# Patient Record
Sex: Male | Born: 1945
Health system: Southern US, Community
[De-identification: ages and names within clinical notes are randomized; demographics above are authoritative.]

## PROBLEM LIST (undated history)

## (undated) DIAGNOSIS — N400 Enlarged prostate without lower urinary tract symptoms: Secondary | ICD-10-CM

## (undated) DIAGNOSIS — H3530 Unspecified macular degeneration: Secondary | ICD-10-CM

## (undated) DIAGNOSIS — M109 Gout, unspecified: Secondary | ICD-10-CM

## (undated) DIAGNOSIS — E785 Hyperlipidemia, unspecified: Secondary | ICD-10-CM

## (undated) DIAGNOSIS — T8859XA Other complications of anesthesia, initial encounter: Secondary | ICD-10-CM

## (undated) DIAGNOSIS — J302 Other seasonal allergic rhinitis: Secondary | ICD-10-CM

## (undated) DIAGNOSIS — R339 Retention of urine, unspecified: Secondary | ICD-10-CM

## (undated) DIAGNOSIS — K219 Gastro-esophageal reflux disease without esophagitis: Secondary | ICD-10-CM

## (undated) DIAGNOSIS — I2699 Other pulmonary embolism without acute cor pulmonale: Secondary | ICD-10-CM

## (undated) DIAGNOSIS — M069 Rheumatoid arthritis, unspecified: Secondary | ICD-10-CM

## (undated) DIAGNOSIS — T4145XA Adverse effect of unspecified anesthetic, initial encounter: Secondary | ICD-10-CM

## (undated) DIAGNOSIS — M1990 Unspecified osteoarthritis, unspecified site: Secondary | ICD-10-CM

## (undated) DIAGNOSIS — K589 Irritable bowel syndrome without diarrhea: Secondary | ICD-10-CM

## (undated) DIAGNOSIS — I10 Essential (primary) hypertension: Secondary | ICD-10-CM

## (undated) HISTORY — PX: ADENOIDECTOMY: SUR15

## (undated) HISTORY — PX: APPENDECTOMY: SHX54

## (undated) HISTORY — PX: OTHER SURGICAL HISTORY: SHX169

## (undated) HISTORY — PX: TONSILLECTOMY: SUR1361

## (undated) HISTORY — PX: LUMBAR LAMINECTOMY: SHX95

## (undated) HISTORY — DX: Unspecified osteoarthritis, unspecified site: M19.90

## (undated) HISTORY — DX: Gout, unspecified: M10.9

## (undated) HISTORY — DX: Gastro-esophageal reflux disease without esophagitis: K21.9

## (undated) HISTORY — DX: Benign prostatic hyperplasia without lower urinary tract symptoms: N40.0

## (undated) HISTORY — DX: Hyperlipidemia, unspecified: E78.5

## (undated) HISTORY — DX: Unspecified macular degeneration: H35.30

## (undated) HISTORY — DX: Rheumatoid arthritis, unspecified: M06.9

## (undated) HISTORY — PX: CHOLECYSTECTOMY: SHX55

---

## 1998-06-29 ENCOUNTER — Ambulatory Visit (HOSPITAL_BASED_OUTPATIENT_CLINIC_OR_DEPARTMENT_OTHER): Admission: RE | Admit: 1998-06-29 | Discharge: 1998-06-29 | Payer: Self-pay | Admitting: General Surgery

## 1998-10-19 ENCOUNTER — Other Ambulatory Visit: Admission: RE | Admit: 1998-10-19 | Discharge: 1998-10-19 | Payer: Self-pay | Admitting: Gastroenterology

## 1998-12-13 ENCOUNTER — Encounter: Payer: Self-pay | Admitting: Orthopedic Surgery

## 1998-12-13 ENCOUNTER — Ambulatory Visit (HOSPITAL_COMMUNITY): Admission: RE | Admit: 1998-12-13 | Discharge: 1998-12-13 | Payer: Self-pay | Admitting: Orthopedic Surgery

## 1998-12-27 ENCOUNTER — Encounter: Payer: Self-pay | Admitting: Orthopedic Surgery

## 1998-12-27 ENCOUNTER — Ambulatory Visit (HOSPITAL_COMMUNITY): Admission: RE | Admit: 1998-12-27 | Discharge: 1998-12-27 | Payer: Self-pay | Admitting: Orthopedic Surgery

## 2000-01-15 ENCOUNTER — Encounter: Payer: Self-pay | Admitting: Emergency Medicine

## 2000-01-15 ENCOUNTER — Emergency Department (HOSPITAL_COMMUNITY): Admission: EM | Admit: 2000-01-15 | Discharge: 2000-01-15 | Payer: Self-pay | Admitting: Emergency Medicine

## 2000-04-14 ENCOUNTER — Ambulatory Visit (HOSPITAL_COMMUNITY): Admission: RE | Admit: 2000-04-14 | Discharge: 2000-04-14 | Payer: Self-pay | Admitting: *Deleted

## 2001-04-13 ENCOUNTER — Ambulatory Visit (HOSPITAL_COMMUNITY): Admission: RE | Admit: 2001-04-13 | Discharge: 2001-04-13 | Payer: Self-pay | Admitting: Urology

## 2001-04-13 ENCOUNTER — Encounter: Payer: Self-pay | Admitting: Urology

## 2001-04-24 ENCOUNTER — Emergency Department (HOSPITAL_COMMUNITY): Admission: EM | Admit: 2001-04-24 | Discharge: 2001-04-24 | Payer: Self-pay | Admitting: Emergency Medicine

## 2001-09-30 ENCOUNTER — Encounter: Payer: Self-pay | Admitting: Urology

## 2001-09-30 ENCOUNTER — Ambulatory Visit (HOSPITAL_COMMUNITY): Admission: RE | Admit: 2001-09-30 | Discharge: 2001-09-30 | Payer: Self-pay | Admitting: Urology

## 2002-02-17 ENCOUNTER — Ambulatory Visit (HOSPITAL_COMMUNITY): Admission: RE | Admit: 2002-02-17 | Discharge: 2002-02-17 | Payer: Self-pay | Admitting: Urology

## 2002-02-17 ENCOUNTER — Encounter: Payer: Self-pay | Admitting: Urology

## 2002-05-31 ENCOUNTER — Encounter: Admission: RE | Admit: 2002-05-31 | Discharge: 2002-05-31 | Payer: Self-pay | Admitting: *Deleted

## 2004-09-05 ENCOUNTER — Ambulatory Visit: Payer: Self-pay | Admitting: Family Medicine

## 2005-02-04 ENCOUNTER — Ambulatory Visit: Payer: Self-pay | Admitting: Family Medicine

## 2005-07-10 ENCOUNTER — Ambulatory Visit: Payer: Self-pay | Admitting: Family Medicine

## 2005-09-04 ENCOUNTER — Ambulatory Visit: Payer: Self-pay | Admitting: Family Medicine

## 2005-11-12 ENCOUNTER — Ambulatory Visit: Payer: Self-pay | Admitting: Family Medicine

## 2006-01-01 ENCOUNTER — Ambulatory Visit: Payer: Self-pay | Admitting: Family Medicine

## 2006-03-04 ENCOUNTER — Ambulatory Visit: Payer: Self-pay | Admitting: Family Medicine

## 2006-06-09 ENCOUNTER — Ambulatory Visit: Payer: Self-pay | Admitting: Family Medicine

## 2006-07-07 ENCOUNTER — Ambulatory Visit: Payer: Self-pay | Admitting: Family Medicine

## 2006-09-24 ENCOUNTER — Ambulatory Visit: Payer: Self-pay | Admitting: Family Medicine

## 2006-09-24 LAB — CONVERTED CEMR LAB
Chol/HDL Ratio, serum: 3.2
Cholesterol: 147 mg/dL (ref 0–200)
Creatinine, Ser: 1.6 mg/dL — ABNORMAL HIGH (ref 0.4–1.5)
Glucose, Bld: 88 mg/dL (ref 70–99)
HDL: 45.3 mg/dL (ref >39.0)
Hemoglobin: 16.2 g/dL (ref 13.0–17.0)
Monocytes Absolute: 0.7 10*3/uL (ref 0.2–0.7)
PSA: 1.91 ng/mL (ref 0.10–4.00)
Platelets: 228 10*3/uL (ref 150–400)
Sodium: 140 meq/L (ref 135–145)
TSH: 3.59 microintl units/mL (ref 0.35–5.50)
VLDL: 33 mg/dL (ref 0–40)

## 2007-02-23 ENCOUNTER — Ambulatory Visit: Payer: Self-pay | Admitting: Family Medicine

## 2007-04-28 ENCOUNTER — Encounter: Payer: Self-pay | Admitting: Family Medicine

## 2007-05-10 ENCOUNTER — Ambulatory Visit (HOSPITAL_COMMUNITY): Admission: RE | Admit: 2007-05-10 | Discharge: 2007-05-10 | Payer: Self-pay | Admitting: Urology

## 2007-05-31 ENCOUNTER — Encounter (INDEPENDENT_AMBULATORY_CARE_PROVIDER_SITE_OTHER): Payer: Self-pay

## 2007-06-25 ENCOUNTER — Encounter: Payer: Self-pay | Admitting: Family Medicine

## 2007-07-13 ENCOUNTER — Telehealth: Payer: Self-pay | Admitting: Family Medicine

## 2007-08-25 ENCOUNTER — Telehealth: Payer: Self-pay | Admitting: Family Medicine

## 2007-09-07 ENCOUNTER — Ambulatory Visit: Payer: Self-pay | Admitting: Family Medicine

## 2007-09-07 DIAGNOSIS — M129 Arthropathy, unspecified: Secondary | ICD-10-CM | POA: Insufficient documentation

## 2007-09-07 DIAGNOSIS — T50995A Adverse effect of other drugs, medicaments and biological substances, initial encounter: Secondary | ICD-10-CM | POA: Insufficient documentation

## 2007-09-07 DIAGNOSIS — E785 Hyperlipidemia, unspecified: Secondary | ICD-10-CM | POA: Insufficient documentation

## 2007-09-07 DIAGNOSIS — E039 Hypothyroidism, unspecified: Secondary | ICD-10-CM | POA: Insufficient documentation

## 2007-09-24 ENCOUNTER — Ambulatory Visit: Payer: Self-pay | Admitting: Family Medicine

## 2007-09-28 ENCOUNTER — Ambulatory Visit: Payer: Self-pay | Admitting: Family Medicine

## 2007-09-28 DIAGNOSIS — K219 Gastro-esophageal reflux disease without esophagitis: Secondary | ICD-10-CM | POA: Insufficient documentation

## 2007-10-24 LAB — CONVERTED CEMR LAB
AST: 23 units/L (ref 0–37)
Albumin: 4.5 g/dL (ref 3.5–5.2)
Basophils Relative: 0 % (ref 0.0–1.0)
Chloride: 103 meq/L (ref 96–112)
Cholesterol: 134 mg/dL (ref 0–200)
Creatinine, Ser: 1.8 mg/dL — ABNORMAL HIGH (ref 0.4–1.5)
GFR calc Af Amer: 49 mL/min
Glucose, Bld: 83 mg/dL (ref 70–99)
HCT: 51.3 % (ref 39.0–52.0)
HDL: 50 mg/dL (ref >39.0)
LDL Cholesterol: 61 mg/dL (ref 0–99)
Monocytes Absolute: 0.6 10*3/uL (ref 0.2–0.7)
Monocytes Relative: 7.8 % (ref 3.0–11.0)
Platelets: 183 10*3/uL (ref 150–400)
RBC: 5.71 M/uL (ref 4.22–5.81)
Sodium: 141 meq/L (ref 135–145)
TSH: 1.85 microintl units/mL (ref 0.35–5.50)
Total Bilirubin: 2.2 mg/dL — ABNORMAL HIGH (ref 0.3–1.2)
Total CHOL/HDL Ratio: 2.7
Total Protein: 7 g/dL (ref 6.0–8.3)
Triglycerides: 115 mg/dL (ref 0–149)
WBC: 8 10*3/uL (ref 4.5–10.5)

## 2007-10-28 ENCOUNTER — Telehealth: Payer: Self-pay | Admitting: Family Medicine

## 2007-11-30 ENCOUNTER — Telehealth: Payer: Self-pay | Admitting: Family Medicine

## 2007-12-15 ENCOUNTER — Ambulatory Visit: Payer: Self-pay | Admitting: Family Medicine

## 2007-12-15 DIAGNOSIS — G4700 Insomnia, unspecified: Secondary | ICD-10-CM | POA: Insufficient documentation

## 2007-12-15 DIAGNOSIS — J45909 Unspecified asthma, uncomplicated: Secondary | ICD-10-CM | POA: Insufficient documentation

## 2008-02-09 ENCOUNTER — Ambulatory Visit: Payer: Self-pay | Admitting: Family Medicine

## 2008-02-29 ENCOUNTER — Telehealth: Payer: Self-pay | Admitting: Family Medicine

## 2008-03-22 ENCOUNTER — Telehealth: Payer: Self-pay | Admitting: Family Medicine

## 2008-04-03 ENCOUNTER — Encounter: Payer: Self-pay | Admitting: Family Medicine

## 2008-04-12 ENCOUNTER — Ambulatory Visit: Payer: Self-pay | Admitting: Family Medicine

## 2008-05-30 ENCOUNTER — Ambulatory Visit: Payer: Self-pay | Admitting: Family Medicine

## 2008-05-30 DIAGNOSIS — Z87442 Personal history of urinary calculi: Secondary | ICD-10-CM | POA: Insufficient documentation

## 2008-06-13 ENCOUNTER — Telehealth: Payer: Self-pay | Admitting: Family Medicine

## 2008-07-20 ENCOUNTER — Ambulatory Visit: Payer: Self-pay | Admitting: Family Medicine

## 2008-07-20 DIAGNOSIS — E291 Testicular hypofunction: Secondary | ICD-10-CM | POA: Insufficient documentation

## 2008-09-14 ENCOUNTER — Ambulatory Visit: Payer: Self-pay | Admitting: Family Medicine

## 2008-09-14 DIAGNOSIS — M549 Dorsalgia, unspecified: Secondary | ICD-10-CM | POA: Insufficient documentation

## 2008-09-20 ENCOUNTER — Encounter: Payer: Self-pay | Admitting: Family Medicine

## 2008-09-27 ENCOUNTER — Ambulatory Visit: Payer: Self-pay | Admitting: Family Medicine

## 2008-10-24 ENCOUNTER — Telehealth: Payer: Self-pay | Admitting: Family Medicine

## 2008-10-31 ENCOUNTER — Ambulatory Visit: Payer: Self-pay | Admitting: Family Medicine

## 2008-10-31 DIAGNOSIS — H109 Unspecified conjunctivitis: Secondary | ICD-10-CM | POA: Insufficient documentation

## 2008-11-09 ENCOUNTER — Telehealth: Payer: Self-pay | Admitting: Family Medicine

## 2008-12-07 ENCOUNTER — Telehealth: Payer: Self-pay | Admitting: Family Medicine

## 2008-12-21 ENCOUNTER — Ambulatory Visit: Payer: Self-pay | Admitting: Family Medicine

## 2009-01-02 ENCOUNTER — Telehealth: Payer: Self-pay | Admitting: Family Medicine

## 2009-01-08 ENCOUNTER — Encounter: Payer: Self-pay | Admitting: Family Medicine

## 2009-01-09 ENCOUNTER — Telehealth: Payer: Self-pay | Admitting: Family Medicine

## 2009-02-01 ENCOUNTER — Ambulatory Visit: Payer: Self-pay | Admitting: Family Medicine

## 2009-02-21 ENCOUNTER — Telehealth: Payer: Self-pay | Admitting: Family Medicine

## 2009-03-08 ENCOUNTER — Telehealth: Payer: Self-pay | Admitting: Family Medicine

## 2009-03-13 ENCOUNTER — Ambulatory Visit: Payer: Self-pay | Admitting: Family Medicine

## 2009-03-13 DIAGNOSIS — F411 Generalized anxiety disorder: Secondary | ICD-10-CM | POA: Insufficient documentation

## 2009-03-13 DIAGNOSIS — I10 Essential (primary) hypertension: Secondary | ICD-10-CM | POA: Insufficient documentation

## 2009-03-14 ENCOUNTER — Telehealth: Payer: Self-pay | Admitting: Family Medicine

## 2009-03-14 ENCOUNTER — Encounter: Payer: Self-pay | Admitting: Family Medicine

## 2009-03-21 ENCOUNTER — Ambulatory Visit: Payer: Self-pay | Admitting: Family Medicine

## 2009-04-11 ENCOUNTER — Telehealth: Payer: Self-pay | Admitting: Family Medicine

## 2009-04-18 ENCOUNTER — Ambulatory Visit: Payer: Self-pay | Admitting: Family Medicine

## 2009-05-02 ENCOUNTER — Telehealth (INDEPENDENT_AMBULATORY_CARE_PROVIDER_SITE_OTHER): Payer: Self-pay | Admitting: *Deleted

## 2009-05-09 ENCOUNTER — Telehealth: Payer: Self-pay | Admitting: Family Medicine

## 2009-05-31 ENCOUNTER — Ambulatory Visit: Payer: Self-pay | Admitting: Family Medicine

## 2009-05-31 DIAGNOSIS — M19079 Primary osteoarthritis, unspecified ankle and foot: Secondary | ICD-10-CM | POA: Insufficient documentation

## 2009-06-04 ENCOUNTER — Telehealth (INDEPENDENT_AMBULATORY_CARE_PROVIDER_SITE_OTHER): Payer: Self-pay | Admitting: *Deleted

## 2009-06-12 ENCOUNTER — Encounter: Payer: Self-pay | Admitting: Family Medicine

## 2009-06-28 ENCOUNTER — Ambulatory Visit: Payer: Self-pay | Admitting: Family Medicine

## 2009-07-31 ENCOUNTER — Telehealth: Payer: Self-pay | Admitting: Family Medicine

## 2009-08-04 ENCOUNTER — Encounter: Payer: Self-pay | Admitting: Family Medicine

## 2009-09-06 ENCOUNTER — Telehealth: Payer: Self-pay | Admitting: Family Medicine

## 2009-09-11 ENCOUNTER — Ambulatory Visit: Payer: Self-pay | Admitting: Family Medicine

## 2009-10-06 HISTORY — PX: HERNIA REPAIR: SHX51

## 2009-11-20 ENCOUNTER — Telehealth: Payer: Self-pay | Admitting: Family Medicine

## 2009-11-27 ENCOUNTER — Ambulatory Visit: Payer: Self-pay | Admitting: Family Medicine

## 2009-11-27 DIAGNOSIS — M949 Disorder of cartilage, unspecified: Secondary | ICD-10-CM

## 2009-11-27 DIAGNOSIS — M899 Disorder of bone, unspecified: Secondary | ICD-10-CM | POA: Insufficient documentation

## 2009-11-29 ENCOUNTER — Telehealth (INDEPENDENT_AMBULATORY_CARE_PROVIDER_SITE_OTHER): Payer: Self-pay | Admitting: *Deleted

## 2009-12-12 ENCOUNTER — Encounter: Payer: Self-pay | Admitting: Family Medicine

## 2009-12-13 ENCOUNTER — Telehealth: Payer: Self-pay | Admitting: Family Medicine

## 2009-12-26 ENCOUNTER — Ambulatory Visit: Payer: Self-pay | Admitting: Family Medicine

## 2009-12-27 LAB — CONVERTED CEMR LAB
CO2: 30 meq/L (ref 19–32)
Calcium: 9.4 mg/dL (ref 8.4–10.5)
Glucose, Bld: 91 mg/dL (ref 70–99)
Potassium: 4.4 meq/L (ref 3.5–5.1)

## 2010-01-01 ENCOUNTER — Telehealth: Payer: Self-pay | Admitting: Family Medicine

## 2010-01-02 ENCOUNTER — Encounter: Payer: Self-pay | Admitting: Family Medicine

## 2010-01-23 ENCOUNTER — Telehealth: Payer: Self-pay | Admitting: Family Medicine

## 2010-01-31 ENCOUNTER — Telehealth: Payer: Self-pay | Admitting: Family Medicine

## 2010-02-12 ENCOUNTER — Ambulatory Visit: Payer: Self-pay | Admitting: Family Medicine

## 2010-02-12 DIAGNOSIS — R0789 Other chest pain: Secondary | ICD-10-CM | POA: Insufficient documentation

## 2010-02-12 DIAGNOSIS — M479 Spondylosis, unspecified: Secondary | ICD-10-CM | POA: Insufficient documentation

## 2010-04-02 ENCOUNTER — Ambulatory Visit: Payer: Self-pay | Admitting: Family Medicine

## 2010-04-02 DIAGNOSIS — K5900 Constipation, unspecified: Secondary | ICD-10-CM | POA: Insufficient documentation

## 2010-04-02 DIAGNOSIS — IMO0001 Reserved for inherently not codable concepts without codable children: Secondary | ICD-10-CM | POA: Insufficient documentation

## 2010-04-02 DIAGNOSIS — M791 Myalgia, unspecified site: Secondary | ICD-10-CM | POA: Insufficient documentation

## 2010-04-05 ENCOUNTER — Encounter: Payer: Self-pay | Admitting: Family Medicine

## 2010-04-11 ENCOUNTER — Ambulatory Visit: Payer: Self-pay | Admitting: Family Medicine

## 2010-04-11 DIAGNOSIS — J309 Allergic rhinitis, unspecified: Secondary | ICD-10-CM | POA: Insufficient documentation

## 2010-04-11 DIAGNOSIS — E559 Vitamin D deficiency, unspecified: Secondary | ICD-10-CM | POA: Insufficient documentation

## 2010-04-11 DIAGNOSIS — E299 Testicular dysfunction, unspecified: Secondary | ICD-10-CM | POA: Insufficient documentation

## 2010-04-11 LAB — CONVERTED CEMR LAB
Bilirubin Urine: NEGATIVE
Glucose, Urine, Semiquant: NEGATIVE
Ketones, urine, test strip: NEGATIVE
Nitrite: NEGATIVE
Protein, U semiquant: NEGATIVE
Specific Gravity, Urine: 1.02
Urobilinogen, UA: 0.2
pH: 6

## 2010-04-18 LAB — CONVERTED CEMR LAB
PSA: 2.05 ng/mL (ref 0.10–4.00)
Testosterone: 212.93 ng/dL — ABNORMAL LOW (ref 350.00–890.00)
Uric Acid, Serum: 7.4 mg/dL (ref 4.0–7.8)
Vit D, 25-Hydroxy: 38 ng/mL (ref 30–89)

## 2010-04-23 ENCOUNTER — Telehealth: Payer: Self-pay | Admitting: Family Medicine

## 2010-05-16 ENCOUNTER — Ambulatory Visit: Payer: Self-pay | Admitting: Family Medicine

## 2010-05-16 DIAGNOSIS — H1030 Unspecified acute conjunctivitis, unspecified eye: Secondary | ICD-10-CM | POA: Insufficient documentation

## 2010-05-16 LAB — CONVERTED CEMR LAB
Bilirubin Urine: NEGATIVE
Nitrite: NEGATIVE

## 2010-05-28 ENCOUNTER — Ambulatory Visit: Payer: Self-pay | Admitting: Family Medicine

## 2010-05-28 DIAGNOSIS — R609 Edema, unspecified: Secondary | ICD-10-CM | POA: Insufficient documentation

## 2010-05-28 DIAGNOSIS — K589 Irritable bowel syndrome without diarrhea: Secondary | ICD-10-CM | POA: Insufficient documentation

## 2010-06-20 ENCOUNTER — Ambulatory Visit: Payer: Self-pay | Admitting: Family Medicine

## 2010-06-20 LAB — CONVERTED CEMR LAB
Bilirubin Urine: NEGATIVE
Glucose, Urine, Semiquant: NEGATIVE
Protein, U semiquant: NEGATIVE
WBC Urine, dipstick: NEGATIVE
pH: 5

## 2010-06-28 ENCOUNTER — Encounter: Payer: Self-pay | Admitting: Family Medicine

## 2010-07-23 ENCOUNTER — Ambulatory Visit: Payer: Self-pay | Admitting: Family Medicine

## 2010-07-23 DIAGNOSIS — R351 Nocturia: Secondary | ICD-10-CM | POA: Insufficient documentation

## 2010-07-23 DIAGNOSIS — M25519 Pain in unspecified shoulder: Secondary | ICD-10-CM | POA: Insufficient documentation

## 2010-08-02 ENCOUNTER — Telehealth (INDEPENDENT_AMBULATORY_CARE_PROVIDER_SITE_OTHER): Payer: Self-pay | Admitting: *Deleted

## 2010-08-07 ENCOUNTER — Encounter: Payer: Self-pay | Admitting: Family Medicine

## 2010-09-06 ENCOUNTER — Telehealth: Payer: Self-pay | Admitting: Family Medicine

## 2010-09-12 ENCOUNTER — Encounter: Payer: Self-pay | Admitting: Family Medicine

## 2010-09-19 ENCOUNTER — Telehealth: Payer: Self-pay | Admitting: Family Medicine

## 2010-10-15 ENCOUNTER — Ambulatory Visit
Admission: RE | Admit: 2010-10-15 | Discharge: 2010-10-15 | Payer: Self-pay | Source: Home / Self Care | Attending: Internal Medicine | Admitting: Internal Medicine

## 2010-10-27 ENCOUNTER — Encounter: Payer: Self-pay | Admitting: Family Medicine

## 2010-10-29 ENCOUNTER — Telehealth: Payer: Self-pay | Admitting: Family Medicine

## 2010-11-05 NOTE — Progress Notes (Signed)
Summary: wants to speak with Dr Abner Greenspan  Phone Note Call from Patient Call back at Home Phone [phone]   Caller: Patient---LIVE CALL Reason for Call: Talk to Doctor Summary of Call: wants to speak to Dr Abner Greenspan only. please return call. No further message was given. Initial call taken by: Warnell Forester,  August 02, 2010 2:31 PM  Follow-up for Phone Call        Can you check with patient and see what he needs Follow-up by: Danise Edge MD,  August 02, 2010 3:31 PM  Additional Follow-up for Phone Call Additional follow up Details #1::        Pt states stool is hard with a little blood. Pt states Dr Scotty Court prescribed a stool softner for him and would like to know if something could be called in? Pt also states the patches that were prescribed for him only help for a little while? Pt also states the Rapaflo upsets his stomach? Pt has Flomax at home and would like to know if he can use this instead? Additional Follow-up by: Josph Macho RMA,  August 02, 2010 4:32 PM    Additional Follow-up for Phone Call Additional follow up Details #2::    He needs to come in to discuss so many things for the hard stool, OTC Senna S 2 caps daily. Add Benefiber powder 2 tsp by mouth daily and may use Anusol HC supp, OTC two times a day whenever he sees blood or has  hard pain ful stool Follow-up by: Danise Edge MD,  August 02, 2010 5:13 PM  Additional Follow-up for Phone Call Additional follow up Details #3:: Details for Additional Follow-up Action Taken: Left vm for pt to return my call. Additional Follow-up by: Josph Macho RMA,  August 05, 2010 9:04 AM  Pt informed and pt would like to know if any md in the office would give him a lidocaine and depo injection in his shoulder? Pt states he has an Ortho MD but it might take 2 weeks to get into there? After speaking with Tim Lair its better if pt just sees his Ortho MD for this.  Pt informed and understands/ CF

## 2010-11-05 NOTE — Progress Notes (Signed)
Summary: rx lorazepam   Phone Note From Pharmacy   [facility] [phone]* Reason for Call: Needs renewal Summary of Call: refill lorazepam  Initial call taken by: Pura Spice, RN,  January 23, 2010 3:38 PM  Follow-up for Phone Call        ok x 5  per dr Scotty Court  Follow-up by: Pura Spice, RN,  January 23, 2010 3:38 PM    New/Updated Medications: LORAZEPAM 0.5 MG TABS (LORAZEPAM) 1 three times a day for stress Prescriptions: LORAZEPAM 0.5 MG TABS (LORAZEPAM) 1 three times a day for stress  #90 x 5   Entered by:   Pura Spice, RN   Authorized by:   Judithann Sheen MD   Signed by:   Pura Spice, RN on 01/23/2010   Method used:   Telephoned to ...       [facility]  [address] [phone]* (retail)       [address]       [place], [place]  [address]       Ph: [phone] or [phone]       Fax: [phone]   RxID:   [id number]

## 2010-11-05 NOTE — Letter (Signed)
Summary: [facility]   Imported By: Lanelle Bal 08/16/2010 09:11:21  _____________________________________________________________________  External Attachment:    Type:   Image     Comment:   External Document

## 2010-11-05 NOTE — Letter (Signed)
Summary: [facility]  [facility]   Imported By: Maryln Gottron 07/10/2010 10:04:13  _____________________________________________________________________  External Attachment:    Type:   Image     Comment:   External Document

## 2010-11-05 NOTE — Assessment & Plan Note (Signed)
Summary: BILATERAL LEG PAIN // RS   Vital Signs:  Patient profile:   65 year old male Weight:      199 pounds O2 Sat:      98 % Temp:     98 degrees F Pulse rate:   88 / minute Pulse rhythm:   regular BP sitting:   130 / 80  (left arm)  Vitals Entered By: Pura Spice, RN (May 28, 2010 11:42 AM) CC: bilateral leg pain thinks his ankles swelling.    History of Present Illness: This 65 year old white married male is in today complaining of pain in his lower back his hips knees and I do over the past week he went on a vacation to the [place]. [place] and [place] and a considerable amount of walking have a great time but also in began having some pain of the joints His other complaint is that of episodes of diarrhea on a rather frequent and had hyoscymine in the past which has helped to control his problem Desire for a prescription for Lunesta 3 mg for insomnia Does have some peripheral edema  Allergies: 1)  ! Sudafed 2)  ! Codeine 3)  ! Zocor 4)  ! Demerol 5)  ! Caffeine  Past History:  Past Medical History: Last updated: 09/27/2008 arthritis Asthma GERD Hyperlipidemia Urinary incontinence BPH  ekg   2009 eye exam   2009 colonscopy   2005  repeat  2015   DT     2009 PNEUNOVAX SHINGLES VACCINE SMOKER  Never       Specialist: Dr Tammy Sours Dr Retta Diones  Past Surgical History: Last updated: 12/15/2007 Appendectomy Cholecystectomy Lumbar laminectomy Tonsillectomy  Review of Systems      See HPI  The patient denies anorexia, fever, weight loss, weight gain, vision loss, decreased hearing, hoarseness, chest pain, syncope, dyspnea on exertion, peripheral edema, prolonged cough, headaches, hemoptysis, abdominal pain, melena, hematochezia, severe indigestion/heartburn, hematuria, incontinence, genital sores, muscle weakness, suspicious skin lesions, transient blindness, difficulty walking, depression, unusual weight change, abnormal bleeding, enlarged lymph  nodes, angioedema, breast masses, and testicular masses.    Physical Exam  General:  Well-developed,well-nourished,in no acute distress; alert,appropriate and cooperative throughout examination Lungs:  Normal respiratory effort, chest expands symmetrically. Lungs are clear to auscultation, no crackles or wheezes. Heart:  Normal rate and regular rhythm. S1 and S2 normal without gallop, murmur, click, rub or other extra sounds. Abdomen:  marked tenderness over the abdomen with increased bowel sounds Msk:  tenderness over both heels tenderness of the left knee and both ankles bilaterally Extremities:  left pretibial edema and right pretibial edema.     Impression & Recommendations:  Problem # 1:  PERIPHERAL EDEMA (ICD-782.3) Assessment New  His updated medication list for this problem includes:    Maxzide 75-50 Mg Tabs (Triamterene-hctz) .Marland Kitchen... Take 1 stat then 1 by mouth each day  Problem # 2:  ARTHRITIS (ICD-716.90) Assessment: Deteriorated Celebrex 200 mg b.i.d.  Problem # 3:  HYPERTENSION, BENIGN ESSENTIAL (ICD-401.1) Assessment: Improved  His updated medication list for this problem includes:    Cardura 2 Mg Tabs (Doxazosin mesylate) .Marland Kitchen... 1 by mouth once daily.    Maxzide 75-50 Mg Tabs (Triamterene-hctz) .Marland Kitchen... Take 1 stat then 1 by mouth each day  Problem # 4:  INSOMNIA (ICD-780.52) Assessment: New  His updated medication list for this problem includes:    Triazolam 0.25 Mg Tabs (Triazolam) .Marland Kitchen... 1 hs for sleep    Lunesta 3 Mg Tabs (Eszopiclone) .Marland Kitchen... 1 hs  for sleep  Problem # 5:  IBS (ICD-564.1) Assessment: New Hyoscyamine 1 bid Orders: Prescription Created Electronically [id number])  Complete Medication List: 1)  Omeprazole 20 Mg Cpdr (Omeprazole) .... Take 1 capsule by mouth once a day 2)  Cardura 2 Mg Tabs (Doxazosin mesylate) .Marland Kitchen.. 1 by mouth once daily. 3)  Triamcinolone Acetonide 0.5 % Crea (Triamcinolone acetonide) .... Apply two times a day 4)  Testim 1 % Gel  (Testosterone) .... Apply i tube daily 5)  Prochlorperazine Maleate 10 Mg Tabs (Prochlorperazine maleate) .Marland Kitchen.. 1 by mouth every 6 hrs as needed nausea 6)  Fluticasone Propionate 50 Mcg/act Susp (Fluticasone propionate) .... 2 sprays eaach nostril once daily 7)  Triazolam 0.25 Mg Tabs (Triazolam) .Marland Kitchen.. 1 hs for sleep 8)  Lorazepam 0.5 Mg Tabs (Lorazepam) .Marland Kitchen.. 1 three times a day for stress 9)  Vitamin D (ergocalciferol) 50000 Unit Caps (Ergocalciferol) .Marland KitchenMarland KitchenMarland Kitchen 1 weekly x 12 weeks 10)  Maxzide 75-50 Mg Tabs (Triamterene-hctz) .... Take 1 stat then 1 by mouth each day 11)  Sedapap 50-650 Mg Tabs (Butalbital-acetaminophen) .Marland Kitchen.. 1-2 q4h as needed headache, does not have caffeine 12)  Xyzal 5 Mg Tabs (Levocetirizine dihydrochloride) .Marland Kitchen.. 1 qd 13)  Proair Hfa 108 (90 Base) Mcg/act Aers (Albuterol sulfate) .... 2 inhalations three times a day as needed wheezing 14)  Oxycodone-acetaminophen 10-650 Mg Tabs (Oxycodone-acetaminophen) .Marland Kitchen.. 1 q4h as needed paoin not over 4 per day, take 2 senna at night 15)  Senokot 8.6 Mg Tabs (Sennosides) .... 2-4 h.s. p.r.n. for constipation 16)  Hydrocodone-acetaminophen 10-325 Mg Tabs (Hydrocodone-acetaminophen) .Marland Kitchen.. 1 q4h  as needed for pain 17)  Tobradex 0.3-0.1 % Susp (Tobramycin-dexamethasone) .Marland Kitchen.. 1 drop each eye tid 18)  Hyoscyamine Sulfate Cr 0.375 Mg Xr12h-tab (Hyoscyamine sulfate) .Marland Kitchen.. 1 two times a day for irritable bowel 19)  Celebrex 200 Mg Caps (Celecoxib) .Marland Kitchen.. 1 two times a day for arthritis 20)  Lunesta 3 Mg Tabs (Eszopiclone) .Marland Kitchen.. 1 hs for sleep  Patient Instructions: 1)  4 irritable bowel syndrome take medication as prescribed and it would be advantageous to take a Align a probe I 2)  Celebrex 200 mg twice daily for arthritis 3)  Take Maxzide as prescribed for edema Prescriptions: LUNESTA 3 MG TABS (ESZOPICLONE) 1 hs for sleep  #30 x 5   Entered and Authorized by:   Judithann Sheen MD   Signed by:   Judithann Sheen MD on 05/28/2010   Method  used:   Print then Give to Patient   RxID:   [id number] HYOSCYAMINE SULFATE CR 0.375 MG XR12H-TAB (HYOSCYAMINE SULFATE) 1 two times a day for irritable bowel  #60 x 11   Entered and Authorized by:   Judithann Sheen MD   Signed by:   Judithann Sheen MD on 05/28/2010   Method used:   Print then Give to Patient   RxID:   [id number]

## 2010-11-05 NOTE — Progress Notes (Signed)
  Called to see if patient came to lab on 11-27-09.

## 2010-11-05 NOTE — Assessment & Plan Note (Signed)
Summary: review of problems and fasting labs per pt instruction sheet/njr   Vital Signs:  Patient profile:   65 year old male Weight:      195 pounds O2 Sat:      98 % Temp:     98.2 degrees F Pulse rate:   90 / minute Pulse rhythm:   regular BP sitting:   140 / 82  (left arm)  Vitals Entered By: Pura Spice, RN (April 11, 2010 11:14 AM) CC: go over problems refills  fasting for labs  Is Patient Diabetic? No   History of Present Illness: This 65 year old white married male with history of gallop chronic problem with myositis of the left trapezius muscle was seen 28 GU and hand injection in the trigger of the left trapezius He relates is felt considerable He continues to complain of fatigue decreased libido despite the fact he is on Testim 1% but has not been applying it daily. Continues to complain of arthritic pain of his knees and ankle He is under care of Dr. Retta Diones , urologist regarding his prostate as well as urinary frequency urgency and history of renal stone Continues to complain of nasal congestion with drainage as well as some facial discomfort Colonoscopic exam good until 2014. Continues to have some symptoms of GERD Needs refill medications To have lab studies other than the ones done in  March 2011, to repeat his testosterone level  Allergies: 1)  ! Sudafed 2)  ! Codeine 3)  ! Zocor 4)  ! Demerol 5)  ! Caffeine  Past History:  Past Medical History: Last updated: 09/27/2008 arthritis Asthma GERD Hyperlipidemia Urinary incontinence BPH  ekg   2009 eye exam   2009 colonscopy   2005  repeat  2015   DT     2009 PNEUNOVAX SHINGLES VACCINE SMOKER  Never       Specialist: Dr Tammy Sours Dr Retta Diones  Past Surgical History: Last updated: 12/15/2007 Appendectomy Cholecystectomy Lumbar laminectomy Tonsillectomy  Review of Systems      See HPI  The patient denies anorexia, fever, weight loss, weight gain, vision loss, decreased hearing,  hoarseness, chest pain, syncope, dyspnea on exertion, peripheral edema, prolonged cough, headaches, hemoptysis, abdominal pain, melena, hematochezia, severe indigestion/heartburn, hematuria, incontinence, genital sores, muscle weakness, suspicious skin lesions, transient blindness, difficulty walking, depression, unusual weight change, abnormal bleeding, enlarged lymph nodes, angioedema, breast masses, and testicular masses.    Physical Exam  General:  Well-developed,well-nourished,in no acute distress; alert,appropriate and cooperative throughout examination Head:  Normocephalic and atraumatic without obvious abnormalities. No apparent alopecia or balding. Eyes:  No corneal or conjunctival inflammation noted. EOMI. Perrla. Funduscopic exam benign, without hemorrhages, exudates or papilledema. Vision grossly normal. Ears:  External ear exam shows no significant lesions or deformities.  Otoscopic examination reveals clear canals, tympanic membranes are intact bilaterally without bulging, retraction, inflammation or discharge. Hearing is grossly normal bilaterally. Nose:  nasal congestion with boggy pale swollen nasal mucosa with clear drainage Mouth:  Oral mucosa and oropharynx without lesions or exudates.  Teeth in good repair. Neck:  No deformities, masses, or tenderness noted. Chest Wall:  No deformities, masses, tenderness or gynecomastia noted. Breasts:  No masses or gynecomastia noted Lungs:  Normal respiratory effort, chest expands symmetrically. Lungs are clear to auscultation, no crackles or wheezes. Heart:  Normal rate and regular rhythm. S1 and S2 normal without gallop, murmur, click, rub or other extra sounds. Abdomen:  Bowel sounds positive,abdomen soft and non-tender without masses, organomegaly or hernias  noted. Rectal:  not examined, urologist examined Msk:  tenderness medial and lateral aspect of the right knee as well as both cycle Pulses:  R and L carotid,radial,femoral,dorsalis  pedis and posterior tibial pulses are full and equal bilaterally Extremities:  No clubbing, cyanosis, edema, or deformity noted with normal full range of motion of all joints.   Neurologic:  No cranial nerve deficits noted. Station and gait are normal. Plantar reflexes are down-going bilaterally. DTRs are symmetrical throughout. Sensory, motor and coordinative functions appear intact. Skin:  Intact without suspicious lesions or rashes Cervical Nodes:  No lymphadenopathy noted Axillary Nodes:  No palpable lymphadenopathy Inguinal Nodes:  No significant adenopathy   Impression & Recommendations:  Problem # 1:  RHINITIS (ICD-477.9) Assessment Deteriorated  The following medications were removed from the medication list:    Fexofenadine Hcl 60 Mg Tabs (Fexofenadine hcl) .Marland Kitchen... 1 by mouth once daily. His updated medication list for this problem includes:    Fluticasone Propionate 50 Mcg/act Susp (Fluticasone propionate) .Marland Kitchen... 2 sprays eaach nostril once daily    Xyzal 5 Mg Tabs (Levocetirizine dihydrochloride) .Marland Kitchen... 1 qd  Problem # 2:  FREQUENCY, URINARY (ICD-788.41) Assessment: Unchanged  The following medications were removed from the medication list:    Oxybutynin Chloride 5 Mg Tb24 (Oxybutynin chloride) .Marland Kitchen... Take 1 tablet by mouth once a day His updated medication list for this problem includes:    Cardura 2 Mg Tabs (Doxazosin mesylate) .Marland Kitchen... 1 by mouth once daily.  Orders: UA Dipstick w/o Micro (automated)  (81003)  Problem # 3:  CONSTIPATION (ICD-564.00) Assessment: Improved  His updated medication list for this problem includes:    Senokot 8.6 Mg Tabs (Sennosides) .Marland Kitchen... 2-4 h.s. p.r.n. for constipation  Problem # 4:  MYOSITIS (ICD-729.1) Assessment: Improved  The following medications were removed from the medication list:    Celebrex 200 Mg Caps (Celecoxib) .Marland Kitchen... 1 by mouth once daily    Hydrocodone-acetaminophen 10-500 Mg Tabs (Hydrocodone-acetaminophen) .Marland Kitchen... 1 q4h as  needed pain not over 4 per day His updated medication list for this problem includes:    Sedapap 50-650 Mg Tabs (Butalbital-acetaminophen) .Marland Kitchen... 1-2 q4h as needed headache, does not have caffeine    Oxycodone-acetaminophen 10-650 Mg Tabs (Oxycodone-acetaminophen) .Marland Kitchen... 1 q4h as needed paoin not over 4 per day, take 2 senna at night    Hydrocodone-acetaminophen 10-325 Mg Tabs (Hydrocodone-acetaminophen) .Marland Kitchen... 1 q4h  as needed for pain  Problem # 5:  OSTEOARTHRITIS, ANKLES, BILATERAL (ICD-715.97) Assessment: Unchanged  The following medications were removed from the medication list:    Celebrex 200 Mg Caps (Celecoxib) .Marland Kitchen... 1 by mouth once daily    Hydrocodone-acetaminophen 10-500 Mg Tabs (Hydrocodone-acetaminophen) .Marland Kitchen... 1 q4h as needed pain not over 4 per day His updated medication list for this problem includes:    Sedapap 50-650 Mg Tabs (Butalbital-acetaminophen) .Marland Kitchen... 1-2 q4h as needed headache, does not have caffeine    Oxycodone-acetaminophen 10-650 Mg Tabs (Oxycodone-acetaminophen) .Marland Kitchen... 1 q4h as needed paoin not over 4 per day, take 2 senna at night    Hydrocodone-acetaminophen 10-325 Mg Tabs (Hydrocodone-acetaminophen) .Marland Kitchen... 1 q4h  as needed for pain  Problem # 6:  HYPERTENSION, BENIGN ESSENTIAL (ICD-401.1) Assessment: Improved  The following medications were removed from the medication list:    Amlodipine Besylate 5 Mg Tabs (Amlodipine besylate) .Marland Kitchen... 1 by mouth once daily His updated medication list for this problem includes:    Cardura 2 Mg Tabs (Doxazosin mesylate) .Marland Kitchen... 1 by mouth once daily.    Maxzide 75-50 Mg Tabs (  Triamterene-hctz) .Marland Kitchen... Take 1 stat then 1 by mouth each day  Orders: EKG w/ Interpretation (93000) Prescription Created Electronically [phone])  Problem # 7:  BACK PAIN, CHRONIC (ICD-724.5) Assessment: Improved  The following medications were removed from the medication list:    Celebrex 200 Mg Caps (Celecoxib) .Marland Kitchen... 1 by mouth once daily     Hydrocodone-acetaminophen 10-500 Mg Tabs (Hydrocodone-acetaminophen) .Marland Kitchen... 1 q4h as needed pain not over 4 per day His updated medication list for this problem includes:    Sedapap 50-650 Mg Tabs (Butalbital-acetaminophen) .Marland Kitchen... 1-2 q4h as needed headache, does not have caffeine    Oxycodone-acetaminophen 10-650 Mg Tabs (Oxycodone-acetaminophen) .Marland Kitchen... 1 q4h as needed paoin not over 4 per day, take 2 senna at night    Hydrocodone-acetaminophen 10-325 Mg Tabs (Hydrocodone-acetaminophen) .Marland Kitchen... 1 q4h  as needed for pain  Complete Medication List: 1)  Omeprazole 20 Mg Cpdr (Omeprazole) .... Take 1 capsule by mouth once a day 2)  Cardura 2 Mg Tabs (Doxazosin mesylate) .Marland Kitchen.. 1 by mouth once daily. 3)  Triamcinolone Acetonide 0.5 % Crea (Triamcinolone acetonide) .... Apply two times a day 4)  Testim 1 % Gel (Testosterone) .... Apply i tube daily 5)  Prochlorperazine Maleate 10 Mg Tabs (Prochlorperazine maleate) .Marland Kitchen.. 1 by mouth every 6 hrs as needed nausea 6)  Fluticasone Propionate 50 Mcg/act Susp (Fluticasone propionate) .... 2 sprays eaach nostril once daily 7)  Triazolam 0.25 Mg Tabs (Triazolam) .Marland Kitchen.. 1 hs for sleep 8)  Lorazepam 0.5 Mg Tabs (Lorazepam) .Marland Kitchen.. 1 three times a day for stress 9)  Vitamin D (ergocalciferol) 50000 Unit Caps (Ergocalciferol) .Marland KitchenMarland KitchenMarland Kitchen 1 weekly x 12 weeks 10)  Maxzide 75-50 Mg Tabs (Triamterene-hctz) .... Take 1 stat then 1 by mouth each day 11)  Sedapap 50-650 Mg Tabs (Butalbital-acetaminophen) .Marland Kitchen.. 1-2 q4h as needed headache, does not have caffeine 12)  Xyzal 5 Mg Tabs (Levocetirizine dihydrochloride) .Marland Kitchen.. 1 qd 13)  Proair Hfa 108 (90 Base) Mcg/act Aers (Albuterol sulfate) .... 2 inhalations three times a day as needed wheezing 14)  Vitamin D (ergocalciferol) 50000 Unit Caps (Ergocalciferol) .Marland Kitchen.. 1 by mouth weekly 15)  Prednisone 10 Mg Tabs (Prednisone) .Marland Kitchen.. 1 by mouth once daily 16)  Oxycodone-acetaminophen 10-650 Mg Tabs (Oxycodone-acetaminophen) .Marland Kitchen.. 1 q4h as needed paoin not  over 4 per day, take 2 senna at night 17)  Senokot 8.6 Mg Tabs (Sennosides) .... 2-4 h.s. p.r.n. for constipation 18)  Hydrocodone-acetaminophen 10-325 Mg Tabs (Hydrocodone-acetaminophen) .Marland Kitchen.. 1 q4h  as needed for pain  Other Orders: Venipuncture (44010) T-Vitamin D (25-Hydroxy) (27253-66440) TLB-Uric Acid, Blood (84550-URIC) TLB-PSA (Prostate Specific Antigen) (84153-PSA) TLB-Testosterone, Total (84403-TESTO)  Patient Instructions: 1)  Will call results of laboratory studies when they're returned 2)  Continue medications as prescribed we will multiple medical problems Prescriptions: OMEPRAZOLE 20 MG CPDR (OMEPRAZOLE) Take 1 capsule by mouth once a day  #90 x 3   Entered by:   Pura Spice, RN   Authorized by:   Judithann Sheen MD   Signed by:   Pura Spice, RN on 04/11/2010   Method used:   Faxed to ...       [facility]* (mail-order)       [address]       [place], [place]  [address]       Ph: [phone]       Fax: [phone]   RxID:   [id number] HYDROCODONE-ACETAMINOPHEN 10-325 MG TABS (HYDROCODONE-ACETAMINOPHEN) 1 q4h  as needed for pain  #100 x 5  Entered and Authorized by:   Judithann Sheen MD   Signed by:   Judithann Sheen MD on 04/11/2010   Method used:   Print then Give to Patient   RxID:   [id number] PROCHLORPERAZINE MALEATE 10 MG TABS (PROCHLORPERAZINE MALEATE) 1 by mouth every 6 hrs as needed nausea  #100 x 5   Entered and Authorized by:   Judithann Sheen MD   Signed by:   Judithann Sheen MD on 04/11/2010   Method used:   Print then Give to Patient   RxID:   [id number] CARDURA 2 MG  TABS (DOXAZOSIN MESYLATE) 1 by mouth once daily.  #90 x 3   Entered and Authorized by:   Judithann Sheen MD   Signed by:   Judithann Sheen MD on 04/11/2010   Method used:   Print then Give to Patient   RxID:   [id number] OMEPRAZOLE 20 MG CPDR (OMEPRAZOLE) Take 1 capsule by mouth once a day  #90 x 3    Entered and Authorized by:   Judithann Sheen MD   Signed by:   Judithann Sheen MD on 04/11/2010   Method used:   Printed then faxed to ...       [facility]* (mail-order)       [address] [place], [place]  [address]       Ph: [phone]       Fax: [phone]   RxID:   [id number]   Laboratory Results   Urine Tests  Date/Time Recieved: April 11, 2010 11:41 AM  Date/Time Reported: April 11, 2010 11:41 AM   Routine Urinalysis   Color: yellow Appearance: Clear Glucose: negative   (Normal Range: Negative) Bilirubin: negative   (Normal Range: Negative) Ketone: negative   (Normal Range: Negative) Spec. Gravity: 1.020   (Normal Range: 1.003-1.035) Blood: 1+   (Normal Range: Negative) pH: 6.0   (Normal Range: 5.0-8.0) Protein: negative   (Normal Range: Negative) Urobilinogen: 0.2   (Normal Range: 0-1) Nitrite: negative   (Normal Range: Negative) Leukocyte Esterace: trace   (Normal Range: Negative)    Comments: Wynona Canes, CMA  April 11, 2010 11:41 AM

## 2010-11-05 NOTE — Assessment & Plan Note (Signed)
Summary: SHOULDER PAIN/[place]/RSC PT PER [facility]   Vital Signs:  Patient profile:   65 year old male Height:      71 inches (180.34 cm) Weight:      194 pounds (88.18 kg) O2 Sat:      98 % on Room air Temp:     98.2 degrees F (36.78 degrees C) oral Pulse rate:   91 / minute BP sitting:   130 / 90  (left arm) Cuff size:   regular  Vitals Entered By: Josph Macho RMA (July 23, 2010 12:03 PM)  O2 Flow:  Room air CC: Left shoulder pain/ CF Is Patient Diabetic? No   History of Present Illness: Patient in today 65 year old Caucasian nonsmoking male in today fhe reports difficulty with left posterior shoulder pain intermittently for many years. Has received steroid injections in the trigger point location off and on over the years with good results. Also takes low-dose prednisone on a daily basis. denies any trauma or recent falls. He denies any radicular symptoms. Will sometimes get enough spasm at the spasm radiates up into his neck anteriorly below his clavicle as well but he's not that bad so far. No headache, chest pain, palpitations, shortness of breath, fevers, chills, URI symptoms, or GI concerns. Does note some urinary symptoms worsened in the recent past. He reports he is treated urinary tract infection with dysuria twice with ciprofloxacin. The dysuria is gone but unfortunately continues to have some issues with urinary hesitancy and nocturia. He notes from roughly 3-70 and he thought everyone to 2 hours to urinate and has a very decreased flow at that time. Presently he is only taking to Zosyn. In the past he's taken Flomax in while it helped his urinary hesitancy he did cause some side effects such as a change in his voice and he chose to stop it. He denies hematuria, dysuria, urgency or incontinence at this time. Denies abdominal pain or constipation as well.  Current Medications (verified): 1)  Omeprazole 20 Mg Cpdr (Omeprazole) .... Take 1 Capsule By Mouth Once A Day 2)  Cardura  2 Mg  Tabs (Doxazosin Mesylate) .Marland Kitchen.. 1 By Mouth Once Daily. 3)  Triamcinolone Acetonide 0.5 %  Crea (Triamcinolone Acetonide) .... Apply Two Times A Day 4)  Testim 1 % Gel (Testosterone) .... Apply I Tube Daily 5)  Prochlorperazine Maleate 10 Mg Tabs (Prochlorperazine Maleate) .Marland Kitchen.. 1 By Mouth Every 6 Hrs As Needed Nausea 6)  Fluticasone Propionate 50 Mcg/act Susp (Fluticasone Propionate) .... 2 Sprays [place] Nostril Once Daily 7)  Triazolam 0.25 Mg Tabs (Triazolam) .Marland Kitchen.. 1 Hs For Sleep 8)  Lorazepam 0.5 Mg Tabs (Lorazepam) .Marland Kitchen.. 1 Three Times A Day For Stress 9)  Vitamin D (Ergocalciferol) 50000 Unit Caps (Ergocalciferol) .Marland Kitchen.. 1 Weekly X 12 Weeks 10)  Maxzide 75-50 Mg Tabs (Triamterene-Hctz) .... Take 1 Stat Then 1 By Mouth Each Day 11)  Sedapap 50-650 Mg Tabs (Butalbital-Acetaminophen) .Marland Kitchen.. 1-2 Q4h As Needed Headache, Does Not Have Caffeine 12)  Xyzal 5 Mg Tabs (Levocetirizine Dihydrochloride) .Marland Kitchen.. 1 Qd 13)  Proair Hfa 108 (90 Base) Mcg/act Aers (Albuterol Sulfate) .... 2 Inhalations Three Times A Day As Needed Wheezing 14)  Oxycodone-Acetaminophen 10-650 Mg Tabs (Oxycodone-Acetaminophen) .Marland Kitchen.. 1 Q4h As Needed Paoin Not Over 4 Per Day, Take 2 Senna At Night 15)  Senokot 8.6 Mg Tabs (Sennosides) .... 2-4 H.s. P.r.n. For Constipation 16)  Hydrocodone-Acetaminophen 10-325 Mg Tabs (Hydrocodone-Acetaminophen) .Marland Kitchen.. 1 Q4h  As Needed For Pain 17)  Tobradex 0.3-0.1 % Susp (Tobramycin-Dexamethasone) .Marland KitchenMarland KitchenMarland Kitchen  1 Drop Each Eye Tid 18)  Hyoscyamine Sulfate Cr 0.375 Mg Xr12h-Tab (Hyoscyamine Sulfate) .Marland Kitchen.. 1 Two Times A Day For Irritable Bowel 19)  Celebrex 200 Mg Caps (Celecoxib) .Marland Kitchen.. 1 Two Times A Day For Arthritis 20)  Lunesta 3 Mg Tabs (Eszopiclone) .Marland Kitchen.. 1 Hs For Sleep 21)  Ciprofloxacin Hcl 500 Mg Tabs (Ciprofloxacin Hcl) .... One By Mouth Two Times A Day For 14 Days  Allergies (verified): 1)  ! Sudafed 2)  ! Codeine 3)  ! Zocor 4)  ! Demerol 5)  ! Caffeine  Past History:  Past medical history reviewed  for relevance to current acute and chronic problems. Social history (including risk factors) reviewed for relevance to current acute and chronic problems.  Past Medical History: Reviewed history from 09/27/2008 and no changes required. arthritis Asthma GERD Hyperlipidemia Urinary incontinence BPH  ekg   2009 eye exam   2009 colonscopy   2005  repeat  2015   DT     2009 PNEUNOVAX SHINGLES VACCINE SMOKER  Never       Specialist: Dr Tammy Sours Dr Retta Diones  Social History: Reviewed history and no changes required.  Review of Systems      See HPI       Flu Vaccine Consent Questions     Do you have a history of severe allergic reactions to this vaccine? no    Any prior history of allergic reactions to egg and/or gelatin? no    Do you have a sensitivity to the preservative Thimersol? no    Do you have a past history of Guillan-Barre Syndrome? no    Do you currently have an acute febrile illness? no    Have you ever had a severe reaction to latex? no    Vaccine information given and explained to patient? yes    Are you currently pregnant? no    Lot Number:[id number]   Exp Date:04/05/2011   Site Given  Left Deltoid IM Josph Macho RMA  July 23, 2010 12:08 PM   Physical Exam  General:  Well-developed,well-nourished,in no acute distress; alert,appropriate and cooperative throughout examination Head:  Normocephalic and atraumatic without obvious abnormalities. No apparent alopecia or balding. Mouth:  Oral mucosa and oropharynx without lesions or exudates.  Teeth in good repair. Neck:  No deformities, masses, or tenderness noted. Lungs:  Normal respiratory effort, chest expands symmetrically. Lungs are clear to auscultation, no crackles or wheezes. Heart:  Normal rate and regular rhythm. S1 and S2 normal without gallop, murmur, click, rub or other extra sounds. Abdomen:  Bowel sounds positive,abdomen soft and non-tender without masses, organomegaly or hernias  noted. Msk:  muscle spasm noted over left shoulder posteriorly likely in the Teres Major muscle, has full range of motion in shoulder,spasm roughly 3cm in diameter and circumscribed. Extremities:  No clubbing, cyanosis, edema, or deformity noted with normal full range of motion of all joints.   Psych:  Cognition and judgment appear intact. Alert and cooperative with normal attention span and concentration. No apparent delusions, illusions, hallucinations   Impression & Recommendations:  Problem # 1:  SHOULDER PAIN, LEFT (ICD-719.41)  His updated medication list for this problem includes:    Sedapap 50-650 Mg Tabs (Butalbital-acetaminophen) .Marland Kitchen... 1-2 q4h as needed headache, does not have caffeine    Oxycodone-acetaminophen 10-650 Mg Tabs (Oxycodone-acetaminophen) .Marland Kitchen... 1 q4h as needed paoin not over 4 per day, take 2 senna at night    Hydrocodone-acetaminophen 10-325 Mg Tabs (Hydrocodone-acetaminophen) .Marland Kitchen... 1 q4h  as needed for pain  Celebrex 200 Mg Caps (Celecoxib) .Marland Kitchen... 1 two times a day for arthritis    Cyclobenzaprine Hcl 10 Mg Tabs (Cyclobenzaprine hcl) .Marland Kitchen... 1/2 to 1 tab by mouth at bedtime as needed pain Is choosing not to take the Celebrex he does not feel it gives him relief. Is given samples of Flector patch to apply q 12hours as needed for pain, if this is helpful will prescribe.  Problem # 2:  NOCTURIA (JYN-829.56)  Orders: UA Dipstick w/o Micro (automated)  (81003) Urinary hesitancy and a history of response to Flomax, likely BPH, will give samples of Rapaflo as suggested by his Urologist to start. If helpful will rx.  Problem # 3:  CONSTIPATION (ICD-564.00)  His updated medication list for this problem includes:    Senokot 8.6 Mg Tabs (Sennosides) .Marland Kitchen... 2-4 h.s. p.r.n. for constipation No c/o today  Medications Added to Medication List This Visit: 1)  Cyclobenzaprine Hcl 10 Mg Tabs (Cyclobenzaprine hcl) .... 1/2 to 1 tab by mouth at bedtime as needed pain 2)  Flector  1.3 % Ptch (Diclofenac epolamine) .... Apply patch to affected area q 12 hours 3)  Rapaflo 8 Mg Caps (Silodosin) .Marland Kitchen.. 1 tab by mouth at bedtime  Complete Medication List: 1)  Omeprazole 20 Mg Cpdr (Omeprazole) .... Take 1 capsule by mouth once a day 2)  Cardura 2 Mg Tabs (Doxazosin mesylate) .Marland Kitchen.. 1 by mouth once daily. 3)  Triamcinolone Acetonide 0.5 % Crea (Triamcinolone acetonide) .... Apply two times a day 4)  Testim 1 % Gel (Testosterone) .... Apply i tube daily 5)  Prochlorperazine Maleate 10 Mg Tabs (Prochlorperazine maleate) .Marland Kitchen.. 1 by mouth every 6 hrs as needed nausea 6)  Fluticasone Propionate 50 Mcg/act Susp (Fluticasone propionate) .... 2 sprays eaach nostril once daily 7)  Triazolam 0.25 Mg Tabs (Triazolam) .Marland Kitchen.. 1 hs for sleep 8)  Lorazepam 0.5 Mg Tabs (Lorazepam) .Marland Kitchen.. 1 three times a day for stress 9)  Vitamin D (ergocalciferol) 50000 Unit Caps (Ergocalciferol) .Marland KitchenMarland KitchenMarland Kitchen 1 weekly x 12 weeks 10)  Maxzide 75-50 Mg Tabs (Triamterene-hctz) .... Take 1 stat then 1 by mouth each day 11)  Sedapap 50-650 Mg Tabs (Butalbital-acetaminophen) .Marland Kitchen.. 1-2 q4h as needed headache, does not have caffeine 12)  Xyzal 5 Mg Tabs (Levocetirizine dihydrochloride) .Marland Kitchen.. 1 qd 13)  Proair Hfa 108 (90 Base) Mcg/act Aers (Albuterol sulfate) .... 2 inhalations three times a day as needed wheezing 14)  Oxycodone-acetaminophen 10-650 Mg Tabs (Oxycodone-acetaminophen) .Marland Kitchen.. 1 q4h as needed paoin not over 4 per day, take 2 senna at night 15)  Senokot 8.6 Mg Tabs (Sennosides) .... 2-4 h.s. p.r.n. for constipation 16)  Hydrocodone-acetaminophen 10-325 Mg Tabs (Hydrocodone-acetaminophen) .Marland Kitchen.. 1 q4h  as needed for pain 17)  Tobradex 0.3-0.1 % Susp (Tobramycin-dexamethasone) .Marland Kitchen.. 1 drop each eye tid 18)  Hyoscyamine Sulfate Cr 0.375 Mg Xr12h-tab (Hyoscyamine sulfate) .Marland Kitchen.. 1 two times a day for irritable bowel 19)  Celebrex 200 Mg Caps (Celecoxib) .Marland Kitchen.. 1 two times a day for arthritis 20)  Lunesta 3 Mg Tabs (Eszopiclone) .Marland Kitchen.. 1  hs for sleep 21)  Ciprofloxacin Hcl 500 Mg Tabs (Ciprofloxacin hcl) .... One by mouth two times a day for 14 days 22)  Cyclobenzaprine Hcl 10 Mg Tabs (Cyclobenzaprine hcl) .... 1/2 to 1 tab by mouth at bedtime as needed pain 23)  Flector 1.3 % Ptch (Diclofenac epolamine) .... Apply patch to affected area q 12 hours 24)  Rapaflo 8 Mg Caps (Silodosin) .Marland Kitchen.. 1 tab by mouth at bedtime  Other Orders: Flu Vaccine 30yrs +  MEDICARE PATIENTS [phone]) Administration Flu vaccine - MCR (U0454)  Patient Instructions: 1)  Most patients (90%) with low back pain will improve with time ( 2-6 weeks). Keep active but avoid activities that are painful. Apply moist heat and/or ice to lower back several times a day.  2)  Please schedule a follow-up appointment as needed if symptoms worsen or do not improve 3)  Try the Cyclobenzaprine, a muscle relaxer at night, apply moist heat and then stretch daily in the shoulder region. Place the Flector patch over the site of pain and change every 12 hours as needed. Prescriptions: RAPAFLO 8 MG CAPS (SILODOSIN) 1 tab by mouth at bedtime  #42 x 0   Entered and Authorized by:   Danise Edge MD   Signed by:   Danise Edge MD on 07/23/2010   Method used:   Samples Given   RxID:   [id number] FLECTOR 1.3 % PTCH (DICLOFENAC EPOLAMINE) apply patch to affected area q 12 hours  #8 x 0   Entered and Authorized by:   Danise Edge MD   Signed by:   Danise Edge MD on 07/23/2010   Method used:   Samples Given   RxID:   [id number] CYCLOBENZAPRINE HCL 10 MG TABS (CYCLOBENZAPRINE HCL) 1/2 to 1 tab by mouth at bedtime as needed pain  #30 x 1   Entered and Authorized by:   Danise Edge MD   Signed by:   Danise Edge MD on 07/23/2010   Method used:   Electronically to        [facility]. [phone]* (retail)       [address]       [place], [place]  [address]       Ph: [phone] or [phone]       Fax: [phone]   RxID:    [id number]    Orders Added: 1)  Flu Vaccine 81yrs + MEDICARE PATIENTS [Q2039] 2)  Administration Flu vaccine - MCR [G0008] 3)  UA Dipstick w/o Micro (automated)  [81003] 4)  Est. Patient Level IV [93235]  Appended Document: SHOULDER PAIN/NJ/RSC PT PER [facility]  Laboratory Results   Urine Tests    Routine Urinalysis   Color: yellow Appearance: Hazy Glucose: negative   (Normal Range: Negative) Bilirubin: negative   (Normal Range: Negative) Ketone: negative   (Normal Range: Negative) Spec. Gravity: 1.025   (Normal Range: 1.003-1.035) Blood: 1+   (Normal Range: Negative) pH: 5.0   (Normal Range: 5.0-8.0) Protein: negative   (Normal Range: Negative) Urobilinogen: 0.2   (Normal Range: 0-1) Nitrite: negative   (Normal Range: Negative) Leukocyte Esterace: negative   (Normal Range: Negative)    Comments: Rita Ohara  July 23, 2010 3:14 PM     notify hematuria still present all else looks OK should do another UA in a month  I left a message for pt to return my call/ CF  Patient informed/ CF

## 2010-11-05 NOTE — Letter (Signed)
Summary: [facility]  [facility]   Imported By: Maryln Gottron 06/05/2010 13:33:18  _____________________________________________________________________  External Attachment:    Type:   Image     Comment:   External Document

## 2010-11-05 NOTE — Medication Information (Signed)
Summary: Prior Authorization Request for Testim Gel  Prior Authorization Request for Testim Gel   Imported By: Maryln Gottron 01/07/2010 10:38:12  _____________________________________________________________________  External Attachment:    Type:   Image     Comment:   External Document

## 2010-11-05 NOTE — Assessment & Plan Note (Signed)
Summary: MED CK (REFILLS) // RS   Vital Signs:  Patient profile:   65 year old male Weight:      191 pounds BMI:     26.74 O2 Sat:      98 % Temp:     98 degrees F Pulse rate:   80 / minute Pulse rhythm:   regular BP sitting:   112 / 66  (left arm) Cuff size:   regular  Vitals Entered By: Pura Spice, RN (November 27, 2009 3:57 PM) CC: refill meds .pain in shoulder wants shot left shoulder    History of Present Illness: this 65 year old white male with known hypertension that is well-controlled he is in today for refill of medications also has pain in the left shoulder point tenderness in the left axis was within present before and relieved with Depo-Medrol injection and lidocaine Patient continues to have insomnia and needs his medications refilled Chronic low back pain, needs to continue anti-inflammatory medication as well as analgesics Cranial problems on the control at this time Continues to have some family stress and anxiety, as seriously ill wife with carcinoma  Allergies: 1)  ! Sudafed 2)  ! Codeine 3)  ! Zocor 4)  ! Demerol 5)  ! Caffeine  Past History:  Past Medical History: Last updated: 09/27/2008 arthritis Asthma GERD Hyperlipidemia Urinary incontinence BPH  ekg   2009 eye exam   2009 colonscopy   2005  repeat  2015   DT     2009 PNEUNOVAX SHINGLES VACCINE SMOKER  Never       Specialist: Dr Tammy Sours Dr Retta Diones  Past Surgical History: Last updated: 12/15/2007 Appendectomy Cholecystectomy Lumbar laminectomy Tonsillectomy  Review of Systems  The patient denies anorexia, fever, weight loss, weight gain, vision loss, decreased hearing, hoarseness, chest pain, syncope, dyspnea on exertion, peripheral edema, prolonged cough, headaches, hemoptysis, abdominal pain, melena, hematochezia, severe indigestion/heartburn, hematuria, incontinence, genital sores, muscle weakness, suspicious skin lesions, transient blindness, difficulty walking,  depression, unusual weight change, abnormal bleeding, enlarged lymph nodes, angioedema, breast masses, and testicular masses.    Physical Exam  General:  Well-developed,well-nourished,in no acute distress; alert,appropriate and cooperative throughout examination Head:  Normocephalic and atraumatic without obvious abnormalities. No apparent alopecia or balding. Eyes:  No corneal or conjunctival inflammation noted. EOMI. Perrla. Funduscopic exam benign, without hemorrhages, exudates or papilledema. Vision grossly normal. Ears:  External ear exam shows no significant lesions or deformities.  Otoscopic examination reveals clear canals, tympanic membranes are intact bilaterally without bulging, retraction, inflammation or discharge. Hearing is grossly normal bilaterally. Nose:  External nasal examination shows no deformity or inflammation. Nasal mucosa are pink and moist without lesions or exudates. Mouth:  Oral mucosa and oropharynx without lesions or exudates.  Teeth in good repair. Neck:  No deformities, masses, or tenderness noted. Lungs:  Normal respiratory effort, chest expands symmetrically. Lungs are clear to auscultation, no crackles or wheezes. Heart:  Normal rate and regular rhythm. S1 and S2 normal without gallop, murmur, click, rub or other extra sounds. Abdomen:  Bowel sounds positive,abdomen soft and non-tender without masses, organomegaly or hernias noted. Msk:  point tenderness area of inflammation in the left latissimus umbo very tender to touch painful long activity Pulses:  R and L carotid,radial,femoral,dorsalis pedis and posterior tibial pulses are full and equal bilaterally Extremities:  No clubbing, cyanosis, edema, or deformity noted with normal full range of motion of all joints.   Neurologic:  No cranial nerve deficits noted. Station and gait are normal. Plantar  reflexes are down-going bilaterally. DTRs are symmetrical throughout. Sensory, motor and coordinative functions  appear intact.   Impression & Recommendations:  Problem # 1:  OSTEOPENIA (ICD-733.90) Assessment New  His updated medication list for this problem includes:    Vitamin D (ergocalciferol) 50000 Unit Caps (Ergocalciferol) .Marland Kitchen... 1 weekly x 12 weeks  Orders: T-Vitamin D (25-Hydroxy) ([id number])  Problem # 2:  OSTEOARTHRITIS, ANKLES, BILATERAL (ICD-715.97) Assessment: Unchanged  The following medications were removed from the medication list:    Hydrocodone-acetaminophen 10-325 Mg Tabs (Hydrocodone-acetaminophen) .Marland Kitchen... 1 every 4-6 hrs as needed pain no early refill    Tramadol Hcl 50 Mg Tabs (Tramadol hcl) .Marland Kitchen... 2 tabs q3-4 h as needed pain His updated medication list for this problem includes:    Celebrex 200 Mg Caps (Celecoxib) .Marland Kitchen... 1 by mouth once daily    Sedapap 50-650 Mg Tabs (Butalbital-acetaminophen) .Marland Kitchen... 1-2 q4h as needed headache, does not have caffeine    Hydrocodone-acetaminophen 10-500 Mg Tabs (Hydrocodone-acetaminophen) .Marland Kitchen... 1 q4h as needed pain not over 4 per day  Problem # 3:  HYPERTENSION, BENIGN ESSENTIAL (ICD-401.1) Assessment: Improved  His updated medication list for this problem includes:    Cardura 2 Mg Tabs (Doxazosin mesylate) .Marland Kitchen... 1 by mouth once daily.    Maxzide 75-50 Mg Tabs (Triamterene-hctz) .Marland Kitchen... Take 1 stat then 1 by mouth each day  Orders: TLB-BMP (Basic Metabolic Panel-BMET) (80048-METABOL) Prescription Created Electronically [phone])  Problem # 4:  ANXIETY (ICD-300.00) Assessment: Improved  His updated medication list for this problem includes:    Lorazepam 0.5 Mg Tabs (Lorazepam) .Marland Kitchen... 1 three times a day for stress    Alprazolam 0.25 Mg Tabs (Alprazolam) .Marland Kitchen... 1 three times a day as needed stress  Problem # 5:  MYOSITIS (ICD-729.1) Assessment: Deteriorated  The following medications were removed from the medication list:    Hydrocodone-acetaminophen 10-325 Mg Tabs (Hydrocodone-acetaminophen) .Marland Kitchen... 1 every 4-6 hrs as needed pain no  early refill    Tramadol Hcl 50 Mg Tabs (Tramadol hcl) .Marland Kitchen... 2 tabs q3-4 h as needed pain His updated medication list for this problem includes:    Celebrex 200 Mg Caps (Celecoxib) .Marland Kitchen... 1 by mouth once daily    Sedapap 50-650 Mg Tabs (Butalbital-acetaminophen) .Marland Kitchen... 1-2 q4h as needed headache, does not have caffeine    Hydrocodone-acetaminophen 10-500 Mg Tabs (Hydrocodone-acetaminophen) .Marland Kitchen... 1 q4h as needed pain not over 4 per day  Problem # 6:  BACK PAIN, CHRONIC (ICD-724.5) Assessment: Unchanged  The following medications were removed from the medication list:    Hydrocodone-acetaminophen 10-325 Mg Tabs (Hydrocodone-acetaminophen) .Marland Kitchen... 1 every 4-6 hrs as needed pain no early refill    Tramadol Hcl 50 Mg Tabs (Tramadol hcl) .Marland Kitchen... 2 tabs q3-4 h as needed pain His updated medication list for this problem includes:    Celebrex 200 Mg Caps (Celecoxib) .Marland Kitchen... 1 by mouth once daily    Sedapap 50-650 Mg Tabs (Butalbital-acetaminophen) .Marland Kitchen... 1-2 q4h as needed headache, does not have caffeine    Hydrocodone-acetaminophen 10-500 Mg Tabs (Hydrocodone-acetaminophen) .Marland Kitchen... 1 q4h as needed pain not over 4 per day  Problem # 7:  HYPOGONADISM, MALE (ICD-257.2) Assessment: Unchanged 2 recheck testosterone level  Problem # 8:  URETERAL CALCULUS, HX OF (ICD-V13.01) Assessment: Unchanged  Problem # 9:  GERD (ICD-530.81) Assessment: Improved  His updated medication list for this problem includes:    Omeprazole 20 Mg Cpdr (Omeprazole) .Marland Kitchen... Take 1 capsule by mouth once a day  Complete Medication List: 1)  Omeprazole 20 Mg Cpdr (Omeprazole) .... Take  1 capsule by mouth once a day 2)  Oxybutynin Chloride 5 Mg Tb24 (Oxybutynin chloride) .... Take 1 tablet by mouth once a day 3)  Cardura 2 Mg Tabs (Doxazosin mesylate) .Marland Kitchen.. 1 by mouth once daily. 4)  Triamcinolone Acetonide 0.5 % Crea (Triamcinolone acetonide) .... Apply two times a day 5)  Fexofenadine Hcl 60 Mg Tabs (Fexofenadine hcl) .Marland Kitchen.. 1 by mouth  once daily. 6)  Testim 1 % Gel (Testosterone) .... Apply i tube daily 7)  Prochlorperazine Maleate 10 Mg Tabs (Prochlorperazine maleate) .Marland Kitchen.. 1 by mouth every 6 hrs as needed nausea 8)  Celebrex 200 Mg Caps (Celecoxib) .Marland Kitchen.. 1 by mouth once daily 9)  Fluticasone Propionate 50 Mcg/act Susp (Fluticasone propionate) .... 2 sprays eaach nostril once daily 10)  Triazolam 0.25 Mg Tabs (Triazolam) .Marland Kitchen.. 1 hs for sleep 11)  Prednisone 10 Mg Tabs (Prednisone) .Marland Kitchen.. 1 tidpc x 4 days, two times a day for 6 days then 1 each day 12)  Lorazepam 0.5 Mg Tabs (Lorazepam) .Marland Kitchen.. 1 three times a day for stress 13)  Vitamin D (ergocalciferol) 50000 Unit Caps (Ergocalciferol) .Marland KitchenMarland KitchenMarland Kitchen 1 weekly x 12 weeks 14)  Maxzide 75-50 Mg Tabs (Triamterene-hctz) .... Take 1 stat then 1 by mouth each day 15)  Sedapap 50-650 Mg Tabs (Butalbital-acetaminophen) .Marland Kitchen.. 1-2 q4h as needed headache, does not have caffeine 16)  Xyzal 5 Mg Tabs (Levocetirizine dihydrochloride) .Marland Kitchen.. 1 qd 17)  Hydrocodone-acetaminophen 10-500 Mg Tabs (Hydrocodone-acetaminophen) .Marland Kitchen.. 1 q4h as needed pain not over 4 per day 18)  Alprazolam 0.25 Mg Tabs (Alprazolam) .Marland Kitchen.. 1 three times a day as needed stress 19)  Proair Hfa 108 (90 Base) Mcg/act Aers (Albuterol sulfate) .... 2 inhalations three times a day as needed wheezing  Other Orders: TLB-CBC Platelet - w/Differential (85025-CBCD) TLB-Hepatic/Liver Function Pnl (80076-HEPATIC) TLB-Lipid Panel (80061-LIPID)  Patient Instructions: 1)  Injected trigger zone with Depo-Medrol 120 mg +1% lidocaine 2)  Refill medications 3)  Please schedule laboratory studies and physical examination in near future Prescriptions: PROAIR HFA 108 (90 BASE) MCG/ACT AERS (ALBUTEROL SULFATE) 2 inhalations three times a day as needed wheezing  #1 x 11   Entered and Authorized by:   Judithann Sheen MD   Signed by:   Judithann Sheen MD on 11/27/2009   Method used:   Print then Give to Patient   RxID:    [id number] ALPRAZOLAM 0.25 MG TABS (ALPRAZOLAM) 1 three times a day as needed stress  #90 x 5   Entered and Authorized by:   Judithann Sheen MD   Signed by:   Judithann Sheen MD on 11/27/2009   Method used:   Print then Give to Patient   RxID:   [id number] TRIAZOLAM 0.25 MG TABS (TRIAZOLAM) 1 hs for sleep  #30 x 5   Entered and Authorized by:   Judithann Sheen MD   Signed by:   Judithann Sheen MD on 11/27/2009   Method used:   Print then Give to Patient   RxID:   [id number] TESTIM 1 % GEL (TESTOSTERONE) Apply i tube daily  #30 x 5   Entered and Authorized by:   Judithann Sheen MD   Signed by:   Judithann Sheen MD on 11/27/2009   Method used:   Print then Give to Patient   RxID:   [id number] HYDROCODONE-ACETAMINOPHEN 10-500 MG TABS (HYDROCODONE-ACETAMINOPHEN) 1 q4h as needed pain not over 4 per day  #120 x 5  Entered and Authorized by:   Judithann Sheen MD   Signed by:   Judithann Sheen MD on 11/27/2009   Method used:   Print then Give to Patient   RxID:   [id number] XYZAL 5 MG TABS (LEVOCETIRIZINE DIHYDROCHLORIDE) 1 qd  #30 x 11   Entered and Authorized by:   Judithann Sheen MD   Signed by:   Judithann Sheen MD on 11/27/2009   Method used:   Electronically to        [facility] [phone]* (retail)       [address]       [place], [place]  [address]       Ph: [phone] or [phone]       Fax: [phone]   RxID:   [id number] TRIAMCINOLONE ACETONIDE 0.5 %  CREA (TRIAMCINOLONE ACETONIDE) apply two times a day  #60 gms x 11   Entered and Authorized by:   Judithann Sheen MD   Signed by:   Judithann Sheen MD on 11/27/2009   Method used:   Electronically to        [facility] [phone]* (retail)       [address]       [place], [place]  [address]       Ph: [phone] or [phone]       Fax: [phone]    RxID:   [id number]

## 2010-11-05 NOTE — Assessment & Plan Note (Signed)
Summary: PAIN IN SHOULDER/CONSTIPATION/CJR   Vital Signs:  Patient profile:   65 year old male Weight:      195 pounds Temp:     98.1 degrees F Pulse rate:   106 / minute Pulse rhythm:   regular BP sitting:   120 / 74  (left arm) Cuff size:   large  Vitals Entered By: Pura Spice, RN (April 02, 2010 4:09 PM) CC: reck shoulder neeeds refills had labs in March wants refills today  last cpx 09-27-08    History of Present Illness: this 65 year old white married male lives in today complaining of pain over the left trapezius area which has a chronic history of a trigger area which causes spasm and pain and needs injections periodically. Blood pressure than difficult to control in the past but is now under good control Patient needs to come in for complete dilation plus lab studies in the near future past problem with constipation and to discuss treatment Molly Maduro relates she is havin problem with GERD at this time g   Allergies: 1)  ! Sudafed 2)  ! Codeine 3)  ! Zocor 4)  ! Demerol 5)  ! Caffeine  Past History:  Past Medical History: Last updated: 09/27/2008 arthritis Asthma GERD Hyperlipidemia Urinary incontinence BPH  ekg   2009 eye exam   2009 colonscopy   2005  repeat  2015   DT     2009 PNEUNOVAX SHINGLES VACCINE SMOKER  Never       Specialist: Dr Tammy Sours Dr Retta Diones  Past Surgical History: Last updated: 12/15/2007 Appendectomy Cholecystectomy Lumbar laminectomy Tonsillectomy  Review of Systems  The patient denies anorexia, fever, weight loss, weight gain, vision loss, decreased hearing, hoarseness, chest pain, syncope, dyspnea on exertion, peripheral edema, prolonged cough, headaches, hemoptysis, abdominal pain, melena, hematochezia, severe indigestion/heartburn, hematuria, incontinence, genital sores, muscle weakness, suspicious skin lesions, transient blindness, difficulty walking, depression, unusual weight change, abnormal bleeding, enlarged  lymph nodes, angioedema, breast masses, and testicular masses.    Physical Exam  General:  Well-developed,well-nourished,in no acute distress; alert,appropriate and cooperative throughout examination Lungs:  Normal respiratory effort, chest expands symmetrically. Lungs are clear to auscultation, no crackles or wheezes. Heart:  Normal rate and regular rhythm. S1 and S2 normal without gallop, murmur, click, rub or other extra sounds. Abdomen:  Bowel sounds positive,abdomen soft and non-tender without masses, organomegaly or hernias noted. Rectal:  No external abnormalities noted. Normal sphincter tone. No rectal masses or tenderness. no impaction Prostate:  no nodules, no asymmetry, and 1+ enlarged.   Msk:  some muscle spasm left trapezius but one area of marked tenderness   Impression & Recommendations:  Problem # 1:  MYOSITIS (ICD-729.1) Assessment New  His updated medication list for this problem includes:    Celebrex 200 Mg Caps (Celecoxib) .Marland Kitchen... 1 by mouth once daily    Sedapap 50-650 Mg Tabs (Butalbital-acetaminophen) .Marland Kitchen... 1-2 q4h as needed headache, does not have caffeine    Hydrocodone-acetaminophen 10-500 Mg Tabs (Hydrocodone-acetaminophen) .Marland Kitchen... 1 q4h as needed pain not over 4 per day    Oxycodone-acetaminophen 10-650 Mg Tabs (Oxycodone-acetaminophen) .Marland Kitchen... 1 q4h as needed paoin not over 4 per day, take 2 senna at night injected trigger zone one Depo-Medrol 120 mg +2 cc of lidocaine  Problem # 2:  ARTHRITIS, CERVICAL SPINE (ICD-721.90) Assessment: Unchanged continue Celebrex 2 mg b.i.d.  Problem # 3:  HYPERTENSION, BENIGN ESSENTIAL (ICD-401.1) Assessment: Improved  His updated medication list for this problem includes:    Cardura 2 Mg  Tabs (Doxazosin mesylate) .Marland Kitchen... 1 by mouth once daily.    Maxzide 75-50 Mg Tabs (Triamterene-hctz) .Marland Kitchen... Take 1 stat then 1 by mouth each day    Amlodipine Besylate 5 Mg Tabs (Amlodipine besylate) .Marland Kitchen... 1 by mouth once daily  Problem # 4:   ARTHRITIS (ICD-716.90) Assessment: Unchanged  Problem # 5:  BACK PAIN, CHRONIC (ICD-724.5) Assessment: Unchanged  His updated medication list for this problem includes:    Celebrex 200 Mg Caps (Celecoxib) .Marland Kitchen... 1 by mouth once daily    Sedapap 50-650 Mg Tabs (Butalbital-acetaminophen) .Marland Kitchen... 1-2 q4h as needed headache, does not have caffeine    Hydrocodone-acetaminophen 10-500 Mg Tabs (Hydrocodone-acetaminophen) .Marland Kitchen... 1 q4h as needed pain not over 4 per day    Oxycodone-acetaminophen 10-650 Mg Tabs (Oxycodone-acetaminophen) .Marland Kitchen... 1 q4h as needed paoin not over 4 per day, take 2 senna at night  Problem # 6:  CONSTIPATION (ICD-564.00) Assessment: New  His updated medication list for this problem includes:    Senokot 8.6 Mg Tabs (Sennosides) .Marland Kitchen... 2-4 h.s. p.r.n. for constipation  Problem # 7:  GERD (ICD-530.81) Assessment: Improved  His updated medication list for this problem includes:    Omeprazole 20 Mg Cpdr (Omeprazole) .Marland Kitchen... Take 1 capsule by mouth once a day  Complete Medication List: 1)  Omeprazole 20 Mg Cpdr (Omeprazole) .... Take 1 capsule by mouth once a day 2)  Oxybutynin Chloride 5 Mg Tb24 (Oxybutynin chloride) .... Take 1 tablet by mouth once a day 3)  Cardura 2 Mg Tabs (Doxazosin mesylate) .Marland Kitchen.. 1 by mouth once daily. 4)  Triamcinolone Acetonide 0.5 % Crea (Triamcinolone acetonide) .... Apply two times a day 5)  Fexofenadine Hcl 60 Mg Tabs (Fexofenadine hcl) .Marland Kitchen.. 1 by mouth once daily. 6)  Testim 1 % Gel (Testosterone) .... Apply i tube daily 7)  Prochlorperazine Maleate 10 Mg Tabs (Prochlorperazine maleate) .Marland Kitchen.. 1 by mouth every 6 hrs as needed nausea 8)  Celebrex 200 Mg Caps (Celecoxib) .Marland Kitchen.. 1 by mouth once daily 9)  Fluticasone Propionate 50 Mcg/act Susp (Fluticasone propionate) .... 2 sprays eaach nostril once daily 10)  Triazolam 0.25 Mg Tabs (Triazolam) .Marland Kitchen.. 1 hs for sleep 11)  Lorazepam 0.5 Mg Tabs (Lorazepam) .Marland Kitchen.. 1 three times a day for stress 12)  Vitamin D  (ergocalciferol) 50000 Unit Caps (Ergocalciferol) .Marland KitchenMarland KitchenMarland Kitchen 1 weekly x 12 weeks 13)  Maxzide 75-50 Mg Tabs (Triamterene-hctz) .... Take 1 stat then 1 by mouth each day 14)  Sedapap 50-650 Mg Tabs (Butalbital-acetaminophen) .Marland Kitchen.. 1-2 q4h as needed headache, does not have caffeine 15)  Xyzal 5 Mg Tabs (Levocetirizine dihydrochloride) .Marland Kitchen.. 1 qd 16)  Hydrocodone-acetaminophen 10-500 Mg Tabs (Hydrocodone-acetaminophen) .Marland Kitchen.. 1 q4h as needed pain not over 4 per day 17)  Proair Hfa 108 (90 Base) Mcg/act Aers (Albuterol sulfate) .... 2 inhalations three times a day as needed wheezing 18)  Vitamin D (ergocalciferol) 50000 Unit Caps (Ergocalciferol) .Marland Kitchen.. 1 by mouth weekly 19)  Cephalexin 500 Mg Caps (Cephalexin) .Marland Kitchen.. 1 stat then 1 morning , midafternoon and hs 20)  Amlodipine Besylate 5 Mg Tabs (Amlodipine besylate) .Marland Kitchen.. 1 by mouth once daily 21)  Prednisone 10 Mg Tabs (Prednisone) .Marland Kitchen.. 1 by mouth once daily 22)  Oxycodone-acetaminophen 10-650 Mg Tabs (Oxycodone-acetaminophen) .Marland Kitchen.. 1 q4h as needed paoin not over 4 per day, take 2 senna at night 23)  Senokot 8.6 Mg Tabs (Sennosides) .... 2-4 h.s. p.r.n. for constipation  Patient Instructions: 1)  Schedule appt for review of problems and fasting labs, please schedule in AM in near furure  2)  Buy Senokot , take 4 the fist night then 2-4 at night for constipatipon 3)  refilled medications 4)  injected trigger zone with lidocaine and depomedrol Prescriptions: OXYCODONE-ACETAMINOPHEN 10-650 MG TABS (OXYCODONE-ACETAMINOPHEN) 1 q4h as needed paoin not over 4 per day, take 2 senna at night  #100 x 0   Entered and Authorized by:   Judithann Sheen MD   Signed by:   Judithann Sheen MD on 04/02/2010   Method used:   Print then Give to Patient   RxID:   [id number]   Appended Document: PAIN IN SHOULDER/CONSTIPATION/CJR     Allergies: 1)  ! Sudafed 2)  ! Codeine 3)  ! Zocor 4)  ! Demerol 5)  ! Caffeine   Complete Medication List: 1)   Omeprazole 20 Mg Cpdr (Omeprazole) .... Take 1 capsule by mouth once a day 2)  Oxybutynin Chloride 5 Mg Tb24 (Oxybutynin chloride) .... Take 1 tablet by mouth once a day 3)  Cardura 2 Mg Tabs (Doxazosin mesylate) .Marland Kitchen.. 1 by mouth once daily. 4)  Triamcinolone Acetonide 0.5 % Crea (Triamcinolone acetonide) .... Apply two times a day 5)  Fexofenadine Hcl 60 Mg Tabs (Fexofenadine hcl) .Marland Kitchen.. 1 by mouth once daily. 6)  Testim 1 % Gel (Testosterone) .... Apply i tube daily 7)  Prochlorperazine Maleate 10 Mg Tabs (Prochlorperazine maleate) .Marland Kitchen.. 1 by mouth every 6 hrs as needed nausea 8)  Celebrex 200 Mg Caps (Celecoxib) .Marland Kitchen.. 1 by mouth once daily 9)  Fluticasone Propionate 50 Mcg/act Susp (Fluticasone propionate) .... 2 sprays eaach nostril once daily 10)  Triazolam 0.25 Mg Tabs (Triazolam) .Marland Kitchen.. 1 hs for sleep 11)  Lorazepam 0.5 Mg Tabs (Lorazepam) .Marland Kitchen.. 1 three times a day for stress 12)  Vitamin D (ergocalciferol) 50000 Unit Caps (Ergocalciferol) .Marland KitchenMarland KitchenMarland Kitchen 1 weekly x 12 weeks 13)  Maxzide 75-50 Mg Tabs (Triamterene-hctz) .... Take 1 stat then 1 by mouth each day 14)  Sedapap 50-650 Mg Tabs (Butalbital-acetaminophen) .Marland Kitchen.. 1-2 q4h as needed headache, does not have caffeine 15)  Xyzal 5 Mg Tabs (Levocetirizine dihydrochloride) .Marland Kitchen.. 1 qd 16)  Hydrocodone-acetaminophen 10-500 Mg Tabs (Hydrocodone-acetaminophen) .Marland Kitchen.. 1 q4h as needed pain not over 4 per day 17)  Proair Hfa 108 (90 Base) Mcg/act Aers (Albuterol sulfate) .... 2 inhalations three times a day as needed wheezing 18)  Vitamin D (ergocalciferol) 50000 Unit Caps (Ergocalciferol) .Marland Kitchen.. 1 by mouth weekly 19)  Cephalexin 500 Mg Caps (Cephalexin) .Marland Kitchen.. 1 stat then 1 morning , midafternoon and hs 20)  Amlodipine Besylate 5 Mg Tabs (Amlodipine besylate) .Marland Kitchen.. 1 by mouth once daily 21)  Prednisone 10 Mg Tabs (Prednisone) .Marland Kitchen.. 1 by mouth once daily 22)  Oxycodone-acetaminophen 10-650 Mg Tabs (Oxycodone-acetaminophen) .Marland Kitchen.. 1 q4h as needed paoin not over 4 per day,  take 2 senna at night 23)  Senokot 8.6 Mg Tabs (Sennosides) .... 2-4 h.s. p.r.n. for constipation  Other Orders: Trigger Point Injection (1 or 2 muscles) (13086) Depo- Medrol 80mg  (J1040) Depo- Medrol 40mg  (J1030)  Appended Document: PAIN IN SHOULDER/CONSTIPATION/CJR     Allergies: 1)  ! Sudafed 2)  ! Codeine 3)  ! Zocor 4)  ! Demerol 5)  ! Caffeine   Impression & Recommendations:  Problem # 1:  MYOSITIS (ICD-729.1) Assessment Deteriorated  His updated medication list for this problem includes:    Sedapap 50-650 Mg Tabs (Butalbital-acetaminophen) .Marland Kitchen... 1-2 q4h as needed headache, does not have caffeine    Oxycodone-acetaminophen 10-650 Mg Tabs (Oxycodone-acetaminophen) .Marland Kitchen... 1 q4h as needed paoin  not over 4 per day, take 2 senna at night    Hydrocodone-acetaminophen 10-325 Mg Tabs (Hydrocodone-acetaminophen) .Marland Kitchen... 1 q4h  as needed for pain Treated area in the middle of the left trapezius ostial, plans for Betadine injected trigger area with Depo-Medrol 120 mg +2 cc of lidocaine Patient tolerated procedure well  Complete Medication List: 1)  Omeprazole 20 Mg Cpdr (Omeprazole) .... Take 1 capsule by mouth once a day 2)  Cardura 2 Mg Tabs (Doxazosin mesylate) .Marland Kitchen.. 1 by mouth once daily. 3)  Triamcinolone Acetonide 0.5 % Crea (Triamcinolone acetonide) .... Apply two times a day 4)  Testim 1 % Gel (Testosterone) .... Apply i tube daily 5)  Prochlorperazine Maleate 10 Mg Tabs (Prochlorperazine maleate) .Marland Kitchen.. 1 by mouth every 6 hrs as needed nausea 6)  Fluticasone Propionate 50 Mcg/act Susp (Fluticasone propionate) .... 2 sprays eaach nostril once daily 7)  Triazolam 0.25 Mg Tabs (Triazolam) .Marland Kitchen.. 1 hs for sleep 8)  Lorazepam 0.5 Mg Tabs (Lorazepam) .Marland Kitchen.. 1 three times a day for stress 9)  Vitamin D (ergocalciferol) 50000 Unit Caps (Ergocalciferol) .Marland KitchenMarland KitchenMarland Kitchen 1 weekly x 12 weeks 10)  Maxzide 75-50 Mg Tabs (Triamterene-hctz) .... Take 1 stat then 1 by mouth each day 11)  Sedapap 50-650 Mg  Tabs (Butalbital-acetaminophen) .Marland Kitchen.. 1-2 q4h as needed headache, does not have caffeine 12)  Xyzal 5 Mg Tabs (Levocetirizine dihydrochloride) .Marland Kitchen.. 1 qd 13)  Proair Hfa 108 (90 Base) Mcg/act Aers (Albuterol sulfate) .... 2 inhalations three times a day as needed wheezing 14)  Vitamin D (ergocalciferol) 50000 Unit Caps (Ergocalciferol) .Marland Kitchen.. 1 by mouth weekly 15)  Prednisone 10 Mg Tabs (Prednisone) .Marland Kitchen.. 1 by mouth once daily 16)  Oxycodone-acetaminophen 10-650 Mg Tabs (Oxycodone-acetaminophen) .Marland Kitchen.. 1 q4h as needed paoin not over 4 per day, take 2 senna at night 17)  Senokot 8.6 Mg Tabs (Sennosides) .... 2-4 h.s. p.r.n. for constipation 18)  Hydrocodone-acetaminophen 10-325 Mg Tabs (Hydrocodone-acetaminophen) .Marland Kitchen.. 1 q4h  as needed for pain

## 2010-11-05 NOTE — Assessment & Plan Note (Signed)
Summary: elevated BP/dm   Vital Signs:  Patient profile:   65 year old male Weight:      200 pounds O2 Sat:      99 % Temp:     98.4 degrees F Pulse rate:   82 / minute BP sitting:   140 / 80  (left arm) Cuff size:   large  Vitals Entered By: Pura Spice, RN (Feb 12, 2010 5:02 PM) CC: states bp goes up at bedtime see list. c/o left shoulder pain cervical pain  has seen cardiologisst in [place]    History of Present Illness: This 65 year old white male is being treated for hypertension and he relates that his blood pressure may need days is elevated at night He iskeeping a record of his blood pressure that he is taking in the a.m. and p.m. and usually in the morning it runs 95-126/76-80 5 in the morning. On several nights it has been 145/82 122/98 and last night or one day ago he was 200/114 the patient has been completely asymptomatic. However he is a very anxious individual and is worried about this variation. Blood pressure today is 140/80. His other complaint is that of pain in his cervical spine which he has had previously as well as some pain radiating to the left shoulder and pain in the left anterior chest which will radiate posteriorly. In the past he said the diagnosis of cervical arthritis and trapezius myositis as well as costochondritis in the left upper chest. He verbalized concern that this may be cardiac in nature it has no pain on exertion no dyspnea no diaphoresis he had a nuclear stress test within the past 6-8 months which was completely normal your distal done by cardiologist in [place] GERD had been well controlled with omeprazole His allergic rhinitis is controlled with fluticasone and fexofenadine He has continued to use Testim 1% gel and he is concerned that this might be causing his blood pressure elevate Headaches controlled with  sedapap  not having as many headaches he has in the past  Allergies: 1)  ! Sudafed 2)  ! Codeine 3)  ! Zocor 4)  ! Demerol 5)   ! Caffeine  Past History:  Past Medical History: Last updated: 09/27/2008 arthritis Asthma GERD Hyperlipidemia Urinary incontinence BPH  ekg   2009 eye exam   2009 colonscopy   2005  repeat  2015   DT     2009 PNEUNOVAX SHINGLES VACCINE SMOKER  Never       Specialist: Dr Tammy Sours Dr Retta Diones  Past Surgical History: Last updated: 12/15/2007 Appendectomy Cholecystectomy Lumbar laminectomy Tonsillectomy  Review of Systems  The patient denies anorexia, fever, weight loss, weight gain, vision loss, decreased hearing, hoarseness, chest pain, syncope, dyspnea on exertion, peripheral edema, prolonged cough, headaches, hemoptysis, abdominal pain, melena, hematochezia, severe indigestion/heartburn, hematuria, incontinence, genital sores, muscle weakness, suspicious skin lesions, transient blindness, difficulty walking, depression, unusual weight change, abnormal bleeding, enlarged lymph nodes, angioedema, breast masses, and testicular masses.    Physical Exam  General:  Well-developed,well-nourished,in no acute distress; alert,appropriate and cooperative throughout examination Head:  Normocephalic and atraumatic without obvious abnormalities. No apparent alopecia or balding. Eyes:  No corneal or conjunctival inflammation noted. EOMI. Perrla. Funduscopic exam benign, without hemorrhages, exudates or papilledema. Vision grossly normal. Ears:  External ear exam shows no significant lesions or deformities.  Otoscopic examination reveals clear canals, tympanic membranes are intact bilaterally without bulging, retraction, inflammation or discharge. Hearing is grossly normal bilaterally. Nose:  minimal nasal  congestion clear drainage Mouth:  Oral mucosa and oropharynx without lesions or exudates.  Teeth in good repair. Neck:  No deformities, masses, or tenderness noted. Chest Wall:  tenderness left costochondral joints 2 through 3 Breasts:  No masses or gynecomastia noted Lungs:   Normal respiratory effort, chest expands symmetrically. Lungs are clear to auscultation, no crackles or wheezes. Heart:  Normal rate and regular rhythm. S1 and S2 normal without gallop, murmur, click, rub or other extra sounds. EKG no abnormalities noted Abdomen:  Bowel sounds positive,abdomen soft and non-tender without masses, organomegaly or hernias noted. Rectal:  not examined Genitalia:  on exam a Prostate:  not examined Msk:  area of tenderness in the left trapezius, marked tenderness with some spasm Pulses:  R and L carotid,radial,femoral,dorsalis pedis and posterior tibial pulses are full and equal bilaterally   Impression & Recommendations:  Problem # 1:  ARTHRITIS, CERVICAL SPINE (ICD-721.90) Assessment Deteriorated prednisone 10 mg decrease in dosage  Problem # 2:  HYPERTENSION, BENIGN ESSENTIAL (ICD-401.1) Assessment: Improved  His updated medication list for this problem includes:    Cardura 2 Mg Tabs (Doxazosin mesylate) .Marland Kitchen... 1 by mouth once daily.    Maxzide 75-50 Mg Tabs (Triamterene-hctz) .Marland Kitchen... Take 1 stat then 1 by mouth each day    Amlodipine Besylate 5 Mg Tabs (Amlodipine besylate) .Marland Kitchen... 1 by mouth once daily  Orders: EKG w/ Interpretation (93000)  Problem # 3:  MYOSITIS (ICD-729.1) Assessment: Deteriorated  His updated medication list for this problem includes:    Celebrex 200 Mg Caps (Celecoxib) .Marland Kitchen... 1 by mouth once daily    Sedapap 50-650 Mg Tabs (Butalbital-acetaminophen) .Marland Kitchen... 1-2 q4h as needed headache, does not have caffeine    Hydrocodone-acetaminophen 10-500 Mg Tabs (Hydrocodone-acetaminophen) .Marland Kitchen... 1 q4h as needed pain not over 4 per day Injected trigger port with Depo-Medrol 120 mg +1% lidocaine  Orders: Trigger Point Injection (1 or 2 muscles) (04540) Depo- Medrol 80mg  (J1040) Depo- Medrol 40mg  (J1030)  Problem # 4:  BACK PAIN, CHRONIC (ICD-724.5) Assessment: Improved  His updated medication list for this problem includes:    Celebrex  200 Mg Caps (Celecoxib) .Marland Kitchen... 1 by mouth once daily    Sedapap 50-650 Mg Tabs (Butalbital-acetaminophen) .Marland Kitchen... 1-2 q4h as needed headache, does not have caffeine    Hydrocodone-acetaminophen 10-500 Mg Tabs (Hydrocodone-acetaminophen) .Marland Kitchen... 1 q4h as needed pain not over 4 per day  Problem # 5:  INSOMNIA (ICD-780.52) Assessment: Improved  His updated medication list for this problem includes:    Triazolam 0.25 Mg Tabs (Triazolam) .Marland Kitchen... 1 hs for sleep  Problem # 6:  HYPOGONADISM, MALE (ICD-257.2) Assessment: Improved Testim 1 % gel daily  Problem # 9:  EDEMA (ICD-782.3) Assessment: Improved  His updated medication list for this problem includes:    Maxzide 75-50 Mg Tabs (Triamterene-hctz) .Marland Kitchen... Take 1 stat then 1 by mouth each day  Problem # 10:  GERD (ICD-530.81) Assessment: Improved  His updated medication list for this problem includes:    Omeprazole 20 Mg Cpdr (Omeprazole) .Marland Kitchen... Take 1 capsule by mouth once a day  Complete Medication List: 1)  Omeprazole 20 Mg Cpdr (Omeprazole) .... Take 1 capsule by mouth once a day 2)  Oxybutynin Chloride 5 Mg Tb24 (Oxybutynin chloride) .... Take 1 tablet by mouth once a day 3)  Cardura 2 Mg Tabs (Doxazosin mesylate) .Marland Kitchen.. 1 by mouth once daily. 4)  Triamcinolone Acetonide 0.5 % Crea (Triamcinolone acetonide) .... Apply two times a day 5)  Fexofenadine Hcl 60 Mg Tabs (Fexofenadine hcl) .Marland KitchenMarland KitchenMarland Kitchen  1 by mouth once daily. 6)  Testim 1 % Gel (Testosterone) .... Apply i tube daily 7)  Prochlorperazine Maleate 10 Mg Tabs (Prochlorperazine maleate) .Marland Kitchen.. 1 by mouth every 6 hrs as needed nausea 8)  Celebrex 200 Mg Caps (Celecoxib) .Marland Kitchen.. 1 by mouth once daily 9)  Fluticasone Propionate 50 Mcg/act Susp (Fluticasone propionate) .... 2 sprays eaach nostril once daily 10)  Triazolam 0.25 Mg Tabs (Triazolam) .Marland Kitchen.. 1 hs for sleep 11)  Lorazepam 0.5 Mg Tabs (Lorazepam) .Marland Kitchen.. 1 three times a day for stress 12)  Vitamin D (ergocalciferol) 50000 Unit Caps (Ergocalciferol)  .Marland KitchenMarland KitchenMarland Kitchen 1 weekly x 12 weeks 13)  Maxzide 75-50 Mg Tabs (Triamterene-hctz) .... Take 1 stat then 1 by mouth each day 14)  Sedapap 50-650 Mg Tabs (Butalbital-acetaminophen) .Marland Kitchen.. 1-2 q4h as needed headache, does not have caffeine 15)  Xyzal 5 Mg Tabs (Levocetirizine dihydrochloride) .Marland Kitchen.. 1 qd 16)  Hydrocodone-acetaminophen 10-500 Mg Tabs (Hydrocodone-acetaminophen) .Marland Kitchen.. 1 q4h as needed pain not over 4 per day 17)  Proair Hfa 108 (90 Base) Mcg/act Aers (Albuterol sulfate) .... 2 inhalations three times a day as needed wheezing 18)  Vitamin D (ergocalciferol) 50000 Unit Caps (Ergocalciferol) .Marland Kitchen.. 1 by mouth weekly 19)  Cephalexin 500 Mg Caps (Cephalexin) .Marland Kitchen.. 1 stat then 1 morning , midafternoon and hs 20)  Amlodipine Besylate 5 Mg Tabs (Amlodipine besylate) .Marland Kitchen.. 1 by mouth once daily 21)  Prednisone 10 Mg Tabs (Prednisone) .Marland Kitchen.. 1 by mouth once daily  Patient Instructions: 1)  Hypertension controlled now 2)  no amlodipine unless BP is over 200 3)  continue cardura if B:P goes up then start 1/2 tab of trampterine HCTZ  4)  call me if any question how well you are doing

## 2010-11-05 NOTE — Progress Notes (Signed)
Summary: numbers needed  Phone Note Call from Patient Call back at Home Phone [phone]   Summary of Call: [facility] will not fill Testim until you fax numbers for testosterone to number you have in my file.  Wants Almira Coaster to call him if any questions.  [facility] faxed you last week asking for lab results.  Please fax to [phone]. Initial call taken by: Rudy Jew, RN,  January 01, 2010 9:59 AM  Follow-up for Phone Call        informed pt he could pick up lab work and mail or fax himself but due to HIPPA we do not fax lab results . Received Prior auth form today and was sent to triage for completion.  Follow-up by: Pura Spice, RN,  January 01, 2010 4:19 PM     Appended Document: numbers needed Pt does not want copies of labs.

## 2010-11-05 NOTE — Medication Information (Signed)
Summary: Coverage Approval for Testim   Coverage Approval for Testim   Imported By: Maryln Gottron 01/07/2010 12:36:14  _____________________________________________________________________  External Attachment:    Type:   Image     Comment:   External Document

## 2010-11-05 NOTE — Progress Notes (Signed)
Summary: lab rsults new rx vit d   Phone Note Outgoing Call   Call placed by:  g udy rn  Call placed to: Patient Summary of Call: pt notified of labs and vit d rx [facility] [facility] [place] Initial call taken by: Pura Spice, RN,  December 13, 2009 3:48 PM    New/Updated Medications: VITAMIN D (ERGOCALCIFEROL) 50000 UNIT CAPS (ERGOCALCIFEROL) 1 by mouth weekly Prescriptions: VITAMIN D (ERGOCALCIFEROL) 50000 UNIT CAPS (ERGOCALCIFEROL) 1 by mouth weekly  #12 x 0   Entered by:   Pura Spice, RN   Authorized by:   Judithann Sheen MD   Signed by:   Pura Spice, RN on 12/13/2009   Method used:   [facility] to        [facility] [phone]* (retail)       [address]       [place], [place]  [address]       Ph: [phone] or [phone]       Fax: [phone]   RxID:   [id number]

## 2010-11-05 NOTE — Progress Notes (Signed)
Summary: BP high  Phone Note Call from Patient Call back at [facility] Phone [phone]   Summary of Call: Wants Dr. Satira Sark to call him today.  BP running a little high.  145/105 today 137/106 yesterday.  Bottom number up last several days.  Face feels flush.  No headache.  A little blurred vision expecially when he wakes up.  Is not blurred now.  Takes Cardura.  Does not take the Maxzide.  [facility] [phone].  Psuedoephedrine keeps him from going to bathroom.    Initial call taken by: Rudy Jew, RN,  January 31, 2010 11:27 AM  Follow-up for Phone Call        per dr Scotty Court amylodpine 5mg  called in and to take daily.  dr Scotty Court wants to see him in 6-8 wks.  Follow-up by: Pura Spice, RN,  January 31, 2010 2:24 PM    New/Updated Medications: AMLODIPINE BESYLATE 5 MG TABS (AMLODIPINE BESYLATE) 1 by mouth once daily AMLODIPINE BESYLATE 5 MG TABS (AMLODIPINE BESYLATE) one by mouth daily Prescriptions: AMLODIPINE BESYLATE 5 MG TABS (AMLODIPINE BESYLATE) 1 by mouth once daily  #30 x 6   Entered by:   Pura Spice, RN   Authorized by:   Judithann Sheen MD   Signed by:   Pura Spice, RN on 01/31/2010   Method used:   Electronically to        [facility] [phone]* (retail)       [address]       [place], [place]  [address]       Ph: [phone] or [phone]       Fax: [phone]   RxID:   [id number]  Pt's wife has this at home, and he will start it.  Appended Document: BP high Pt's BP is 94/74 and pulse 120.  Slept well.  Wants to be seen next week.

## 2010-11-05 NOTE — Progress Notes (Signed)
Summary: refill  Phone Note Refill Request Message from:  Fax from Pharmacy  Refills Requested: Medication #1:  TESTIM 1 % GEL Apply i tube daily [facility]---[phone]    FAX---[phone]  Initial call taken by: Warnell Forester,  November 20, 2009 11:29 AM  Follow-up for Phone Call        needs cpx  scsh and needs to have testesterone level drawn before fillling   Follow-up by: Pura Spice, RN,  November 20, 2009 1:50 PM  Additional Follow-up for Phone Call Additional follow up Details #1::        [facility] needs CPX   Additional Follow-up by: Raechel Ache, RN,  November 20, 2009 1:58 PM

## 2010-11-05 NOTE — Assessment & Plan Note (Signed)
Summary: JOINT PAIN // RS   Vital Signs:  Patient profile:   65 year old male Weight:      197 pounds O2 Sat:      94 % Temp:     98.3140 degrees F Pulse rate:   90 / minute Pulse rhythm:   regular BP sitting:   140 / 80  (left arm)  Vitals Entered By: Pura Spice, RN (May 16, 2010 4:48 PM) CC: "dont feel good joints hurt urine "smell bad "    History of Present Illness: This 65 year old white male relates he doesn't feel good him a headache joint pains as well as some increase urinary frequency urine has an odor some hesitancy on urinating Complains of irritated eyes with drainage and eyes are matted in the morning has occurred for the past week increasing in severity Blood pressure is well controlled Relates GERD is under control with his medication the omeprazole Discussed the fact that the Testim his help him as far as fatigue and libido Edema has been much improved  Allergies: 1)  ! Sudafed 2)  ! Codeine 3)  ! Zocor 4)  ! Demerol 5)  ! Caffeine  Past History:  Past Medical History: Last updated: 09/27/2008 arthritis Asthma GERD Hyperlipidemia Urinary incontinence BPH  ekg   2009 eye exam   2009 colonscopy   2005  repeat  2015   DT     2009 PNEUNOVAX SHINGLES VACCINE SMOKER  Never       Specialist: Dr Tammy Sours Dr Retta Diones  Past Surgical History: Last updated: 12/15/2007 Appendectomy Cholecystectomy Lumbar laminectomy Tonsillectomy  Review of Systems      See HPI  The patient denies anorexia, fever, weight loss, weight gain, vision loss, decreased hearing, hoarseness, chest pain, syncope, dyspnea on exertion, peripheral edema, prolonged cough, headaches, hemoptysis, abdominal pain, melena, hematochezia, severe indigestion/heartburn, hematuria, incontinence, genital sores, muscle weakness, suspicious skin lesions, transient blindness, difficulty walking, depression, unusual weight change, abnormal bleeding, enlarged lymph nodes, angioedema,  breast masses, and testicular masses.    Physical Exam  General:  Well-developed,well-nourished,in no acute distress; alert,appropriate and cooperative throughout examination Head:  Normocephalic and atraumatic without obvious abnormalities. No apparent alopecia or balding. Eyes:  bilateral erythema of over-the-counter Tylenol with slight yellow drainage Ears:  External ear exam shows no significant lesions or deformities.  Otoscopic examination reveals clear canals, tympanic membranes are intact bilaterally without bulging, retraction, inflammation or discharge. Hearing is grossly normal bilaterally. Nose:  External nasal examination shows no deformity or inflammation. Nasal mucosa are pink and moist without lesions or exudates. Mouth:  Oral mucosa and oropharynx without lesions or exudates.  Teeth in good repair. Neck:  No deformities, masses, or tenderness noted. Lungs:  Normal respiratory effort, chest expands symmetrically. Lungs are clear to auscultation, no crackles or wheezes. Heart:  Normal rate and regular rhythm. S1 and S2 normal without gallop, murmur, click, rub or other extra sounds. Rectal:  No external abnormalities noted. Normal sphincter tone. No rectal masses or tenderness. Genitalia:  Testes bilaterally descended without nodularity, tenderness or masses. No scrotal masses or lesions. No penis lesions or urethral discharge. Prostate:  prostate enlarged, boggy and very tender   Impression & Recommendations:  Problem # 1:  CONJUNCTIVITIS, ACUTE, BILATERAL (ICD-372.00) Assessment New TobraDex, dropped t.i.d.  Problem # 2:  UTI (ICD-599.0) Assessment: New  His updated medication list for this problem includes:    Cipro 500 Mg Tabs (Ciprofloxacin hcl) .Marland Kitchen... 1 two times a day for uti and  prostatis urinalysis 1+ leukocytes  Orders: Prescription Created Electronically [phone])  Problem # 3:  ACUTE PROSTATITIS (ICD-601.0) Assessment: New Cipro 500 mg b.i.d. for 2  weeks  Problem # 4:  UNSPECIFIED TESTICULAR DYSFUNCTION (ICD-257.9) Assessment: Improved Testim 1% q. day  Problem # 5:  HYPERTENSION, BENIGN ESSENTIAL (ICD-401.1) Assessment: Improved  His updated medication list for this problem includes:    Cardura 2 Mg Tabs (Doxazosin mesylate) .Marland Kitchen... 1 by mouth once daily.    Maxzide 75-50 Mg Tabs (Triamterene-hctz) .Marland Kitchen... Take 1 stat then 1 by mouth each day  Problem # 6:  EDEMA (ICD-782.3) Assessment: Improved  His updated medication list for this problem includes:    Maxzide 75-50 Mg Tabs (Triamterene-hctz) .Marland Kitchen... Take 1 stat then 1 by mouth each day  Problem # 7:  ARTHRITIS (ICD-716.90) Assessment: Unchanged  Orders: Depo- Medrol 80mg  (J1040) Depo- Medrol 40mg  (J1030) Admin of Therapeutic Inj  intramuscular or subcutaneous (60454)  Complete Medication List: 1)  Omeprazole 20 Mg Cpdr (Omeprazole) .... Take 1 capsule by mouth once a day 2)  Cardura 2 Mg Tabs (Doxazosin mesylate) .Marland Kitchen.. 1 by mouth once daily. 3)  Triamcinolone Acetonide 0.5 % Crea (Triamcinolone acetonide) .... Apply two times a day 4)  Testim 1 % Gel (Testosterone) .... Apply i tube daily 5)  Prochlorperazine Maleate 10 Mg Tabs (Prochlorperazine maleate) .Marland Kitchen.. 1 by mouth every 6 hrs as needed nausea 6)  Fluticasone Propionate 50 Mcg/act Susp (Fluticasone propionate) .... 2 sprays eaach nostril once daily 7)  Triazolam 0.25 Mg Tabs (Triazolam) .Marland Kitchen.. 1 hs for sleep 8)  Lorazepam 0.5 Mg Tabs (Lorazepam) .Marland Kitchen.. 1 three times a day for stress 9)  Vitamin D (ergocalciferol) 50000 Unit Caps (Ergocalciferol) .Marland KitchenMarland KitchenMarland Kitchen 1 weekly x 12 weeks 10)  Maxzide 75-50 Mg Tabs (Triamterene-hctz) .... Take 1 stat then 1 by mouth each day 11)  Sedapap 50-650 Mg Tabs (Butalbital-acetaminophen) .Marland Kitchen.. 1-2 q4h as needed headache, does not have caffeine 12)  Xyzal 5 Mg Tabs (Levocetirizine dihydrochloride) .Marland Kitchen.. 1 qd 13)  Proair Hfa 108 (90 Base) Mcg/act Aers (Albuterol sulfate) .... 2 inhalations three times a  day as needed wheezing 14)  Vitamin D (ergocalciferol) 50000 Unit Caps (Ergocalciferol) .Marland Kitchen.. 1 by mouth weekly 15)  Prednisone 10 Mg Tabs (Prednisone) .Marland Kitchen.. 1 by mouth once daily 16)  Oxycodone-acetaminophen 10-650 Mg Tabs (Oxycodone-acetaminophen) .Marland Kitchen.. 1 q4h as needed paoin not over 4 per day, take 2 senna at night 17)  Senokot 8.6 Mg Tabs (Sennosides) .... 2-4 h.s. p.r.n. for constipation 18)  Hydrocodone-acetaminophen 10-325 Mg Tabs (Hydrocodone-acetaminophen) .Marland Kitchen.. 1 q4h  as needed for pain 19)  Tobradex 0.3-0.1 % Susp (Tobramycin-dexamethasone) .Marland Kitchen.. 1 drop each eye tid 20)  Cipro 500 Mg Tabs (Ciprofloxacin hcl) .Marland Kitchen.. 1 two times a day for uti and prostatis  Other Orders: UA Dipstick w/o Micro (automated)  (81003)  Patient Instructions: 1)  Cipro 500 mg A.m. and p.m. as prescribed dose for urinary tract infection and prostatitis and treatment for 2 weeks urine revealed some hematuria but with a history of calculi is in expected 2)  Conjunctivitis bilaterally to be treated with TobraDex ophthalmic drops 3 times daily 3)  Continue other medications as prescribed Prescriptions: TRIAMCINOLONE ACETONIDE 0.5 %  CREA (TRIAMCINOLONE ACETONIDE) apply two times a day  #60 gms x 11   Entered and Authorized by:   Judithann Sheen MD   Signed by:   Judithann Sheen MD on 05/16/2010   Method used:   [facility] to  [facility]  [address] [phone]* (retail)       [address]       [place], [place]  [address]       Ph: [phone] or [phone]       Fax: [phone]   RxID:   [id number] CIPRO 500 MG TABS (CIPROFLOXACIN HCL) 1 two times a day for UTI and prostatis  #30 x 1   Entered and Authorized by:   Judithann Sheen MD   Signed by:   Judithann Sheen MD on 05/16/2010   Method used:   Electronically to        [facility] [phone]* (retail)       [address]       [place], [place]  [address]        Ph: [phone] or [phone]       Fax: [phone]   RxID:   [id number] TOBRADEX 0.3-0.1 % SUSP (TOBRAMYCIN-DEXAMETHASONE) 1 drop each eye tid  #5.0 cc x 1   Entered and Authorized by:   Judithann Sheen MD   Signed by:   Judithann Sheen MD on 05/16/2010   Method used:   Electronically to        [facility] [phone]* (retail)       [address]       [place], [place]  [address]       Ph: [phone] or [phone]       Fax: [phone]   RxID:   [id number]   Laboratory Results   Urine Tests  Date/Time Recieved: May 16, 2010 3:59 PM  Date/Time Reported: May 16, 2010 3:59 PM   Routine Urinalysis   Color: yellow Appearance: Clear Glucose: negative   (Normal Range: Negative) Bilirubin: negative   (Normal Range: Negative) Ketone: negative   (Normal Range: Negative) Spec. Gravity: 1.020   (Normal Range: 1.003-1.035) Blood: 1+   (Normal Range: Negative) pH: 5.0   (Normal Range: 5.0-8.0) Protein: negative   (Normal Range: Negative) Urobilinogen: 0.2   (Normal Range: 0-1) Nitrite: negative   (Normal Range: Negative) Leukocyte Esterace: 1+   (Normal Range: Negative)    Comments: Wynona Canes, CMA  May 16, 2010 3:59 PM      Medication Administration  Injection # 1:    Medication: Depo- Medrol 80mg     Diagnosis: ARTHRITIS (ICD-716.90)    Route: IM    Site: RUOQ gluteus    Exp Date: 01/2013    Lot #: [id number]    Mfr: Pharmacia    Patient tolerated injection without complications    Given by: Pura Spice, RN (May 16, 2010 5:41 PM)  Injection # 2:    Medication: Depo- Medrol 40mg     Diagnosis: ARTHRITIS (ICD-716.90)    Route: IM    Site: RUOQ gluteus    Exp Date: 01/2013    Lot #: [id number]    Mfr: Pharmacia    Patient tolerated injection without complications    Given by: Pura Spice, RN (May 16, 2010 5:43 PM)  Orders Added: 1)  UA Dipstick w/o Micro (automated)  [81003] 2)  Depo-  Medrol 80mg  [J1040] 3)  Depo- Medrol 40mg  [J1030] 4)  Admin of Therapeutic Inj  intramuscular or subcutaneous [96372] 5)  Prescription Created Electronically [G8553] 6)  Est.  Patient Level IV [[address]]

## 2010-11-05 NOTE — Assessment & Plan Note (Signed)
Summary: COUGH, CONGESTION // RS/PT RESCD//CCM   Vital Signs:  Patient profile:   65 year old male Weight:      199 pounds O2 Sat:      97 % Temp:     98.5 degrees F Pulse rate:   92 / minute BP sitting:   140 / 80  (left arm)  Vitals Entered By: Pura Spice, RN (December 26, 2009 2:15 PM) CC: cough congestion took amoxicillin and cough med    History of Present Illness: This 65 year old white male has had cough and nasal congestion for the past 3-5 days and he feels that it is increasing in severity he did start taking some amoxicillin and left plus cough medicine but wanted to be seen and evaluated for treatment he does have nasal congestion and also productive cough with yellow sputum For his arthritis he had discontinued his Celebrex He desires to discuss the low testosterone and our plan of therapy right knee abdomen severe pain and is to plan to have knee replacement blood pressure under good control no other complaints  Allergies: 1)  ! Sudafed 2)  ! Codeine 3)  ! Zocor 4)  ! Demerol 5)  ! Caffeine  Past History:  Past Medical History: Last updated: 09/27/2008 arthritis Asthma GERD Hyperlipidemia Urinary incontinence BPH  ekg   2009 eye exam   2009 colonscopy   2005  repeat  2015   DT     2009 PNEUNOVAX SHINGLES VACCINE SMOKER  Never       Specialist: Dr Tammy Sours Dr Retta Diones  Past Surgical History: Last updated: 12/15/2007 Appendectomy Cholecystectomy Lumbar laminectomy Tonsillectomy  Review of Systems  The patient denies anorexia, fever, weight loss, weight gain, vision loss, decreased hearing, hoarseness, chest pain, syncope, dyspnea on exertion, peripheral edema, prolonged cough, headaches, hemoptysis, abdominal pain, melena, hematochezia, severe indigestion/heartburn, hematuria, incontinence, genital sores, muscle weakness, suspicious skin lesions, transient blindness, difficulty walking, depression, unusual weight change, abnormal  bleeding, enlarged lymph nodes, angioedema, breast masses, and testicular masses.    Physical Exam  General:  Well-developed,well-nourished,in no acute distress; alert,appropriate and cooperative throughout examination Head:  Normocephalic and atraumatic without obvious abnormalities. No apparent alopecia or balding. Eyes:  No corneal or conjunctival inflammation noted. EOMI. Perrla. Funduscopic exam benign, without hemorrhages, exudates or papilledema. Vision grossly normal. Ears:  External ear exam shows no significant lesions or deformities.  Otoscopic examination reveals clear canals, tympanic membranes are intact bilaterally without bulging, retraction, inflammation or discharge. Hearing is grossly normal bilaterally. Nose:  mod nasal congestion maxillary tendernes bilaterally yellow drainage Mouth:  Oral mucosa and oropharynx without lesions or exudates.  Teeth in good repair. Chest Wall:  No deformities, masses, tenderness or gynecomastia noted. Lungs:  rhonchi bilaterally with expiratory wheezes and occasional rales at both bases Heart:  Normal rate and regular rhythm. S1 and S2 normal without gallop, murmur, click, rub or other extra sounds. Abdomen:  Bowel sounds positive,abdomen soft and non-tender without masses, organomegaly or hernias noted.abdominal scar(s).     Impression & Recommendations:  Problem # 1:  ACUTE BRONCHITIS (ICD-466.0) Assessment New  His updated medication list for this problem includes:    Proair Hfa 108 (90 Base) Mcg/act Aers (Albuterol sulfate) .Marland Kitchen... 2 inhalations three times a day as needed wheezing    Cephalexin 500 Mg Caps (Cephalexin) .Marland Kitchen... 1 stat then 1 morning , midafternoon and hs  Orders: Prescription Created Electronically [phone])  Problem # 2:  WHEEZING (ICD-786.07) Assessment: New Pro air  Problem # 3:  SINUSITIS - ACUTE-NOS (ICD-461.9) Assessment: Deteriorated  His updated medication list for this problem includes:    Fluticasone  Propionate 50 Mcg/act Susp (Fluticasone propionate) .Marland Kitchen... 2 sprays eaach nostril once daily    Cephalexin 500 Mg Caps (Cephalexin) .Marland Kitchen... 1 stat then 1 morning , midafternoon and hs  Problem # 4:  HYPOGONADISM, MALE (ICD-257.2) Assessment: Unchanged  Orders: TLB-Testosterone, Total (84403-TESTO)  Problem # 5:  HYPERTENSION, BENIGN ESSENTIAL (ICD-401.1) Assessment: Improved  His updated medication list for this problem includes:    Cardura 2 Mg Tabs (Doxazosin mesylate) .Marland Kitchen... 1 by mouth once daily.    Maxzide 75-50 Mg Tabs (Triamterene-hctz) .Marland Kitchen... Take 1 stat then 1 by mouth each day  Problem # 6:  ARTHRITIS (ICD-716.90) Assessment: Deteriorated rt knee severe, to plan on replacement  Complete Medication List: 1)  Omeprazole 20 Mg Cpdr (Omeprazole) .... Take 1 capsule by mouth once a day 2)  Oxybutynin Chloride 5 Mg Tb24 (Oxybutynin chloride) .... Take 1 tablet by mouth once a day 3)  Cardura 2 Mg Tabs (Doxazosin mesylate) .Marland Kitchen.. 1 by mouth once daily. 4)  Triamcinolone Acetonide 0.5 % Crea (Triamcinolone acetonide) .... Apply two times a day 5)  Fexofenadine Hcl 60 Mg Tabs (Fexofenadine hcl) .Marland Kitchen.. 1 by mouth once daily. 6)  Testim 1 % Gel (Testosterone) .... Apply i tube daily 7)  Prochlorperazine Maleate 10 Mg Tabs (Prochlorperazine maleate) .Marland Kitchen.. 1 by mouth every 6 hrs as needed nausea 8)  Celebrex 200 Mg Caps (Celecoxib) .Marland Kitchen.. 1 by mouth once daily 9)  Fluticasone Propionate 50 Mcg/act Susp (Fluticasone propionate) .... 2 sprays eaach nostril once daily 10)  Triazolam 0.25 Mg Tabs (Triazolam) .Marland Kitchen.. 1 hs for sleep 11)  Prednisone 10 Mg Tabs (Prednisone) .Marland Kitchen.. 1 tidpc x 4 days, two times a day for 6 days then 1 each day 12)  Lorazepam 0.5 Mg Tabs (Lorazepam) .Marland Kitchen.. 1 three times a day for stress 13)  Vitamin D (ergocalciferol) 50000 Unit Caps (Ergocalciferol) .Marland KitchenMarland KitchenMarland Kitchen 1 weekly x 12 weeks 14)  Maxzide 75-50 Mg Tabs (Triamterene-hctz) .... Take 1 stat then 1 by mouth each day 15)  Sedapap 50-650  Mg Tabs (Butalbital-acetaminophen) .Marland Kitchen.. 1-2 q4h as needed headache, does not have caffeine 16)  Xyzal 5 Mg Tabs (Levocetirizine dihydrochloride) .Marland Kitchen.. 1 qd 17)  Hydrocodone-acetaminophen 10-500 Mg Tabs (Hydrocodone-acetaminophen) .Marland Kitchen.. 1 q4h as needed pain not over 4 per day 18)  Proair Hfa 108 (90 Base) Mcg/act Aers (Albuterol sulfate) .... 2 inhalations three times a day as needed wheezing 19)  Vitamin D (ergocalciferol) 50000 Unit Caps (Ergocalciferol) .Marland Kitchen.. 1 by mouth weekly 20)  Cephalexin 500 Mg Caps (Cephalexin) .Marland Kitchen.. 1 stat then 1 morning , midafternoon and hs  Other Orders: TLB-BMP (Basic Metabolic Panel-BMET) (80048-METABOL)  Patient Instructions: 1)  EM multiple problems including sinusitis maxillary as well as nasal congestion, bronchitis with wheeze and 2)  Degenerative joint disease has increased in severity and the knee and I recommended to proceed with a knee replacement 3)  Type medication as prescribed and if he is not improved or not hesitate to call Prescriptions: CEPHALEXIN 500 MG CAPS (CEPHALEXIN) 1 stat then 1 morning , midafternoon and hs  #30 x 0   Entered and Authorized by:   Judithann Sheen MD   Signed by:   Judithann Sheen MD on 12/26/2009   Method used:   Electronically to        [facility] [phone]* (retail)       [address]       [place], [place]  [address]       Ph: [phone] or [phone]       Fax: [phone]   RxID:   [id number]

## 2010-11-05 NOTE — Progress Notes (Signed)
Summary: REQUEST FOR RX?  Phone Note Call from Patient   Caller: Patient    [phone] Summary of Call: Pt called to request a refill on med:  NEO/POLY/HC SUS OP..... Pt adv he has been exp sxs of itchy, watery eyes, redness to eyes, also adv that eyes are swollen and matted together when he wakes up in the am..Marland KitchenMarland KitchenOffered to allow pt to speak with triage about possibly being worked in for [employer] but pt adv he couldn't get into office before closing.... would like med sent to  [facility] if possible.  Initial call taken by: Debbra Riding,  September 06, 2010 3:55 PM  Follow-up for Phone Call        pt is calling back concerning med Follow-up by: Heron Sabins,  September 09, 2010 4:17 PM  Additional Follow-up for Phone Call Additional follow up Details #1::        call in Cortisporin eye drops (suspension), apply 2 drops to eyes q 4 hours as needed, 10 ml with no rf  Additional Follow-up by: Nelwyn Salisbury MD,  September 09, 2010 5:05 PM    Additional Follow-up for Phone Call Additional follow up Details #2::    Pt called back to ck on request for Rx.  Follow-up by: Debbra Riding,  September 10, 2010 3:35 PM  Additional Follow-up for Phone Call Additional follow up Details #3:: Details for Additional Follow-up Action Taken: done pt aware. Additional Follow-up by: Pura Spice, RN,  September 10, 2010 3:41 PM  New/Updated Medications: CORTISPORIN-TC 3.12-06-08-0.5 MG/ML SUSP (NEOMYCIN-COLIST-HC-THONZONIUM) apply 2 drops to eyes every 4 hours as needed Prescriptions: CORTISPORIN-TC 3.12-06-08-0.5 MG/ML SUSP (NEOMYCIN-COLIST-HC-THONZONIUM) apply 2 drops to eyes every 4 hours as needed  #39ml x 0   Entered by:   Pura Spice, RN   Authorized by:   Nelwyn Salisbury MD   Signed by:   Pura Spice, RN on 09/10/2010   Method used:   Electronically to        [facility]* (retail)       [address], [place]  [id number]     Ph: [phone]       Fax: [phone]   RxID:   [id number]   Appended Document: REQUEST FOR RX?      Appended Document: REQUEST FOR RX? per [facility]ley at [facility] stated needed solution not susp

## 2010-11-05 NOTE — Progress Notes (Signed)
Summary: return call  Phone Note Call from Patient Call back at [phone]   Caller: Patient---live call Summary of Call: Pt states that he is returning Dr Charmian Muff call about lab results. Initial call taken by: Warnell Forester,  April 23, 2010 12:51 PM  Follow-up for Phone Call        called by dr Alfonzo Feller last nite.  Follow-up by: Pura Spice, RN,  April 24, 2010 8:41 AM

## 2010-11-05 NOTE — Progress Notes (Signed)
  Patient is going to [facility]

## 2010-11-05 NOTE — Assessment & Plan Note (Signed)
Summary: fup infection//ccm   Vital Signs:  Patient profile:   65 year old male Weight:      192 pounds Temp:     98.1 degrees F oral BP sitting:   140 / 90  (left arm) Cuff size:   regular  Vitals Entered By: Sid Falcon LPN (June 20, 2010 1:36 PM)  History of Present Illness: Patient here with some recurrent symptoms of urine burning which are mild after completing recent antibiotic. Was seen here on the 11th of last month and diagnosed with acute prostatitis. He had some pyuria and hematuria that time microscopically but not grossly. He has not any fever or chills. Symptoms are very similar to recent prostatitis. He did improve taking ciprofloxacin.  He is planning to go out of state along hunting trip in October and will like to have things cleared up before then  Allergies: 1)  ! Sudafed 2)  ! Codeine 3)  ! Zocor 4)  ! Demerol 5)  ! Caffeine  Past History:  Past Medical History: Last updated: 09/27/2008 arthritis Asthma GERD Hyperlipidemia Urinary incontinence BPH  ekg   2009 eye exam   2009 colonscopy   2005  repeat  2015   DT     2009 PNEUNOVAX SHINGLES VACCINE SMOKER  Never       Specialist: Dr Tammy Sours Dr Retta Diones PMH reviewed for relevance  Physical Exam  General:  Well-developed,well-nourished,in no acute distress; alert,appropriate and cooperative throughout examination Lungs:  Normal respiratory effort, chest expands symmetrically. Lungs are clear to auscultation, no crackles or wheezes. Heart:  normal rate and regular rhythm.     Impression & Recommendations:  Problem # 1:  DYSURIA (ICD-788.1) ?resistent prostatitis.  Repeat 2 week course of Cipro. Orders: UA Dipstick w/o Micro (manual) (13086) Prescription Created Electronically [phone])  His updated medication list for this problem includes:    Ciprofloxacin Hcl 500 Mg Tabs (Ciprofloxacin hcl) ..... One by mouth two times a day for 14 days  Complete Medication List: 1)   Omeprazole 20 Mg Cpdr (Omeprazole) .... Take 1 capsule by mouth once a day 2)  Cardura 2 Mg Tabs (Doxazosin mesylate) .Marland Kitchen.. 1 by mouth once daily. 3)  Triamcinolone Acetonide 0.5 % Crea (Triamcinolone acetonide) .... Apply two times a day 4)  Testim 1 % Gel (Testosterone) .... Apply i tube daily 5)  Prochlorperazine Maleate 10 Mg Tabs (Prochlorperazine maleate) .Marland Kitchen.. 1 by mouth every 6 hrs as needed nausea 6)  Fluticasone Propionate 50 Mcg/act Susp (Fluticasone propionate) .... 2 sprays eaach nostril once daily 7)  Triazolam 0.25 Mg Tabs (Triazolam) .Marland Kitchen.. 1 hs for sleep 8)  Lorazepam 0.5 Mg Tabs (Lorazepam) .Marland Kitchen.. 1 three times a day for stress 9)  Vitamin D (ergocalciferol) 50000 Unit Caps (Ergocalciferol) .Marland KitchenMarland KitchenMarland Kitchen 1 weekly x 12 weeks 10)  Maxzide 75-50 Mg Tabs (Triamterene-hctz) .... Take 1 stat then 1 by mouth each day 11)  Sedapap 50-650 Mg Tabs (Butalbital-acetaminophen) .Marland Kitchen.. 1-2 q4h as needed headache, does not have caffeine 12)  Xyzal 5 Mg Tabs (Levocetirizine dihydrochloride) .Marland Kitchen.. 1 qd 13)  Proair Hfa 108 (90 Base) Mcg/act Aers (Albuterol sulfate) .... 2 inhalations three times a day as needed wheezing 14)  Oxycodone-acetaminophen 10-650 Mg Tabs (Oxycodone-acetaminophen) .Marland Kitchen.. 1 q4h as needed paoin not over 4 per day, take 2 senna at night 15)  Senokot 8.6 Mg Tabs (Sennosides) .... 2-4 h.s. p.r.n. for constipation 16)  Hydrocodone-acetaminophen 10-325 Mg Tabs (Hydrocodone-acetaminophen) .Marland Kitchen.. 1 q4h  as needed for pain 17)  Tobradex 0.3-0.1 %  Susp (Tobramycin-dexamethasone) .Marland Kitchen.. 1 drop each eye tid 18)  Hyoscyamine Sulfate Cr 0.375 Mg Xr12h-tab (Hyoscyamine sulfate) .Marland Kitchen.. 1 two times a day for irritable bowel 19)  Celebrex 200 Mg Caps (Celecoxib) .Marland Kitchen.. 1 two times a day for arthritis 20)  Lunesta 3 Mg Tabs (Eszopiclone) .Marland Kitchen.. 1 hs for sleep 21)  Ciprofloxacin Hcl 500 Mg Tabs (Ciprofloxacin hcl) .... One by mouth two times a day for 14 days  Patient Instructions: 1)  drink plenty of fluids. 2)   Followup in couple of weeks here if symptoms have not resolved Prescriptions: CIPROFLOXACIN HCL 500 MG TABS (CIPROFLOXACIN HCL) one by mouth two times a day for 14 days  #28 x 0   Entered and Authorized by:   Evelena Peat MD   Signed by:   Evelena Peat MD on 06/20/2010   Method used:   [facility]        [facility] [phone][facility] (retail)       [address]       [place], [place]  [address]       Ph: [phone] or [phone]       Fax: [phone]   RxID:   [id number]   Laboratory Results   Urine Tests  Date/Time Received: June 20, 2010  Date/Time Reported: June 20, 2010   Routine Urinalysis   Color: yellow Appearance: Clear Glucose: negative   (Normal Range: Negative) Bilirubin: negative   (Normal Range: Negative) Ketone: negative   (Normal Range: Negative) Spec. Gravity: 1.020   (Normal Range: 1.003-1.035) Blood: small    (Normal Range: Negative) pH: 5.0   (Normal Range: 5.0-8.0) Protein: negative   (Normal Range: Negative) Urobilinogen: 0.2   (Normal Range: 0-1) Nitrite: negative   (Normal Range: Negative) Leukocyte Esterace: negative   (Normal Range: Negative)    Comments: Kathrynn Speed CMA  June 20, 2010 2:11 PM

## 2010-11-07 NOTE — Progress Notes (Signed)
Summary: REFILL REQUEST  Phone Note Refill Request Call back at [phone] Message from:  Patient on September 19, 2010 11:33 AM  Refills Requested: Medication #1:  OXYCODONE-ACETAMINOPHEN 10-650 MG TABS 1 q4h as needed paoin not over 4 per day   [facility] [address] [facility] [place].    Initial call taken by: Debbra Riding,  September 19, 2010 11:33 AM  Follow-up for Phone Call        rx up front ready for p/u, pt aware Follow-up by: Alfred Levins, CMA,  October 02, 2010 12:07 PM    Prescriptions: OXYCODONE-ACETAMINOPHEN 10-650 MG TABS (OXYCODONE-ACETAMINOPHEN) 1 q4h as needed paoin not over 4 per day, take 2 senna at night  #100 x 0   Entered by:   Alfred Levins, CMA   Authorized by:   Judithann Sheen MD   Signed by:   Alfred Levins, CMA on 10/02/2010   Method used:   Print then Give to Patient   RxID:   [id number]

## 2010-11-07 NOTE — Progress Notes (Signed)
Summary: rx request  Phone Note Refill Request Message from:  Fax from Pharmacy on October 29, 2010 2:26 PM  okay per dr Scotty Court  Initial call taken by: Kern Reap CMA Duncan Dull),  October 29, 2010 3:10 PM    New/Updated Medications: ALPRAZOLAM 0.25 MG TABS (ALPRAZOLAM) take one tab three times a day as needed Prescriptions: ALPRAZOLAM 0.25 MG TABS (ALPRAZOLAM) take one tab three times a day as needed  #90 x 5   Entered by:   Kern Reap CMA (AAMA)   Authorized by:   Judithann Sheen MD   Signed by:   Kern Reap CMA (AAMA) on 10/29/2010   Method used:   [facility]ph[facility] to ...       [facility]  [address] [phone]* (retail)       [address]       [place], [place]  [address]       Ph: [phone] or [phone]       Fax: [phone]   RxID:   [id number]

## 2010-11-07 NOTE — Assessment & Plan Note (Signed)
Summary: cough/congestion/njr   Vital Signs:  Patient profile:   65 year old male Weight:      198 pounds Pulse rate:   82 / minute BP sitting:   130 / 90  (left arm)  Vitals Entered By: Kyung Rudd, CMA (October 15, 2010 1:05 PM) CC: pt c/o bodyaches, earaches, itchy watery eyes, cough, congestion, ST...since Dec 2 and gotten progressively worse since   CC:  pt c/o bodyaches, earaches, itchy watery eyes, cough, congestion, and ST...since Dec 2 and gotten progressively worse since.  History of Present Illness: Patient presents to clinic as a workin for evaluation of cough. Notes 7d h/o myalgias, yellow eye drainage, yellow nasal drainage, ST and np cough. Denies f/c, sick exposure or alleviating/exacerbating factors. S/p course of keflex. Recently taking mucinex and abx eye gtt.  Preventive Screening-Counseling & Management  Alcohol-Tobacco     Smoking Status: never  Current Medications (verified): 1)  Omeprazole 20 Mg Cpdr (Omeprazole) .... Take 1 Capsule By Mouth Once A Day 2)  Cardura 2 Mg  Tabs (Doxazosin Mesylate) .Marland Kitchen.. 1 By Mouth Once Daily. 3)  Triamcinolone Acetonide 0.5 %  Crea (Triamcinolone Acetonide) .... Apply Two Times A Day 4)  Testim 1 % Gel (Testosterone) .... Apply I Tube Daily 5)  Prochlorperazine Maleate 10 Mg Tabs (Prochlorperazine Maleate) .Marland Kitchen.. 1 By Mouth Every 6 Hrs As Needed Nausea 6)  Fluticasone Propionate 50 Mcg/act Susp (Fluticasone Propionate) .... 2 Sprays [place] Nostril Once Daily 7)  Triazolam 0.25 Mg Tabs (Triazolam) .Marland Kitchen.. 1 Hs For Sleep 8)  Lorazepam 0.5 Mg Tabs (Lorazepam) .Marland Kitchen.. 1 Three Times A Day For Stress 9)  Vitamin D (Ergocalciferol) 50000 Unit Caps (Ergocalciferol) .Marland Kitchen.. 1 Weekly X 12 Weeks 10)  Maxzide 75-50 Mg Tabs (Triamterene-Hctz) .... Take 1 Stat Then 1 By Mouth Each Day 11)  Sedapap 50-650 Mg Tabs (Butalbital-Acetaminophen) .Marland Kitchen.. 1-2 Q4h As Needed Headache, Does Not Have Caffeine 12)  Xyzal 5 Mg Tabs (Levocetirizine Dihydrochloride)  .Marland Kitchen.. 1 Qd 13)  Proair Hfa 108 (90 Base) Mcg/act Aers (Albuterol Sulfate) .... 2 Inhalations Three Times A Day As Needed Wheezing 14)  Oxycodone-Acetaminophen 10-650 Mg Tabs (Oxycodone-Acetaminophen) .Marland Kitchen.. 1 Q4h As Needed Paoin Not Over 4 Per Day, Take 2 Senna At Night 15)  Senokot 8.6 Mg Tabs (Sennosides) .... 2-4 H.s. P.r.n. For Constipation 16)  Hydrocodone-Acetaminophen 10-325 Mg Tabs (Hydrocodone-Acetaminophen) .Marland Kitchen.. 1 Q4h  As Needed For Pain 17)  Tobradex 0.3-0.1 % Susp (Tobramycin-Dexamethasone) .Marland Kitchen.. 1 Drop Each Eye Tid 18)  Hyoscyamine Sulfate Cr 0.375 Mg Xr12h-Tab (Hyoscyamine Sulfate) .Marland Kitchen.. 1 Two Times A Day For Irritable Bowel 19)  Celebrex 200 Mg Caps (Celecoxib) .Marland Kitchen.. 1 Two Times A Day For Arthritis 20)  Lunesta 3 Mg Tabs (Eszopiclone) .Marland Kitchen.. 1 Hs For Sleep 21)  Ciprofloxacin Hcl 500 Mg Tabs (Ciprofloxacin Hcl) .... One By Mouth Two Times A Day For 14 Days 22)  Cyclobenzaprine Hcl 10 Mg Tabs (Cyclobenzaprine Hcl) .... 1/2 To 1 Tab By Mouth At Bedtime As Needed Pain 23)  Flector 1.3 % Ptch (Diclofenac Epolamine) .... Apply Patch To Affected Area Q 12 Hours 24)  Rapaflo 8 Mg Caps (Silodosin) .Marland Kitchen.. 1 Tab By Mouth At Bedtime 25)  Cortisporin 3.5-10000-1 Soln (Neomycin-Polymyxin-Hc) .... Apply Two Drops To Eyes Every 4 Hrs As Needed  Allergies (verified): 1)  ! Sudafed 2)  ! Codeine 3)  ! Zocor 4)  ! Demerol 5)  ! Caffeine  Past History:  Past medical, surgical, family and social histories (including risk factors) reviewed, and no  changes noted (except as noted below).  Past Medical History: Reviewed history from 09/27/2008 and no changes required. arthritis Asthma GERD Hyperlipidemia Urinary incontinence BPH  ekg   2009 eye exam   2009 colonscopy   2005  repeat  2015   DT     2009 PNEUNOVAX SHINGLES VACCINE SMOKER  Never       Specialist: Dr Tammy Sours Dr Retta Diones  Past Surgical History: Reviewed history from 12/15/2007 and no changes  required. Appendectomy Cholecystectomy Lumbar laminectomy Tonsillectomy  Family History: Reviewed history and no changes required.  Social History: Reviewed history and no changes required. Never Smoked Smoking Status:  never  Review of Systems      See HPI  Physical Exam  General:  Well-developed,well-nourished,in no acute distress; alert,appropriate and cooperative throughout examination Head:  Normocephalic and atraumatic without obvious abnormalities. No apparent alopecia or balding. Eyes:  pupils equal, pupils round, corneas and lenses clear, and no injection.  no drainage noted Ears:  External ear exam shows no significant lesions or deformities.  Otoscopic examination reveals clear canals, tympanic membranes are intact bilaterally without bulging, retraction, inflammation or discharge. Hearing is grossly normal bilaterally. Nose:  External nasal examination shows no deformity or inflammation. Nasal mucosa are pink and moist without lesions or exudates. Mouth:  Oral mucosa and oropharynx without lesions or exudates.  Teeth in good repair. Neck:  No deformities, masses, or tenderness noted. Lungs:  Normal respiratory effort, chest expands symmetrically. Lungs are clear to auscultation, no crackles or wheezes. Skin:  turgor normal, color normal, and no rashes.     Impression & Recommendations:  Problem # 1:  URI (ICD-465.9) Assessment New Begin doxycycline. Followup if no improvement or worsening.  Complete Medication List: 1)  Omeprazole 20 Mg Cpdr (Omeprazole) .... Take 1 capsule by mouth once a day 2)  Cardura 2 Mg Tabs (Doxazosin mesylate) .Marland Kitchen.. 1 by mouth once daily. 3)  Triamcinolone Acetonide 0.5 % Crea (Triamcinolone acetonide) .... Apply two times a day 4)  Testim 1 % Gel (Testosterone) .... Apply i tube daily 5)  Prochlorperazine Maleate 10 Mg Tabs (Prochlorperazine maleate) .Marland Kitchen.. 1 by mouth every 6 hrs as needed nausea 6)  Fluticasone Propionate 50 Mcg/act Susp  (Fluticasone propionate) .... 2 sprays eaach nostril once daily 7)  Triazolam 0.25 Mg Tabs (Triazolam) .Marland Kitchen.. 1 hs for sleep 8)  Lorazepam 0.5 Mg Tabs (Lorazepam) .Marland Kitchen.. 1 three times a day for stress 9)  Vitamin D (ergocalciferol) 50000 Unit Caps (Ergocalciferol) .Marland KitchenMarland KitchenMarland Kitchen 1 weekly x 12 weeks 10)  Maxzide 75-50 Mg Tabs (Triamterene-hctz) .... Take 1 stat then 1 by mouth each day 11)  Sedapap 50-650 Mg Tabs (Butalbital-acetaminophen) .Marland Kitchen.. 1-2 q4h as needed headache, does not have caffeine 12)  Xyzal 5 Mg Tabs (Levocetirizine dihydrochloride) .Marland Kitchen.. 1 qd 13)  Proair Hfa 108 (90 Base) Mcg/act Aers (Albuterol sulfate) .... 2 inhalations three times a day as needed wheezing 14)  Oxycodone-acetaminophen 10-650 Mg Tabs (Oxycodone-acetaminophen) .Marland Kitchen.. 1 q4h as needed paoin not over 4 per day, take 2 senna at night 15)  Senokot 8.6 Mg Tabs (Sennosides) .... 2-4 h.s. p.r.n. for constipation 16)  Hydrocodone-acetaminophen 10-325 Mg Tabs (Hydrocodone-acetaminophen) .Marland Kitchen.. 1 q4h  as needed for pain 17)  Tobradex 0.3-0.1 % Susp (Tobramycin-dexamethasone) .Marland Kitchen.. 1 drop each eye tid 18)  Hyoscyamine Sulfate Cr 0.375 Mg Xr12h-tab (Hyoscyamine sulfate) .Marland Kitchen.. 1 two times a day for irritable bowel 19)  Celebrex 200 Mg Caps (Celecoxib) .Marland Kitchen.. 1 two times a day for arthritis 20)  Lunesta 3 Mg  Tabs (Eszopiclone) .Marland Kitchen.. 1 hs for sleep 21)  Ciprofloxacin Hcl 500 Mg Tabs (Ciprofloxacin hcl) .... One by mouth two times a day for 14 days 22)  Cyclobenzaprine Hcl 10 Mg Tabs (Cyclobenzaprine hcl) .... 1/2 to 1 tab by mouth at bedtime as needed pain 23)  Flector 1.3 % Ptch (Diclofenac epolamine) .... Apply patch to affected area q 12 hours 24)  Rapaflo 8 Mg Caps (Silodosin) .Marland Kitchen.. 1 tab by mouth at bedtime 25)  Cortisporin 3.5-10000-1 Soln (Neomycin-polymyxin-hc) .... Apply two drops to eyes every 4 hrs as needed 26)  Doxycycline Hyclate 100 Mg Tabs (Doxycycline hyclate) .... One by mouth bid Prescriptions: DOXYCYCLINE HYCLATE 100 MG TABS  (DOXYCYCLINE HYCLATE) one by mouth bid  #14 x 0   Entered and Authorized by:   Edwyna Perfect MD   Signed by:   Edwyna Perfect MD on 10/15/2010   Method used:   Electronically to        [facility]* (retail)       [address], [place]  [id number]       Ph: [phone]       Fax: [phone]   RxID:   [id number]    Orders Added: 1)  Est. Patient Level III [24401]

## 2010-11-07 NOTE — Consult Note (Signed)
Summary: [facility]  [facility]   Imported By: Lennie Odor 09/24/2010 11:40:52  _____________________________________________________________________  External Attachment:    Type:   Image     Comment:   External Document  Appended Document: [facility] reviewed

## 2010-11-15 ENCOUNTER — Other Ambulatory Visit: Payer: Self-pay | Admitting: Family Medicine

## 2010-11-19 ENCOUNTER — Telehealth: Payer: Self-pay | Admitting: Family Medicine

## 2010-11-19 NOTE — Telephone Encounter (Signed)
Pt is sch for cpx on 12/24/10 at 10am, and pt is req to get an order to have his cpx labs done at [facility] in [place]. Pls advise.

## 2010-12-14 ENCOUNTER — Encounter: Payer: Self-pay | Admitting: Family Medicine

## 2010-12-24 ENCOUNTER — Encounter: Payer: Self-pay | Admitting: Family Medicine

## 2010-12-24 ENCOUNTER — Ambulatory Visit (INDEPENDENT_AMBULATORY_CARE_PROVIDER_SITE_OTHER): Payer: Medicare Other | Admitting: Family Medicine

## 2010-12-24 VITALS — BP 124/94 | HR 95 | Temp 98.5°F | Ht 70.0 in | Wt 193.0 lb

## 2010-12-24 DIAGNOSIS — E291 Testicular hypofunction: Secondary | ICD-10-CM

## 2010-12-24 DIAGNOSIS — R351 Nocturia: Secondary | ICD-10-CM

## 2010-12-24 DIAGNOSIS — N401 Enlarged prostate with lower urinary tract symptoms: Secondary | ICD-10-CM

## 2010-12-24 DIAGNOSIS — D649 Anemia, unspecified: Secondary | ICD-10-CM

## 2010-12-24 DIAGNOSIS — E039 Hypothyroidism, unspecified: Secondary | ICD-10-CM

## 2010-12-24 DIAGNOSIS — I10 Essential (primary) hypertension: Secondary | ICD-10-CM

## 2010-12-24 DIAGNOSIS — E785 Hyperlipidemia, unspecified: Secondary | ICD-10-CM

## 2010-12-24 LAB — POCT URINALYSIS DIPSTICK
Bilirubin, UA: NEGATIVE
Glucose, UA: NEGATIVE
Ketones, UA: NEGATIVE
Spec Grav, UA: 1.02
Urobilinogen, UA: 0.2

## 2010-12-24 LAB — BASIC METABOLIC PANEL
BUN: 31 mg/dL — ABNORMAL HIGH (ref 6–23)
Chloride: 102 mEq/L (ref 96–112)
Creatinine, Ser: 1.6 mg/dL — ABNORMAL HIGH (ref 0.4–1.5)
GFR: 45.38 mL/min — ABNORMAL LOW (ref >60.00)

## 2010-12-24 LAB — CBC WITH DIFFERENTIAL/PLATELET
Basophils Relative: 0.3 % (ref 0.0–3.0)
Eosinophils Relative: 0.8 % (ref 0.0–5.0)
HCT: 49.1 % (ref 39.0–52.0)
Hemoglobin: 16.7 g/dL (ref 13.0–17.0)
Lymphs Abs: 1.4 10*3/uL (ref 0.7–4.0)
MCV: 90.2 fl (ref 78.0–100.0)
Monocytes Absolute: 0.9 10*3/uL (ref 0.1–1.0)
Monocytes Relative: 10 % (ref 3.0–12.0)
Neutro Abs: 6.9 10*3/uL (ref 1.4–7.7)
RBC: 5.44 Mil/uL (ref 4.22–5.81)
WBC: 9.3 10*3/uL (ref 4.5–10.5)

## 2010-12-24 LAB — LIPID PANEL
Cholesterol: 197 mg/dL (ref 0–200)
LDL Cholesterol: 125 mg/dL — ABNORMAL HIGH (ref 0–99)
Total CHOL/HDL Ratio: 4
VLDL: 20.6 mg/dL (ref 0.0–40.0)

## 2010-12-24 LAB — PSA: PSA: 2.33 ng/mL (ref 0.10–4.00)

## 2010-12-24 LAB — HEPATIC FUNCTION PANEL
AST: 18 U/L (ref 0–37)
Albumin: 4.1 g/dL (ref 3.5–5.2)
Total Bilirubin: 0.8 mg/dL (ref 0.3–1.2)

## 2010-12-24 LAB — TSH: TSH: 2.64 u[IU]/mL (ref 0.35–5.50)

## 2010-12-24 MED ORDER — OXYCODONE-ACETAMINOPHEN 10-650 MG PO TABS: 1.0000 | ORAL_TABLET | Freq: Four times a day (QID) | ORAL | Status: AC | PRN

## 2010-12-24 MED ORDER — OXYCODONE-ACETAMINOPHEN 10-650 MG PO TABS: 1.0000 | ORAL_TABLET | ORAL | Status: DC | PRN

## 2010-12-24 MED ORDER — TRIAZOLAM 0.25 MG PO TABS: 0.2500 mg | ORAL_TABLET | Freq: Every evening | ORAL | Status: DC | PRN

## 2010-12-24 NOTE — Patient Instructions (Addendum)
You are doing fine even thoigh you are fatiqued, we will do lab  Studies and I will call you the results Will refill medications Start taking Norvasc every other day for blood presure, if you have edema then call me As far as the fatigue we may have to reconsider as far as the hypogonadism and from the testosterone medication

## 2010-12-26 ENCOUNTER — Telehealth: Payer: Self-pay | Admitting: Family Medicine

## 2010-12-26 NOTE — Telephone Encounter (Signed)
Pt would like to give Dr. Scotty Court his BP readings; would like a return call

## 2010-12-27 ENCOUNTER — Other Ambulatory Visit: Payer: Self-pay | Admitting: Family Medicine

## 2010-12-29 ENCOUNTER — Encounter: Payer: Self-pay | Admitting: Family Medicine

## 2010-12-29 NOTE — Progress Notes (Signed)
  Subjective:    Patient ID: [name], male    DOB: [birth year], 65 y.o.   MRN: [id number]  HPI    Review of Systems     Objective:   Physical Exam        Assessment & Plan:  Electrocardiogram we'll sinus rhythm and is essentially normal with no acute problem and no concern

## 2010-12-29 NOTE — Progress Notes (Signed)
  Subjective:    Patient ID: [name], male    DOB: 02-07-1946, 65 y.o.   MRN: [id number]  This 65 year old white married male he is in to discuss his multiple medical problems administered her lab studies and refill his medications he has history of hypertension which is well-controlled he continues to have some edema at times and needs a urinary wound his major complaints are that of arthritis of the hands especially of his knees which is not sure that he can take just take NSAIDs unfortunately his right knee has gotten much more severe over the past 6-9 months He occasionally has myositis or trigger points the knee injected over his prior scapular muscles Continues to have some problem with insomnia and is going to try Lunesta 3 mg h.s. He states his wife has had 2 kidney transplants at [facility] and is doing well Continues to have not sure if he does not take his doxazozin Chronic allergic rhinitis is controlled with Flonase Irritable bowel syndrome controlledwith Levbid He has occasional episodes of anxiety and stress which is helped with Ativan Blood pressure control BP 124/94 today HPI    Review of Systemssee history of present illness     Objective:   Physical Exam the patient is a well-developed well-nourished white male who is in no distress and has no major complaints at this time, vertical operative and pleasant HEENT reveals evidence of nasal mucosa pale boggy with slight exudate otherwise negative carotid pulses good thyroid is nonpalpable Lungs are clear to palpation and percussion auscultation no rales no rhonchi no wheezing Heart no cardiomegaly heart sounds without murmur peripheral pulses good equal rhythm regular Abdomen liver spleen and kidneys nonpalpable aorta normal size no masses bowel sounds minimally hyperactive Genitalia negative Rectal examination reveals prostate to be enlarged x1 Extremities tenderness PIP joint as well as his right knee  is slightly swollen crepitation on flexion tenderness medial and lateral menisci Back examination negative despite his chronic back pain No muscular tenderness at this time Neurological examination negative Skin no nevi       Assessment & Plan:  Assessment of the patient reveals him to be doing well another control with his medication to evaluate laboratory studies Hypertension is controlled no change in medication Arthritis increased in severity to change medication to prednisone Anxiety controlled with alprazolam Insomnia to change medication Irritable bowel syndrome to continue Levbid

## 2010-12-31 ENCOUNTER — Other Ambulatory Visit: Payer: Self-pay | Admitting: Family Medicine

## 2010-12-31 ENCOUNTER — Telehealth: Payer: Self-pay | Admitting: Family Medicine

## 2010-12-31 MED ORDER — ALBUTEROL SULFATE HFA 108 (90 BASE) MCG/ACT IN AERS: 2.0000 | INHALATION_SPRAY | Freq: Three times a day (TID) | RESPIRATORY_TRACT | Status: DC

## 2010-12-31 NOTE — Telephone Encounter (Signed)
[facility] [place] [facility] proair hfa 108

## 2010-12-31 NOTE — Telephone Encounter (Signed)
bp reaings: 3-20 145/93 138/84, 3/21 138/113, 141/116; 3/21: 124/83 3/22: 113/111, 137/86; 3/22 143/91; 3/23 146/83, 157/90; 148/89, 146/117; 3/25 147/87, 142/112 164/126, 3/26 110/88, 128/110, 141/83; 3/27: 118/82

## 2011-01-01 ENCOUNTER — Encounter: Payer: Self-pay | Admitting: Family Medicine

## 2011-01-02 ENCOUNTER — Telehealth: Payer: Self-pay | Admitting: *Deleted

## 2011-01-02 DIAGNOSIS — I10 Essential (primary) hypertension: Secondary | ICD-10-CM

## 2011-01-02 MED ORDER — TRIAMTERENE-HCTZ 75-50 MG PO TABS: 1.0000 | ORAL_TABLET | Freq: Every day | ORAL | Status: DC

## 2011-01-02 NOTE — Telephone Encounter (Signed)
Pt would like his lab results and he said he never received his rx from [facility] for a cream

## 2011-01-03 ENCOUNTER — Other Ambulatory Visit: Payer: Self-pay | Admitting: Family Medicine

## 2011-01-03 MED ORDER — TRIAMCINOLONE ACETONIDE 0.5 % EX CREA: 1.0000 "application " | TOPICAL_CREAM | Freq: Two times a day (BID) | CUTANEOUS | Status: DC

## 2011-01-03 NOTE — Telephone Encounter (Signed)
Done; called pt to give lab results will call again on Monday; rx faxed to pharmacy

## 2011-01-06 ENCOUNTER — Ambulatory Visit (INDEPENDENT_AMBULATORY_CARE_PROVIDER_SITE_OTHER): Payer: Medicare Other | Admitting: Family Medicine

## 2011-01-06 ENCOUNTER — Encounter: Payer: Self-pay | Admitting: Family Medicine

## 2011-01-06 VITALS — BP 140/80 | HR 97 | Temp 98.5°F | Wt 199.0 lb

## 2011-01-06 DIAGNOSIS — G5600 Carpal tunnel syndrome, unspecified upper limb: Secondary | ICD-10-CM

## 2011-01-06 NOTE — Progress Notes (Signed)
  Subjective:    Patient ID: [name], male    DOB: [birth year], 65 y.o.   MRN: [id number]  HPI Here with 2 days of numbness and tingling in the right hand. This started after he worked in his garden for several hours, digging with a mattock. At first the whole hand was numb, now it is just the 4th and 5th fingers. No pain or swelling. He does see Dr. Ethelene Hal for neck and upper back problems. He gets ESI injections twice a year. No chest pain or SOB. He had a cxp 2 weeks ago with a normal exam and normal labs.    Review of Systems  Constitutional: Negative.   Respiratory: Negative.   Cardiovascular: Negative.   Neurological: Positive for numbness. Negative for weakness.       Objective:   Physical Exam  Constitutional: He is oriented to person, place, and time. He appears well-developed and well-nourished.  Musculoskeletal: Normal range of motion. He exhibits no edema and no tenderness.  Neurological: He is alert and oriented to person, place, and time. He displays normal reflexes. No cranial nerve deficit. He exhibits normal muscle tone. Coordination normal.          Assessment & Plan:  This is carpal tunnel syndrome. I do not think it is from the neck or back. Rest. Advised him to wear a wrist brace for several weeks.

## 2011-01-07 NOTE — Telephone Encounter (Signed)
To alter BP tx

## 2011-01-13 ENCOUNTER — Telehealth: Payer: Self-pay

## 2011-01-13 NOTE — Telephone Encounter (Signed)
Pt has called and is running out of Norvasc due to taking it two times a day and needs a new rx.  Pt called back and said he thinks the blood pressure medication is causing his feet to swell.  Also Dr. Scotty Court called in 0,5% cream and he requested 1%.  Pt would like this called.  Pt called back and said he has been off blood pressure medicine for 2 days as of Friday and needs to know what to do about his blood pressure because he feels it is causing his feet to swell.  Please advise

## 2011-01-14 ENCOUNTER — Ambulatory Visit (INDEPENDENT_AMBULATORY_CARE_PROVIDER_SITE_OTHER): Payer: Medicare Other | Admitting: Family Medicine

## 2011-01-14 VITALS — BP 102/70 | HR 110 | Temp 98.1°F | Ht 72.0 in | Wt 197.0 lb

## 2011-01-14 DIAGNOSIS — J45909 Unspecified asthma, uncomplicated: Secondary | ICD-10-CM

## 2011-01-14 DIAGNOSIS — R059 Cough, unspecified: Secondary | ICD-10-CM

## 2011-01-14 DIAGNOSIS — R05 Cough: Secondary | ICD-10-CM

## 2011-01-14 DIAGNOSIS — M129 Arthropathy, unspecified: Secondary | ICD-10-CM

## 2011-01-14 DIAGNOSIS — M1990 Unspecified osteoarthritis, unspecified site: Secondary | ICD-10-CM

## 2011-01-14 DIAGNOSIS — R51 Headache: Secondary | ICD-10-CM

## 2011-01-14 NOTE — Telephone Encounter (Signed)
called

## 2011-01-14 NOTE — Telephone Encounter (Signed)
appt April 10

## 2011-01-14 NOTE — Telephone Encounter (Signed)
Pt to come in for tx

## 2011-01-14 NOTE — Patient Instructions (Signed)
You have an upper respiratory infection as well as bronchitis with slight wheezing Will tx with cipro, also albuterol inhaler as needed ,  Take maximum strength mucinex twice daily Start prednisone for joint pains as well as rash Stay well hydrated

## 2011-01-16 MED ORDER — CIPROFLOXACIN HCL 500 MG PO TABS: ORAL_TABLET | ORAL | Status: DC

## 2011-01-16 MED ORDER — PREDNISONE 20 MG PO TABS: ORAL_TABLET | ORAL | Status: DC

## 2011-01-16 NOTE — Telephone Encounter (Signed)
[facility] [address] [facility]. Saw Dr Scotty Court this week.

## 2011-01-16 NOTE — Telephone Encounter (Signed)
rx phoned in to pharmacy for cipro 500 and prednisone 20 mg;  Attempted to call pt but no answer will call again on 01/17/2011

## 2011-01-29 MED ORDER — CIPROFLOXACIN HCL 500 MG PO TABS: 500.0000 mg | ORAL_TABLET | Freq: Two times a day (BID) | ORAL | Status: AC

## 2011-01-29 NOTE — Progress Notes (Signed)
  Subjective:    Patient ID: [name], male    DOB: July 07, 1946, 65 y.o.   MRN: [id number] This 65 year old white married male complains today of cough congestion headache chest congestion with wheezing all over the past 5 days. He also continues of continued arthritic pain and we have both discussed and agreed to restart prednisone for short time He continues to have chronic back pain, her past patient is better and at times continues to have irritable bowel syndrome with occasional diarrhea,2 continue hyoscyamine  sulfate Urinary symptoms under control with medication HPI    Review of Systemssee history of present illness     Objective:   Physical Exam the patient is a well-developed well-nourished male who appears acutely ill and coughing HEENT nasal congestion erythematous mucosa with yellow drainage slight tenderness over the maxillary sinuses also referred somewhat erythematous small anterior nodes Chest examination reveals rhonchi bilaterally with expiratory wheezes occasional rales no dullness to percussion Heart examination reveals no cardiomegaly heart sounds are good without murmurs  regular rhythma Abdominal exam negative trace peripheral edema bilaterally       Assessment & Plan:   upper respiratory  infection and asthmatic bronchitis,2 treat with maximum strength mucinex, albuterolinhaler, Cipro 500 mg b.i.d. Arthritis to restart prednisone decrease in dosage Cough Hycodan expectorant

## 2011-01-30 ENCOUNTER — Other Ambulatory Visit: Payer: Self-pay

## 2011-01-30 MED ORDER — TESTOSTERONE 50 MG/5GM (1%) TD GEL: 50.0000 g | Freq: Every day | TRANSDERMAL | Status: DC

## 2011-01-30 MED ORDER — OMEPRAZOLE 20 MG PO CPDR: 20.0000 mg | DELAYED_RELEASE_CAPSULE | Freq: Every day | ORAL | Status: DC

## 2011-01-30 MED ORDER — TESTOSTERONE 50 MG/5GM (1%) TD GEL: 5.0000 g | Freq: Every day | TRANSDERMAL | Status: DC

## 2011-01-30 NOTE — Telephone Encounter (Signed)
Received a voicemail from pt regarding omperazole, and testim gel; pt says testim gel is really working good from him and would like rx called in to [facility]

## 2011-01-30 NOTE — Telephone Encounter (Signed)
rx called in testim  1% gel qty:  150.0 with [facility] ref[facility] [place]

## 2011-01-30 NOTE — Telephone Encounter (Signed)
Called back and wants Androgel, not Testim.

## 2011-02-12 ENCOUNTER — Ambulatory Visit (INDEPENDENT_AMBULATORY_CARE_PROVIDER_SITE_OTHER): Payer: Medicare Other | Admitting: Family Medicine

## 2011-02-12 ENCOUNTER — Encounter: Payer: Self-pay | Admitting: Family Medicine

## 2011-02-12 VITALS — BP 140/82 | HR 94 | Temp 98.1°F | Wt 197.0 lb

## 2011-02-12 DIAGNOSIS — N19 Unspecified kidney failure: Secondary | ICD-10-CM

## 2011-02-12 DIAGNOSIS — M109 Gout, unspecified: Secondary | ICD-10-CM

## 2011-02-12 DIAGNOSIS — J029 Acute pharyngitis, unspecified: Secondary | ICD-10-CM

## 2011-02-12 DIAGNOSIS — I10 Essential (primary) hypertension: Secondary | ICD-10-CM

## 2011-02-12 DIAGNOSIS — Z23 Encounter for immunization: Secondary | ICD-10-CM

## 2011-02-12 DIAGNOSIS — E559 Vitamin D deficiency, unspecified: Secondary | ICD-10-CM

## 2011-02-12 DIAGNOSIS — H60399 Other infective otitis externa, unspecified ear: Secondary | ICD-10-CM

## 2011-02-12 DIAGNOSIS — H6090 Unspecified otitis externa, unspecified ear: Secondary | ICD-10-CM

## 2011-02-12 DIAGNOSIS — E538 Deficiency of other specified B group vitamins: Secondary | ICD-10-CM

## 2011-02-12 LAB — BASIC METABOLIC PANEL
BUN: 28 mg/dL — ABNORMAL HIGH (ref 6–23)
CO2: 27 mEq/L (ref 19–32)
Chloride: 104 mEq/L (ref 96–112)
Creatinine, Ser: 1.7 mg/dL — ABNORMAL HIGH (ref 0.4–1.5)

## 2011-02-12 MED ORDER — DICYCLOMINE HCL 20 MG PO TABS: 20.0000 mg | ORAL_TABLET | Freq: Three times a day (TID) | ORAL | Status: DC | PRN

## 2011-02-12 MED ORDER — DOXYCYCLINE HYCLATE 100 MG PO TABS: 100.0000 mg | ORAL_TABLET | Freq: Two times a day (BID) | ORAL | Status: DC

## 2011-02-12 MED ORDER — LISINOPRIL 20 MG PO TABS: 20.0000 mg | ORAL_TABLET | Freq: Every day | ORAL | Status: DC

## 2011-02-12 MED ORDER — CYANOCOBALAMIN 1000 MCG/ML IJ SOLN
1000.0000 ug | Freq: Once | INTRAMUSCULAR | Status: AC
  Administered 2011-02-12: 1000 ug via INTRAMUSCULAR

## 2011-02-12 MED ORDER — CLOTRIMAZOLE-BETAMETHASONE 1-0.05 % EX CREA: TOPICAL_CREAM | CUTANEOUS | Status: DC

## 2011-02-12 MED ORDER — TOBRAMYCIN-DEXAMETHASONE 0.3-0.1 % OP SUSP: 1.0000 [drp] | Freq: Three times a day (TID) | OPHTHALMIC | Status: DC

## 2011-02-12 MED ORDER — ALBUTEROL SULFATE HFA 108 (90 BASE) MCG/ACT IN AERS: 2.0000 | INHALATION_SPRAY | Freq: Three times a day (TID) | RESPIRATORY_TRACT | Status: DC

## 2011-02-12 MED ORDER — NEOMYCIN-POLYMYXIN-HC 1 % OT SOLN: OTIC | Status: DC

## 2011-02-12 NOTE — Patient Instructions (Signed)
androgel 1.62 % apply 1 spray to each  Shoulder Ear  Infection cortisporin 4 qtts in ear four times per day Eye infection drops 3 times per day both eyes Dicyclomine for IBS lotrisone for rash twice daily Lisinopril 20 mg daily for blood pressure Pharyngitis ddoxycycline 100 mg twice daily

## 2011-02-19 ENCOUNTER — Telehealth: Payer: Self-pay | Admitting: *Deleted

## 2011-02-19 NOTE — Telephone Encounter (Signed)
Pt. States the Lisinopril is making him dizzy and unsteady on his feet.  His BP was 146/118.  Please call him back with suggestions, please.

## 2011-02-20 NOTE — Telephone Encounter (Signed)
Pt called again. Please return his call. He stated that he is dizzy.

## 2011-02-24 ENCOUNTER — Encounter: Payer: Self-pay | Admitting: Family Medicine

## 2011-02-24 NOTE — Progress Notes (Signed)
  Subjective:    Patient ID: [name], male    DOB: 1946-04-01, 65 y.o.   MRN: [id number] This 65 year old white male is in today requesting that we change  trstim gel to Androgel 1.62%because he does not feelHPI the results as well on the Testim he had androgel in the past Today he complains of earache right ear sore throat also had eye discomfort No cough no fever He has a concern that his blood pressure is elevated at 150/100 times especially at night No urinary symptoms no dysuria no urgency no frequency   Review of Systemssee history of present illness     Objective:   Physical Exam patient is well-developed well-nourished man who's complained of pain but does not appear in any distress blood pressure 140/82HEENT positive findings of right external canal erythematous with drainage pharynx injected with exudate as evident bacterial conjunctivitis Lungs clear to palpation percussion auscultation no rhonchi no rales        Assessment & Plan:  Hypertension to add lisinopril 10 mg q.d. Plus amlodipine External otitis right ear treated with Cortisporin 4 drops in ear q.i.d. Bilateral conjunctivitis TobraDex one drop in each eye t.i.d. Pharyngitis doxycycline 100 mg b.i.d. AndroGel 1.60% one pump on each  shoulder each day

## 2011-02-26 ENCOUNTER — Ambulatory Visit (INDEPENDENT_AMBULATORY_CARE_PROVIDER_SITE_OTHER): Payer: Medicare Other | Admitting: Family Medicine

## 2011-02-26 ENCOUNTER — Encounter: Payer: Self-pay | Admitting: Family Medicine

## 2011-02-26 VITALS — BP 130/72 | HR 79 | Temp 98.8°F | Wt 197.0 lb

## 2011-02-26 DIAGNOSIS — M545 Low back pain, unspecified: Secondary | ICD-10-CM

## 2011-02-26 DIAGNOSIS — IMO0001 Reserved for inherently not codable concepts without codable children: Secondary | ICD-10-CM

## 2011-02-26 DIAGNOSIS — M609 Myositis, unspecified: Secondary | ICD-10-CM

## 2011-02-26 DIAGNOSIS — G8929 Other chronic pain: Secondary | ICD-10-CM

## 2011-02-26 DIAGNOSIS — R42 Dizziness and giddiness: Secondary | ICD-10-CM

## 2011-02-26 MED ORDER — MECLIZINE HCL 25 MG PO TABS: ORAL_TABLET | ORAL | Status: DC

## 2011-02-26 MED ORDER — METHYLPREDNISOLONE ACETATE 80 MG/ML IJ SUSP
120.0000 mg | Freq: Once | INTRAMUSCULAR | Status: AC
  Administered 2011-02-26: 120 mg via INTRAMUSCULAR

## 2011-02-26 NOTE — Telephone Encounter (Signed)
Pt called again

## 2011-02-26 NOTE — Telephone Encounter (Signed)
Spoke with pt and asked if he would come in on 02/26/11

## 2011-02-26 NOTE — Progress Notes (Signed)
  Subjective:    Patient ID: [name], male    DOB: 1946/01/28, 65 y.o.   MRN: [id number] This 65 year old white married male he didn't complain of dizziness over the past week no nausea no respiratory infections his blood pressures been fluctuating somewhat distended within normal limits therefore we have stopped his lisinopril and amlodipine. He feels fine otherwise but continues to have occasional episodes of dizziness his other plan is that of a trigger point in the left upper posterior chest  He continues to chronic back pain and when necessary call pain clinic for epidural injection done by Dr. Ilean China HPI    Review of Systemssee history of present illness no other symptoms on review of systems     Objective:   Physical Exam patient is well-developed well-nourished man who appears to be in no distress who complained of dizziness episodically HEENT no positive findings negative for nystagmus Heart lungs negative Neurological exam negative        Assessment & Plan:  Dizziness related to labyrinthitis at this time to treat with meclizine 25 mg t.i.d. To prevent dizziness Discontinuing hypertensive treatment Continue other medications Trigger point left posterior chest injected with 1.5 cc 1% lidocaine and 120 mg Depo-Medrol

## 2011-02-26 NOTE — Telephone Encounter (Signed)
Pt was seen in the office today.  

## 2011-02-26 NOTE — Patient Instructions (Signed)
Take meclizine 25 mg morn, midafternoon and hs to prevent diziness Discontinue all BP medications,lisinopril and amlodypine Injected trigger zone in left chest

## 2011-03-10 ENCOUNTER — Other Ambulatory Visit: Payer: Self-pay

## 2011-03-10 ENCOUNTER — Other Ambulatory Visit: Payer: PRIVATE HEALTH INSURANCE

## 2011-03-10 MED ORDER — FEXOFENADINE HCL 60 MG PO TABS: 60.0000 mg | ORAL_TABLET | Freq: Every day | ORAL | Status: DC

## 2011-04-08 ENCOUNTER — Other Ambulatory Visit: Payer: Self-pay | Admitting: Family Medicine

## 2011-04-08 ENCOUNTER — Telehealth: Payer: Self-pay

## 2011-04-08 ENCOUNTER — Other Ambulatory Visit (INDEPENDENT_AMBULATORY_CARE_PROVIDER_SITE_OTHER): Payer: Medicare Other | Admitting: Family Medicine

## 2011-04-08 ENCOUNTER — Other Ambulatory Visit: Payer: Medicare Other

## 2011-04-08 DIAGNOSIS — N390 Urinary tract infection, site not specified: Secondary | ICD-10-CM

## 2011-04-08 DIAGNOSIS — N419 Inflammatory disease of prostate, unspecified: Secondary | ICD-10-CM

## 2011-04-08 DIAGNOSIS — N3090 Cystitis, unspecified without hematuria: Secondary | ICD-10-CM

## 2011-04-08 LAB — POCT URINALYSIS DIPSTICK
Bilirubin, UA: NEGATIVE
Glucose, UA: NEGATIVE
Ketones, UA: NEGATIVE
Spec Grav, UA: 1.02
pH, UA: 5

## 2011-04-08 MED ORDER — TRAZODONE HCL 100 MG PO TABS: 100.0000 mg | ORAL_TABLET | Freq: Every day | ORAL | Status: DC

## 2011-04-08 MED ORDER — CIPROFLOXACIN HCL 500 MG PO TABS: 500.0000 mg | ORAL_TABLET | Freq: Two times a day (BID) | ORAL | Status: AC

## 2011-04-08 NOTE — Telephone Encounter (Signed)
Pt called and stated that he thinks he has an infection and needed an appointment.  Pt was called back and a message was left on his voicemail.  Will try to call pt back

## 2011-04-23 ENCOUNTER — Encounter: Payer: Self-pay | Admitting: Family Medicine

## 2011-04-23 ENCOUNTER — Ambulatory Visit (INDEPENDENT_AMBULATORY_CARE_PROVIDER_SITE_OTHER): Payer: Medicare Other | Admitting: Family Medicine

## 2011-04-23 DIAGNOSIS — R5383 Other fatigue: Secondary | ICD-10-CM

## 2011-04-23 DIAGNOSIS — R5381 Other malaise: Secondary | ICD-10-CM

## 2011-04-23 DIAGNOSIS — H8309 Labyrinthitis, unspecified ear: Secondary | ICD-10-CM

## 2011-04-23 DIAGNOSIS — R319 Hematuria, unspecified: Secondary | ICD-10-CM

## 2011-04-23 DIAGNOSIS — J310 Chronic rhinitis: Secondary | ICD-10-CM

## 2011-04-23 LAB — POCT URINALYSIS DIPSTICK
Bilirubin, UA: NEGATIVE
Glucose, UA: NEGATIVE
Ketones, UA: NEGATIVE
Spec Grav, UA: 1.02
Urobilinogen, UA: 0.2

## 2011-04-23 MED ORDER — CYANOCOBALAMIN 1000 MCG/ML IJ SOLN
1000.0000 ug | Freq: Once | INTRAMUSCULAR | Status: AC
  Administered 2011-04-23: 1000 ug via INTRAMUSCULAR

## 2011-04-23 MED ORDER — AMOXICILLIN 500 MG PO CAPS: 500.0000 mg | ORAL_CAPSULE | Freq: Three times a day (TID) | ORAL | Status: AC

## 2011-05-19 ENCOUNTER — Encounter: Payer: Self-pay | Admitting: Family Medicine

## 2011-05-19 NOTE — Progress Notes (Signed)
  Subjective:    Patient ID: [name], male    DOB: [birth year], 65 y.o.   MRN: [id number] This 65 year old white married male is in for follow up visit regarding dizziness for which he was seen 2 weeks ago and he states his dizziness is less or improved he continues to complain of insomnia and we have discussed trying trazodone for sleep in regard to the dizziness he relates he does have some nasal congestion noted since his last visit HPI patient had no headaches no nausea vomiting no chest discomfort no chest pain no shortness of breath no indigestion no change in bowel habits no diarrhea no urinary symptoms the patient request urinalysis which was negative except for 1+ hematuria which he has a past history and history of stones    Review of Systems CHPI     Objective:   Physical Exam the patient is a well-built well-nourished man who appears in no distress has stable gait Vital signs negative with blood pressure 112/70 HEENT reveals nasal mucosa be boggy pale slightly edematous with clear drainage Carotid pulses are good thyroid nonpalpable Heart and lungs reveal no abnormalities at this time Neurological Romberg negative normal gait no nystagmus        Assessment & Plan:  Labyrinthitis continue meclizine also to get Allegra OTC for Allergic rhinitis Hypertension well controlled at 112/70 after discontinuing some of his medications

## 2011-05-19 NOTE — Patient Instructions (Signed)
Diagnosis of labyrinthitis still present and improving continue meclizine also have added Allegra for the allergic rhinitis which hasn't affect on the eustachian tubes and can affect dizziness Urinalysis was negative except for 1+ hematuria but nol problem Return if symptoms do not improve or increase in severity

## 2011-05-28 ENCOUNTER — Other Ambulatory Visit: Payer: Self-pay | Admitting: Family Medicine

## 2011-05-29 ENCOUNTER — Other Ambulatory Visit: Payer: Self-pay | Admitting: Family Medicine

## 2011-05-29 ENCOUNTER — Other Ambulatory Visit: Payer: Self-pay

## 2011-05-29 NOTE — Telephone Encounter (Signed)
Pt c/o elevated BP, leg pain below the knees, insomnia last night, and "something just not right" in his body x 1 1/5 weeks.  Spoke with pt. BP 135/115 at [facility]

## 2011-05-30 ENCOUNTER — Telehealth: Payer: Self-pay | Admitting: Family Medicine

## 2011-05-30 NOTE — Telephone Encounter (Signed)
Dr. Scotty Court requested to call pt himself. Pt has an ov with md next week and would like to see if his sxs are related to his lack of vid d. He will take the med and discuss with md at [facility]

## 2011-05-30 NOTE — Telephone Encounter (Signed)
Pt had question reqarding his Vitamin D. Please contact

## 2011-06-04 ENCOUNTER — Ambulatory Visit (INDEPENDENT_AMBULATORY_CARE_PROVIDER_SITE_OTHER): Payer: Medicare Other | Admitting: Family Medicine

## 2011-06-04 ENCOUNTER — Encounter: Payer: Self-pay | Admitting: Family Medicine

## 2011-06-04 DIAGNOSIS — M1990 Unspecified osteoarthritis, unspecified site: Secondary | ICD-10-CM

## 2011-06-04 DIAGNOSIS — R5381 Other malaise: Secondary | ICD-10-CM

## 2011-06-04 DIAGNOSIS — N200 Calculus of kidney: Secondary | ICD-10-CM

## 2011-06-04 DIAGNOSIS — D649 Anemia, unspecified: Secondary | ICD-10-CM

## 2011-06-04 DIAGNOSIS — M129 Arthropathy, unspecified: Secondary | ICD-10-CM

## 2011-06-04 DIAGNOSIS — R5383 Other fatigue: Secondary | ICD-10-CM

## 2011-06-04 LAB — POCT URINALYSIS DIPSTICK
Glucose, UA: NEGATIVE
Nitrite, UA: NEGATIVE
Protein, UA: NEGATIVE
Urobilinogen, UA: 0.2

## 2011-06-04 LAB — CBC WITH DIFFERENTIAL/PLATELET
Basophils Absolute: 0 10*3/uL (ref 0.0–0.1)
Eosinophils Absolute: 0.1 10*3/uL (ref 0.0–0.7)
Lymphocytes Relative: 22.7 % (ref 12.0–46.0)
MCHC: 33 g/dL (ref 30.0–36.0)
Monocytes Absolute: 0.9 10*3/uL (ref 0.1–1.0)
Neutrophils Relative %: 65.5 % (ref 43.0–77.0)
RDW: 13.9 % (ref 11.5–14.6)

## 2011-06-04 MED ORDER — CYANOCOBALAMIN 1000 MCG/ML IJ SOLN
1000.0000 ug | Freq: Once | INTRAMUSCULAR | Status: AC
  Administered 2011-06-04: 1000 ug via INTRAMUSCULAR

## 2011-06-04 MED ORDER — POTASSIUM CHLORIDE 10 MEQ PO TBCR: EXTENDED_RELEASE_TABLET | ORAL | Status: DC

## 2011-06-05 LAB — BASIC METABOLIC PANEL
BUN: 21 mg/dL (ref 6–23)
CO2: 27 mEq/L (ref 19–32)
Calcium: 9.5 mg/dL (ref 8.4–10.5)
Creatinine, Ser: 1.6 mg/dL — ABNORMAL HIGH (ref 0.4–1.5)
Glucose, Bld: 83 mg/dL (ref 70–99)

## 2011-06-06 ENCOUNTER — Other Ambulatory Visit: Payer: Self-pay | Admitting: Family Medicine

## 2011-06-07 NOTE — Progress Notes (Signed)
Quick Note:  Dr. Scotty Court spoke with pt regarding lab results. ______

## 2011-06-09 ENCOUNTER — Encounter: Payer: Self-pay | Admitting: Family Medicine

## 2011-06-09 NOTE — Patient Instructions (Signed)
I feel you're problem list to you to the passing of the stone from the left kidney into the bladder and for pain from the kidney colic and also from arthritis we'll prescribe oxycodone 10 mg every 4 hours when necessary for pain Injection vitamin B 12 1000 mcg IM for fatigue Call or return if necessary

## 2011-06-09 NOTE — Progress Notes (Signed)
  Subjective:    Patient ID: [name], male    DOB: April 16, 1946, 65 y.o.   MRN: [id number] This 65 year old white married male is complaining of being non-energetic, feels badly, also relates she feels that he is trying to pass a stone from the left kidney which is in the past and he believes it's in the bladder at this time and would feel that we should check a urinalysis. Has some arthritis in and is having pain in his feet and hands needs analgesic medication. He has gout and arthritis of which he takes prednisone 10 mg a day. He has hypergonadism but does not take his AndroGel as prescribed but is the decide to use He has been prescribed up Klor-Con 10 mEq to take 1 3 times a day by urologist and would like a refill He also requested B12 injection for his fatigue and pressure has been normal and 18/78 weight has been constant 195 pounds No other symptoms on systems review HPI    Review of Systems see history of present illness     Objective:   Physical Exam patient is a well-built well-nourished pleasant white male in no distress Heart and lungs reveal no abnormalities at this time  abdominal exam reveals liver spleen kidneys are nonpalpable no tenderness except suggestive tenderness over the left CVA No other positive physical        Assessment & Plan:  Renal calculus with hematuria to treat with analgesic, oxycodone one every 4 hours when necessary pain Unenergetic fatigue on vitamin B 12 1000 mcg  Arthritis continue prednisone 10 mg daily

## 2011-06-17 ENCOUNTER — Ambulatory Visit: Payer: Medicare Other | Admitting: Family Medicine

## 2011-07-10 ENCOUNTER — Other Ambulatory Visit: Payer: Self-pay | Admitting: Family Medicine

## 2011-07-10 NOTE — Telephone Encounter (Signed)
Pt req refill of HYDROcodone-acetaminophen (NORCO) 10-325 MG per [facility] [place] [facility] [address].

## 2011-07-11 MED ORDER — HYDROCODONE-ACETAMINOPHEN 10-325 MG PO TABS: 1.0000 | ORAL_TABLET | ORAL | Status: DC | PRN

## 2011-07-11 NOTE — Telephone Encounter (Signed)
Ok per Dr. Scotty Court to fill hydrocodone-acet [phone]

## 2011-08-11 ENCOUNTER — Telehealth: Payer: Self-pay

## 2011-08-11 NOTE — Telephone Encounter (Signed)
Attempted to return pt's phone call; will call at a later time.

## 2011-08-12 ENCOUNTER — Ambulatory Visit: Payer: Medicare Other | Admitting: Family Medicine

## 2011-08-12 ENCOUNTER — Encounter: Payer: Self-pay | Admitting: Family Medicine

## 2011-08-12 ENCOUNTER — Ambulatory Visit (INDEPENDENT_AMBULATORY_CARE_PROVIDER_SITE_OTHER): Payer: Medicare Other | Admitting: Family Medicine

## 2011-08-12 VITALS — BP 130/76 | HR 107 | Temp 98.7°F | Wt 198.0 lb

## 2011-08-12 DIAGNOSIS — E876 Hypokalemia: Secondary | ICD-10-CM

## 2011-08-12 DIAGNOSIS — Z23 Encounter for immunization: Secondary | ICD-10-CM

## 2011-08-12 DIAGNOSIS — R609 Edema, unspecified: Secondary | ICD-10-CM

## 2011-08-12 DIAGNOSIS — E539 Vitamin B deficiency, unspecified: Secondary | ICD-10-CM

## 2011-08-12 DIAGNOSIS — R300 Dysuria: Secondary | ICD-10-CM

## 2011-08-12 DIAGNOSIS — R600 Localized edema: Secondary | ICD-10-CM

## 2011-08-12 DIAGNOSIS — I10 Essential (primary) hypertension: Secondary | ICD-10-CM

## 2011-08-12 LAB — POCT URINALYSIS DIPSTICK
Bilirubin, UA: NEGATIVE
Nitrite, UA: NEGATIVE
Urobilinogen, UA: 0.2
pH, UA: 5

## 2011-08-12 LAB — BASIC METABOLIC PANEL
BUN: 30 mg/dL — ABNORMAL HIGH (ref 6–23)
Calcium: 9.2 mg/dL (ref 8.4–10.5)
GFR: 45.62 mL/min — ABNORMAL LOW (ref >60.00)
Potassium: 3.5 mEq/L (ref 3.5–5.1)
Sodium: 141 mEq/L (ref 135–145)

## 2011-08-12 MED ORDER — CYANOCOBALAMIN 1000 MCG/ML IJ SOLN
1000.0000 ug | Freq: Once | INTRAMUSCULAR | Status: AC
  Administered 2011-08-12: 1000 ug via INTRAMUSCULAR

## 2011-08-12 MED ORDER — TRIAMTERENE-HCTZ 75-50 MG PO TABS: 1.0000 | ORAL_TABLET | Freq: Every day | ORAL | Status: DC

## 2011-08-12 NOTE — Progress Notes (Signed)
Addended by: Rita Ohara R on: 08/12/2011 05:00 PM   Modules accepted: Orders

## 2011-08-12 NOTE — Progress Notes (Signed)
Addended by: Aniceto Boss A on: 08/12/2011 05:40 PM   Modules accepted: Orders

## 2011-08-12 NOTE — Progress Notes (Signed)
  Subjective:    Patient ID: [name], male    DOB: [birth year], 65 y.o.   MRN: [id number]  HPI Here for follow up on low potassium, HTN, and swelling in the feet. He feels well in general. He is taking 1/2 tab a day of Maxzide.    Review of Systems  Constitutional: Negative.   Respiratory: Negative.   Cardiovascular: Negative.        Objective:   Physical Exam  Constitutional: He appears well-developed and well-nourished.  Neck: No thyromegaly present.  Cardiovascular: Normal rate, regular rhythm, normal heart sounds and intact distal pulses.   Pulmonary/Chest: Effort normal and breath sounds normal.  Musculoskeletal:       1+ edema to ankles  Lymphadenopathy:    He has no cervical adenopathy.          Assessment & Plan:  Increase Maxzide to a full tab every day. Check a BMET.

## 2011-08-13 NOTE — Telephone Encounter (Signed)
Spoke with pt at office visit on 08/12/11.

## 2011-08-15 ENCOUNTER — Ambulatory Visit (INDEPENDENT_AMBULATORY_CARE_PROVIDER_SITE_OTHER): Payer: Medicare Other | Admitting: Internal Medicine

## 2011-08-15 ENCOUNTER — Encounter: Payer: Self-pay | Admitting: Internal Medicine

## 2011-08-15 VITALS — BP 128/74 | HR 98 | Temp 97.4°F | Resp 18 | Ht 70.0 in | Wt 195.2 lb

## 2011-08-15 DIAGNOSIS — J069 Acute upper respiratory infection, unspecified: Secondary | ICD-10-CM

## 2011-08-15 MED ORDER — AMOXICILLIN 500 MG PO CAPS: 1000.0000 mg | ORAL_CAPSULE | Freq: Two times a day (BID) | ORAL | Status: AC

## 2011-08-15 NOTE — Progress Notes (Signed)
  Subjective:    Patient ID: [name], male    DOB: Apr 11, 1946, 65 y.o.   MRN: [id number]  HPI One-week history of mild cough, already getting better, also from to sinus congestion. Occasional wheezing.  PMH-- reviewed   Review of Systems Subjective fever initially, no chills. Denies any itchy eyes or itchy nose. No ear discharge but some ear discomfort on the right side.    Objective:   Physical Exam  Constitutional: He appears well-developed and well-nourished.  HENT:  Head: Normocephalic and atraumatic.       Face symmetric, mild discomfort to palpation on all sinuses. Nose is slightly congested. Throat without discharge or redness.  Cardiovascular: Normal rate, regular rhythm and normal heart sounds.   Pulmonary/Chest: Effort normal and breath sounds normal. No respiratory distress. He has no wheezes. He has no rales.      Assessment & Plan:  URI: See instructions, recommend not to  start antibiotics if he is getting better

## 2011-08-15 NOTE — Patient Instructions (Signed)
Rest, fluids , tylenol For cough, take Mucinex DM twice a day as needed  Hold flonase t emporarily and  take Dymista 2 puffs on each side of the nose daily until samples gone  If no better start amoxicillin (see prescription) Call if no better in few days Call anytime if the symptoms are severe

## 2011-08-18 NOTE — Progress Notes (Signed)
Quick Note:  Left voice message with results. ______ 

## 2011-09-01 ENCOUNTER — Encounter: Payer: Self-pay | Admitting: Internal Medicine

## 2011-09-01 ENCOUNTER — Ambulatory Visit (INDEPENDENT_AMBULATORY_CARE_PROVIDER_SITE_OTHER): Payer: Medicare Other | Admitting: Internal Medicine

## 2011-09-01 DIAGNOSIS — M2550 Pain in unspecified joint: Secondary | ICD-10-CM

## 2011-09-01 DIAGNOSIS — G4700 Insomnia, unspecified: Secondary | ICD-10-CM

## 2011-09-01 DIAGNOSIS — E299 Testicular dysfunction, unspecified: Secondary | ICD-10-CM

## 2011-09-01 DIAGNOSIS — E291 Testicular hypofunction: Secondary | ICD-10-CM

## 2011-09-01 DIAGNOSIS — E559 Vitamin D deficiency, unspecified: Secondary | ICD-10-CM

## 2011-09-01 DIAGNOSIS — Z79899 Other long term (current) drug therapy: Secondary | ICD-10-CM

## 2011-09-01 DIAGNOSIS — N138 Other obstructive and reflux uropathy: Secondary | ICD-10-CM

## 2011-09-01 DIAGNOSIS — N139 Obstructive and reflux uropathy, unspecified: Secondary | ICD-10-CM

## 2011-09-01 DIAGNOSIS — F411 Generalized anxiety disorder: Secondary | ICD-10-CM

## 2011-09-01 DIAGNOSIS — N401 Enlarged prostate with lower urinary tract symptoms: Secondary | ICD-10-CM | POA: Insufficient documentation

## 2011-09-01 MED ORDER — TRIAZOLAM 0.25 MG PO TABS: 0.2500 mg | ORAL_TABLET | Freq: Every evening | ORAL | Status: DC | PRN

## 2011-09-01 MED ORDER — TRIAMCINOLONE ACETONIDE 0.1 % EX OINT: TOPICAL_OINTMENT | Freq: Two times a day (BID) | CUTANEOUS | Status: AC

## 2011-09-01 MED ORDER — FINASTERIDE 5 MG PO TABS: 5.0000 mg | ORAL_TABLET | Freq: Every day | ORAL | Status: DC

## 2011-09-01 NOTE — Assessment & Plan Note (Signed)
Off xanax

## 2011-09-01 NOTE — Assessment & Plan Note (Signed)
Obtain ESR, RF, ANA, uric acid. Discussed potential for addiction and/or tolerance with narcotic use. Recommend stop percocet and use only vicodin. Next refill decrease quantity to #60 to more accurately reflect usage.

## 2011-09-01 NOTE — Progress Notes (Signed)
  Subjective:    Patient ID: [name], male    DOB: [birth year], 65 y.o.   MRN: [id number]  HPI Pt presents to clinic for followup of multiple medical problems. Notes h/o chronic diffuse joint pain taking percocet or vicodin prn. States typically take vicodin 1-2 a day. Notes arthralgias are intermittently inflammatory with joint swelling or redness. Has bony nodules of hands followed by hand surgeon with apparent recommendation for surgical intervention in the future. H/o BPH with nocturia 3-4x/night. Passed another kidney stone since seeing Dr. Scotty Court last. Using androgel but not daily because he feels it may be causing intermittent lower leg/ankle swelling. reveiwed CRI with stable creatinine 1.6. Requests refill of halcion for insomnia and states he stopped xanax entirely.   Past Medical History  Diagnosis Date  . Arthritis   . Asthma   . GERD (gastroesophageal reflux disease)   . Hyperlipidemia   . Urinary incontinence   . BPH (benign prostatic hyperplasia)    Past Surgical History  Procedure Date  . Appendectomy   . Cholecystectomy   . Lumbar laminectomy   . Tonsillectomy     reports that he has never smoked. He has never used smokeless tobacco. He reports that he does not drink alcohol or use illicit drugs. family history is not on file. Allergies  Allergen Reactions  . Caffeine   . Codeine   . Meperidine Hcl   . Pseudoephedrine   . Simvastatin      Review of Systems see hpi     Objective:   Physical Exam  Physical Exam  Nursing note and vitals reviewed. Constitutional: Appears well-developed and well-nourished. No distress.  HENT:  Head: Normocephalic and atraumatic.  Right Ear: External ear normal.  Left Ear: External ear normal.  Eyes: Conjunctivae are normal. No scleral icterus.  Neck: Neck supple. Carotid bruit is not present.  Cardiovascular: Normal rate, regular rhythm and normal heart sounds.  Exam reveals no gallop and no friction rub.   No  murmur heard. Pulmonary/Chest: Effort normal and breath sounds normal. No respiratory distress. He has no wheezes. no rales.  Lymphadenopathy:    He has no cervical adenopathy.  Neurological:Alert.  Skin: Skin is warm and dry. Not diaphoretic.  Psychiatric: Has a normal mood and affect.   DRE: good spinchter tone, soft brown stool in the vault. Heme neg. Prostate diffusely enlarged without nodularity      Assessment & Plan:

## 2011-09-01 NOTE — Assessment & Plan Note (Signed)
Obtain vitamin d level 

## 2011-09-01 NOTE — Assessment & Plan Note (Signed)
Obtain early am testosterone. Not taking androgel consistently due to ??leg swelling.

## 2011-09-01 NOTE — Assessment & Plan Note (Signed)
Rf halcion for prn sparing use. Pt made aware of potential for addiction and/or tolerance.

## 2011-09-01 NOTE — Assessment & Plan Note (Signed)
Enlarged on exam. Obtain psa. Attempt proscar. Already maintained on alpha blocker

## 2011-09-11 ENCOUNTER — Other Ambulatory Visit: Payer: Self-pay | Admitting: Internal Medicine

## 2011-09-11 ENCOUNTER — Telehealth: Payer: Self-pay | Admitting: *Deleted

## 2011-09-11 DIAGNOSIS — R768 Other specified abnormal immunological findings in serum: Secondary | ICD-10-CM

## 2011-09-11 NOTE — Telephone Encounter (Signed)
Call placed to patient at [phone], no answer. A voice message was left for patient to return phone call regarding lab results.

## 2011-09-11 NOTE — Telephone Encounter (Signed)
Patient returned phone call. Best# [phone]

## 2011-09-11 NOTE — Telephone Encounter (Signed)
Referral order placed. ?labs in scanning

## 2011-09-11 NOTE — Telephone Encounter (Signed)
Message copied by Glendell Docker on Thu Sep 11, 2011  8:28 AM ------      Message from: Staci Righter.      Created: Wed Sep 10, 2011  1:20 PM       Reviewed outside [facility] labs      -testosterone little low. Not taking every day. If he could use it at least one more day a week that may be enough      -vit d low. Take otc vit d 2000 units po qd.       -his rheumatoid blood test is high. Not 100% accurate test but with his joint pains it could be rheumatoid. Recommend rheumatology consult

## 2011-09-11 NOTE — Telephone Encounter (Signed)
Patient returned phone call, he was informed per Dr Rodena Medin instructions and has verbalized understanding. He stated that he would like to proceed with the Rheumatology referral

## 2011-09-11 NOTE — Telephone Encounter (Signed)
Pt is also requesting PSA level and would like a copy of all labs mailed to him.

## 2011-09-17 NOTE — Telephone Encounter (Signed)
Call placed to [facility]. Requested copy of labs performed on 09/03/2011.   Copy of lab results received from [facility], and mailed to patients address on file.

## 2011-10-14 DIAGNOSIS — L570 Actinic keratosis: Secondary | ICD-10-CM | POA: Diagnosis not present

## 2011-10-14 DIAGNOSIS — C44529 Squamous cell carcinoma of skin of other part of trunk: Secondary | ICD-10-CM | POA: Diagnosis not present

## 2011-10-14 DIAGNOSIS — L821 Other seborrheic keratosis: Secondary | ICD-10-CM | POA: Diagnosis not present

## 2011-10-16 DIAGNOSIS — M109 Gout, unspecified: Secondary | ICD-10-CM | POA: Diagnosis not present

## 2011-10-16 DIAGNOSIS — D509 Iron deficiency anemia, unspecified: Secondary | ICD-10-CM | POA: Diagnosis not present

## 2011-10-22 DIAGNOSIS — C44519 Basal cell carcinoma of skin of other part of trunk: Secondary | ICD-10-CM | POA: Diagnosis not present

## 2011-10-29 ENCOUNTER — Telehealth: Payer: Self-pay | Admitting: *Deleted

## 2011-10-29 NOTE — Telephone Encounter (Signed)
Fax received from medicare stating Triazolam 0.25 mg is not covered on their formulary. He has been provided with a 30 day supply and unless he obtains a formulary exception from [facility] [employer] [facility].  Prior Authorization [id number].

## 2011-10-29 NOTE — Telephone Encounter (Signed)
Call placed to [facility]-[phone] ([place]) 780.52-Insomnia. Triazolam 0.25 mg one by mouth at bed time.  Need to verify medications that patient has tried and failed in the past prior to medication being approved; and if patient has had a sleep study in the past. Medication is pending approval of response  Case # [id number];  Help- desk [phone]

## 2011-10-29 NOTE — Telephone Encounter (Signed)
Patient called and left voice message requesting a return phone call regarding a refill on medication that his insurance medicare part D will not cover. He stated that his insurance will not cover the medication unless he has approval from the provider.

## 2011-10-29 NOTE — Telephone Encounter (Signed)
Insomnia

## 2011-10-30 NOTE — Telephone Encounter (Signed)
Approval received from [facility] on Triazolam 0.25 mg through 10/05/2012 under medicare part D benefit.   Call placed to patient at [phone], he was informed his insurance approved Rx refill for Triazolam. He was advised to check with pharmacy for refill.

## 2011-11-05 ENCOUNTER — Telehealth: Payer: Self-pay | Admitting: Internal Medicine

## 2011-11-05 MED ORDER — FLUTICASONE PROPIONATE 50 MCG/ACT NA SUSP: 2.0000 | Freq: Every day | NASAL | Status: DC

## 2011-11-05 NOTE — Telephone Encounter (Signed)
Rx refill sent to pharmacy. 

## 2011-11-05 NOTE — Telephone Encounter (Signed)
Refill- fluticasone prop spray. Use 2 sprays in each nostril once daily. Qty 15 gm last fill 5.5.12

## 2011-11-24 DIAGNOSIS — E291 Testicular hypofunction: Secondary | ICD-10-CM | POA: Diagnosis not present

## 2011-11-24 DIAGNOSIS — N200 Calculus of kidney: Secondary | ICD-10-CM | POA: Diagnosis not present

## 2011-11-24 DIAGNOSIS — N401 Enlarged prostate with lower urinary tract symptoms: Secondary | ICD-10-CM | POA: Diagnosis not present

## 2011-12-10 ENCOUNTER — Encounter: Payer: Self-pay | Admitting: Internal Medicine

## 2011-12-10 ENCOUNTER — Ambulatory Visit (INDEPENDENT_AMBULATORY_CARE_PROVIDER_SITE_OTHER): Payer: Medicare Other | Admitting: Internal Medicine

## 2011-12-10 DIAGNOSIS — J329 Chronic sinusitis, unspecified: Secondary | ICD-10-CM

## 2011-12-10 DIAGNOSIS — K589 Irritable bowel syndrome without diarrhea: Secondary | ICD-10-CM | POA: Diagnosis not present

## 2011-12-10 MED ORDER — OMEPRAZOLE 20 MG PO CPDR: 20.0000 mg | DELAYED_RELEASE_CAPSULE | Freq: Every day | ORAL | Status: DC

## 2011-12-10 MED ORDER — DOXYCYCLINE HYCLATE 100 MG PO TABS: 100.0000 mg | ORAL_TABLET | Freq: Two times a day (BID) | ORAL | Status: AC

## 2011-12-10 MED ORDER — PREDNISONE 10 MG PO TABS: 10.0000 mg | ORAL_TABLET | Freq: Every day | ORAL | Status: DC

## 2011-12-10 MED ORDER — HYOSCYAMINE SULFATE 0.125 MG PO TABS: 0.1250 mg | ORAL_TABLET | ORAL | Status: DC | PRN

## 2011-12-10 NOTE — Assessment & Plan Note (Signed)
Change levsin to immediate release prn

## 2011-12-10 NOTE — Assessment & Plan Note (Signed)
Attempt abx. Followup if no improvement or worsening.  

## 2011-12-10 NOTE — Progress Notes (Signed)
  Subjective:    Patient ID: [name], male    DOB: Jan 07, 1946, 66 y.o.   MRN: [id number]  HPI Pt presents to clinic for evaluation of URI sx's. Notes several week h/o head congestion, maxillary pressure and ear pain. Has minimal cough that is np. Denies f/c. Was referred to rheumatology and dx'ed with RA. Uses levsin prn for ?IBS but insurance will not cover extended release.   Past Medical History  Diagnosis Date  . Arthritis   . Asthma   . GERD (gastroesophageal reflux disease)   . Hyperlipidemia   . Urinary incontinence   . BPH (benign prostatic hyperplasia)    Past Surgical History  Procedure Date  . Appendectomy   . Cholecystectomy   . Lumbar laminectomy   . Tonsillectomy     reports that he has never smoked. He has never used smokeless tobacco. He reports that he does not drink alcohol or use illicit drugs. family history is not on file. Allergies  Allergen Reactions  . Caffeine   . Codeine   . Meperidine Hcl   . Pseudoephedrine   . Simvastatin      Review of Systems see hpi    Objective:   Physical Exam  Nursing note and vitals reviewed. Constitutional: He appears well-developed and well-nourished. No distress.  HENT:  Head: Normocephalic and atraumatic.  Right Ear: External ear normal.  Left Ear: External ear normal.  Nose: Right sinus exhibits maxillary sinus tenderness. Left sinus exhibits maxillary sinus tenderness.  Mouth/Throat: Oropharynx is clear and moist. No oropharyngeal exudate.  Eyes: Conjunctivae and EOM are normal. No scleral icterus.  Neck: Neck supple.  Pulmonary/Chest: Effort normal and breath sounds normal. No respiratory distress. He has no wheezes. He has no rales.  Neurological: He is alert.  Skin: He is not diaphoretic.  Psychiatric: He has a normal mood and affect.          Assessment & Plan:

## 2011-12-18 DIAGNOSIS — M2550 Pain in unspecified joint: Secondary | ICD-10-CM | POA: Diagnosis not present

## 2012-01-01 ENCOUNTER — Ambulatory Visit: Payer: Medicare Other | Admitting: Internal Medicine

## 2012-01-07 ENCOUNTER — Telehealth: Payer: Self-pay | Admitting: *Deleted

## 2012-01-07 NOTE — Telephone Encounter (Signed)
Patient called and left voice message requesting a Rx for another antibiotic for a sinus infection.   Call was returned to patient at [phone], no answer. A voice message was left for patient to return phone call with more detailed information for current symptoms.

## 2012-01-08 MED ORDER — AZITHROMYCIN 250 MG PO TABS: ORAL_TABLET | ORAL | Status: AC

## 2012-01-08 NOTE — Telephone Encounter (Signed)
zpak if not allergic and no interactions 

## 2012-01-08 NOTE — Telephone Encounter (Signed)
Call placed to patient at [phone], he was informed of Zpak and stated he does not have any allergies to medication that he is aware. No interactions noted. Rx sent to pharmacy. Patient was advised if there is no improvement after he completes the antibiotic he will need to schedule office visit for evaluation. Patient verbalized understanding and agrees.

## 2012-01-08 NOTE — Telephone Encounter (Signed)
Patient returned phone call and left voice message for a return call to [phone].Sore throat, sinus drainage, eye pain, right ear pain. He would like to know if Dr Rodena Medin would provide him with another round of antibiotics.

## 2012-01-26 DIAGNOSIS — R1084 Generalized abdominal pain: Secondary | ICD-10-CM | POA: Diagnosis not present

## 2012-01-26 DIAGNOSIS — N200 Calculus of kidney: Secondary | ICD-10-CM | POA: Diagnosis not present

## 2012-01-26 DIAGNOSIS — N1330 Unspecified hydronephrosis: Secondary | ICD-10-CM | POA: Diagnosis not present

## 2012-01-26 DIAGNOSIS — N201 Calculus of ureter: Secondary | ICD-10-CM | POA: Diagnosis not present

## 2012-02-04 DIAGNOSIS — M5137 Other intervertebral disc degeneration, lumbosacral region: Secondary | ICD-10-CM | POA: Diagnosis not present

## 2012-02-09 DIAGNOSIS — N201 Calculus of ureter: Secondary | ICD-10-CM | POA: Diagnosis not present

## 2012-02-09 DIAGNOSIS — IMO0001 Reserved for inherently not codable concepts without codable children: Secondary | ICD-10-CM | POA: Diagnosis not present

## 2012-02-09 DIAGNOSIS — N200 Calculus of kidney: Secondary | ICD-10-CM | POA: Diagnosis not present

## 2012-02-12 DIAGNOSIS — I10 Essential (primary) hypertension: Secondary | ICD-10-CM | POA: Diagnosis not present

## 2012-02-13 DIAGNOSIS — M5137 Other intervertebral disc degeneration, lumbosacral region: Secondary | ICD-10-CM | POA: Diagnosis not present

## 2012-02-17 DIAGNOSIS — M109 Gout, unspecified: Secondary | ICD-10-CM | POA: Diagnosis not present

## 2012-02-17 DIAGNOSIS — M069 Rheumatoid arthritis, unspecified: Secondary | ICD-10-CM | POA: Diagnosis not present

## 2012-02-18 DIAGNOSIS — N201 Calculus of ureter: Secondary | ICD-10-CM | POA: Diagnosis not present

## 2012-02-18 DIAGNOSIS — M5137 Other intervertebral disc degeneration, lumbosacral region: Secondary | ICD-10-CM | POA: Diagnosis not present

## 2012-02-25 DIAGNOSIS — M47817 Spondylosis without myelopathy or radiculopathy, lumbosacral region: Secondary | ICD-10-CM | POA: Diagnosis not present

## 2012-03-04 ENCOUNTER — Telehealth: Payer: Self-pay | Admitting: Internal Medicine

## 2012-03-04 MED ORDER — TRIAZOLAM 0.25 MG PO TABS: 0.2500 mg | ORAL_TABLET | Freq: Every evening | ORAL | Status: DC | PRN

## 2012-03-04 NOTE — Telephone Encounter (Signed)
Refill- triazolam 0.25mg 

## 2012-03-04 NOTE — Telephone Encounter (Signed)
Ok #30 rf 2.

## 2012-03-04 NOTE — Telephone Encounter (Signed)
Verbal refill order provided to [facility] at [facility].

## 2012-03-08 ENCOUNTER — Telehealth: Payer: Self-pay | Admitting: Internal Medicine

## 2012-03-08 ENCOUNTER — Encounter: Payer: Self-pay | Admitting: Internal Medicine

## 2012-03-08 ENCOUNTER — Ambulatory Visit (INDEPENDENT_AMBULATORY_CARE_PROVIDER_SITE_OTHER): Payer: Medicare Other | Admitting: Internal Medicine

## 2012-03-08 VITALS — BP 118/78 | HR 80 | Temp 98.0°F | Resp 16 | Wt 200.2 lb

## 2012-03-08 DIAGNOSIS — Z79899 Other long term (current) drug therapy: Secondary | ICD-10-CM

## 2012-03-08 DIAGNOSIS — J329 Chronic sinusitis, unspecified: Secondary | ICD-10-CM | POA: Diagnosis not present

## 2012-03-08 DIAGNOSIS — M2550 Pain in unspecified joint: Secondary | ICD-10-CM | POA: Diagnosis not present

## 2012-03-08 DIAGNOSIS — E559 Vitamin D deficiency, unspecified: Secondary | ICD-10-CM

## 2012-03-08 DIAGNOSIS — B351 Tinea unguium: Secondary | ICD-10-CM | POA: Diagnosis not present

## 2012-03-08 DIAGNOSIS — N429 Disorder of prostate, unspecified: Secondary | ICD-10-CM

## 2012-03-08 DIAGNOSIS — E785 Hyperlipidemia, unspecified: Secondary | ICD-10-CM

## 2012-03-08 DIAGNOSIS — E291 Testicular hypofunction: Secondary | ICD-10-CM

## 2012-03-08 MED ORDER — ITRACONAZOLE 100 MG PO CAPS: 200.0000 mg | ORAL_CAPSULE | Freq: Every day | ORAL | Status: AC

## 2012-03-08 MED ORDER — TRIAMTERENE-HCTZ 75-50 MG PO TABS: ORAL_TABLET | ORAL | Status: DC

## 2012-03-08 MED ORDER — AMOXICILLIN 500 MG PO CAPS: 500.0000 mg | ORAL_CAPSULE | Freq: Two times a day (BID) | ORAL | Status: AC

## 2012-03-08 NOTE — Patient Instructions (Signed)
Please schedule early morning labs prior to your next appointment Testosterone-hypogonadism, chem7-renal insufficiency, cbc, lft, psa-v58.69, vit d-vit d deficiency

## 2012-03-08 NOTE — Progress Notes (Signed)
  Subjective:    Patient ID: [name], male    DOB: [birth year], 66 y.o.   MRN: [id number]  HPI Pt presents to clinic for evaluation of sinus problems. Notes maxillary sinus pain/pressure and nasal congestion. No f/c. Requests medication for finger nail fungus infection-tried lamisil in the past but provided headaches. Has lft's regularly through rheumatology. Requests percocet prescription-reviewed [place] narcotic database and received #120 hydrocodone 5/15.  Past Medical History  Diagnosis Date  . Arthritis   . Asthma   . GERD (gastroesophageal reflux disease)   . Hyperlipidemia   . Urinary incontinence   . BPH (benign prostatic hyperplasia)    Past Surgical History  Procedure Date  . Appendectomy   . Cholecystectomy   . Lumbar laminectomy   . Tonsillectomy     reports that he has never smoked. He has never used smokeless tobacco. He reports that he does not drink alcohol or use illicit drugs. family history is not on file. Allergies  Allergen Reactions  . Caffeine   . Codeine   . Meperidine Hcl   . Pseudoephedrine   . Simvastatin      Review of Systems see hpi     Objective:   Physical Exam  Nursing note and vitals reviewed. Constitutional: He appears well-developed and well-nourished. No distress.  HENT:  Head: Normocephalic and atraumatic.  Right Ear: External ear and ear canal normal. Tympanic membrane is erythematous. Tympanic membrane is not perforated, not retracted and not bulging.  Left Ear: Tympanic membrane, external ear and ear canal normal.  Nose: Right sinus exhibits maxillary sinus tenderness. Right sinus exhibits no frontal sinus tenderness. Left sinus exhibits no maxillary sinus tenderness and no frontal sinus tenderness.  Mouth/Throat: Oropharynx is clear and moist. No oropharyngeal exudate.  Eyes: Conjunctivae are normal.  Neurological: He is alert.  Skin: He is not diaphoretic.  Psychiatric: He has a normal mood and affect.            Assessment & Plan:

## 2012-03-09 NOTE — Telephone Encounter (Signed)
Call placed to patient at [phone], he was advised to obtain the phone number and fax number for the [facility] location in [place] that he would like to have his labs drawn at. Patient verbalized understanding and stated that he will call back tomorrow with the requested information.

## 2012-03-10 NOTE — Telephone Encounter (Signed)
Patient returned phone call and has requested lab be faxed to [facility] in [place] [phone], and phone number [phone].  Lab orders entered for [facility] per patient request.

## 2012-03-16 DIAGNOSIS — M47817 Spondylosis without myelopathy or radiculopathy, lumbosacral region: Secondary | ICD-10-CM | POA: Diagnosis not present

## 2012-03-17 ENCOUNTER — Telehealth: Payer: Self-pay | Admitting: Internal Medicine

## 2012-03-17 NOTE — Telephone Encounter (Signed)
xyzal yes rf one year. lamisil - pt stated gave him ha's and wanted alternative. Have been trying to [place] sporanox

## 2012-03-17 NOTE — Telephone Encounter (Signed)
Pharmacy comments:  Patient would like a prescription for his xyzal and lamisil. Both may have been written by his previous doctor who has retired. He is new to our pharmacy. Please send a new RX if approved.

## 2012-03-18 ENCOUNTER — Telehealth: Payer: Self-pay | Admitting: *Deleted

## 2012-03-18 DIAGNOSIS — Z79899 Other long term (current) drug therapy: Secondary | ICD-10-CM

## 2012-03-18 MED ORDER — LEVOCETIRIZINE DIHYDROCHLORIDE 5 MG PO TABS: 5.0000 mg | ORAL_TABLET | Freq: Every evening | ORAL | Status: DC

## 2012-03-18 MED ORDER — TERBINAFINE HCL 250 MG PO TABS: 250.0000 mg | ORAL_TABLET | Freq: Every day | ORAL | Status: DC

## 2012-03-18 NOTE — Telephone Encounter (Signed)
See below

## 2012-03-18 NOTE — Telephone Encounter (Signed)
lamisil 250mg  po qd #30 rf2 Take total of 12 weeks. Needs lft v58.69 after 6wks of taking

## 2012-03-18 NOTE — Telephone Encounter (Signed)
Call placed to patient at [phone], no answer. A detailed voice message was left informing patient per Dr Rodena Medin instructions. Lab order in place for August 2,2013.

## 2012-03-18 NOTE — Telephone Encounter (Signed)
Call placed to patient at [phone], no answer. A voice message was left for patient to return phone call regarding medication refill. Lamisil gave patient headaches, and Sporanox is not covered by patients insurance. Need to know what patient would like to do.

## 2012-03-18 NOTE — Telephone Encounter (Signed)
Rx for Xyzal sent to pharmacy.

## 2012-03-18 NOTE — Telephone Encounter (Signed)
Patient returned phone call he was informed Sporanox is not covered by his insurance. He is requesting the refill on the Lamisil. He is aware the medication caused headaches. He stated he would like to try again and he will cut the tablets in half.

## 2012-03-21 DIAGNOSIS — B351 Tinea unguium: Secondary | ICD-10-CM | POA: Insufficient documentation

## 2012-03-21 NOTE — Assessment & Plan Note (Signed)
Attempt abx. Followup if no improvement or worsening.  

## 2012-03-21 NOTE — Assessment & Plan Note (Signed)
Has hydocodone available. Discourage use of multiple narcotics or narcotics from multiple providers

## 2012-03-21 NOTE — Assessment & Plan Note (Signed)
Attempt sporanox with regular lft monitoring

## 2012-03-23 DIAGNOSIS — Z01810 Encounter for preprocedural cardiovascular examination: Secondary | ICD-10-CM | POA: Diagnosis not present

## 2012-03-23 DIAGNOSIS — M545 Low back pain, unspecified: Secondary | ICD-10-CM | POA: Diagnosis not present

## 2012-03-23 DIAGNOSIS — Z01818 Encounter for other preprocedural examination: Secondary | ICD-10-CM | POA: Diagnosis not present

## 2012-03-23 DIAGNOSIS — M5126 Other intervertebral disc displacement, lumbar region: Secondary | ICD-10-CM | POA: Diagnosis not present

## 2012-03-30 DIAGNOSIS — M47817 Spondylosis without myelopathy or radiculopathy, lumbosacral region: Secondary | ICD-10-CM | POA: Diagnosis not present

## 2012-04-15 DIAGNOSIS — N429 Disorder of prostate, unspecified: Secondary | ICD-10-CM | POA: Diagnosis not present

## 2012-04-15 DIAGNOSIS — E291 Testicular hypofunction: Secondary | ICD-10-CM | POA: Diagnosis not present

## 2012-04-15 DIAGNOSIS — E559 Vitamin D deficiency, unspecified: Secondary | ICD-10-CM | POA: Diagnosis not present

## 2012-04-15 DIAGNOSIS — E785 Hyperlipidemia, unspecified: Secondary | ICD-10-CM | POA: Diagnosis not present

## 2012-04-15 DIAGNOSIS — Z79899 Other long term (current) drug therapy: Secondary | ICD-10-CM | POA: Diagnosis not present

## 2012-04-15 NOTE — Addendum Note (Signed)
Addended by: Mervin Kung A on: 04/15/2012 01:57 PM   Modules accepted: Orders

## 2012-04-19 DIAGNOSIS — M109 Gout, unspecified: Secondary | ICD-10-CM | POA: Diagnosis not present

## 2012-04-21 ENCOUNTER — Telehealth: Payer: Self-pay | Admitting: *Deleted

## 2012-04-21 DIAGNOSIS — M545 Low back pain, unspecified: Secondary | ICD-10-CM | POA: Diagnosis not present

## 2012-04-21 DIAGNOSIS — M48061 Spinal stenosis, lumbar region without neurogenic claudication: Secondary | ICD-10-CM | POA: Diagnosis not present

## 2012-04-21 DIAGNOSIS — M4310 Spondylolisthesis, site unspecified: Secondary | ICD-10-CM | POA: Diagnosis not present

## 2012-04-21 NOTE — Telephone Encounter (Signed)
Message     Potassium is low. If taking potassium tid then increase to bid.    Vit d low. If taking 2000 units of vit d then increase to 3000 units daily.    Will discuss other labs at visit   Patient informed; understood & agreed. Patient is requesting a Rx for the nerve damage pain that he is experiencing in his foot, reports keeping him up all night. He is being sent for nerve conduction testing on 08.07.13 [has OV with you on 08.05.13]. Patient c/o pain w/numbness radiating from foot to leg & hip. Patient has previously taken hydrocodone-apap 10-325 mg & percocet 10-650 [last px 03.2012] for his arthritis & gout pain but reports that he has taken the last of the percocet now and he is in severe pain; would like Rx to tolerate nerve pain until he can see neurology/SLS Please advise.

## 2012-04-22 NOTE — Telephone Encounter (Signed)
Patient informed to contact Dr. Chyrl Civatte patient to Neurologist] per Vo, Dr. Rodena Medin as he had previously discussed his recommendations on taking narcotic medications and would not be able to prescribe any at this time. Patient requested that his future appointments be canceled, as he stated, "I will not be back in to see him [Dr. Hodgin] again". Informed patient that he was understood and that it would be taken care of.

## 2012-04-22 NOTE — Telephone Encounter (Signed)
i have discussed with him in the past including last visit that i do not recommend taking two different short acting narcotic medications. i know his previous provider did this

## 2012-05-04 ENCOUNTER — Encounter: Payer: Self-pay | Admitting: Internal Medicine

## 2012-05-10 ENCOUNTER — Ambulatory Visit: Payer: Medicare Other | Admitting: Internal Medicine

## 2012-05-13 DIAGNOSIS — R209 Unspecified disturbances of skin sensation: Secondary | ICD-10-CM | POA: Diagnosis not present

## 2012-05-13 DIAGNOSIS — M79609 Pain in unspecified limb: Secondary | ICD-10-CM | POA: Diagnosis not present

## 2012-05-26 DIAGNOSIS — M79609 Pain in unspecified limb: Secondary | ICD-10-CM | POA: Diagnosis not present

## 2012-06-03 ENCOUNTER — Ambulatory Visit (INDEPENDENT_AMBULATORY_CARE_PROVIDER_SITE_OTHER): Payer: Medicare Other | Admitting: *Deleted

## 2012-06-03 DIAGNOSIS — M79609 Pain in unspecified limb: Secondary | ICD-10-CM

## 2012-06-03 DIAGNOSIS — M79606 Pain in leg, unspecified: Secondary | ICD-10-CM

## 2012-06-08 DIAGNOSIS — M47817 Spondylosis without myelopathy or radiculopathy, lumbosacral region: Secondary | ICD-10-CM | POA: Diagnosis not present

## 2012-06-10 DIAGNOSIS — IMO0001 Reserved for inherently not codable concepts without codable children: Secondary | ICD-10-CM | POA: Diagnosis not present

## 2012-06-10 DIAGNOSIS — M545 Low back pain, unspecified: Secondary | ICD-10-CM | POA: Diagnosis not present

## 2012-06-10 DIAGNOSIS — M5137 Other intervertebral disc degeneration, lumbosacral region: Secondary | ICD-10-CM | POA: Diagnosis not present

## 2012-06-10 DIAGNOSIS — M2569 Stiffness of other specified joint, not elsewhere classified: Secondary | ICD-10-CM | POA: Diagnosis not present

## 2012-06-14 DIAGNOSIS — M79609 Pain in unspecified limb: Secondary | ICD-10-CM | POA: Diagnosis not present

## 2012-06-14 DIAGNOSIS — G4700 Insomnia, unspecified: Secondary | ICD-10-CM | POA: Diagnosis not present

## 2012-06-17 DIAGNOSIS — M19049 Primary osteoarthritis, unspecified hand: Secondary | ICD-10-CM | POA: Diagnosis not present

## 2012-06-17 DIAGNOSIS — M24139 Other articular cartilage disorders, unspecified wrist: Secondary | ICD-10-CM | POA: Diagnosis not present

## 2012-06-18 ENCOUNTER — Encounter (HOSPITAL_BASED_OUTPATIENT_CLINIC_OR_DEPARTMENT_OTHER): Payer: Self-pay | Admitting: *Deleted

## 2012-06-18 NOTE — Progress Notes (Signed)
To come in for bmet-ekg-denies cardiac problems-resp problems worse with allergies

## 2012-06-24 NOTE — Progress Notes (Signed)
To come in for bmet-ekg  

## 2012-06-25 ENCOUNTER — Encounter (HOSPITAL_BASED_OUTPATIENT_CLINIC_OR_DEPARTMENT_OTHER): Admission: RE | Admit: 2012-06-25 | Payer: Medicare Other | Source: Ambulatory Visit

## 2012-06-25 ENCOUNTER — Other Ambulatory Visit: Payer: Self-pay | Admitting: Orthopedic Surgery

## 2012-06-25 ENCOUNTER — Other Ambulatory Visit (HOSPITAL_BASED_OUTPATIENT_CLINIC_OR_DEPARTMENT_OTHER): Payer: Medicare Other

## 2012-06-25 ENCOUNTER — Ambulatory Visit (HOSPITAL_BASED_OUTPATIENT_CLINIC_OR_DEPARTMENT_OTHER): Payer: Medicare Other

## 2012-06-25 DIAGNOSIS — M1A00X1 Idiopathic chronic gout, unspecified site, with tophus (tophi): Secondary | ICD-10-CM | POA: Diagnosis not present

## 2012-06-25 DIAGNOSIS — M24439 Recurrent dislocation, unspecified wrist: Secondary | ICD-10-CM | POA: Diagnosis not present

## 2012-06-25 LAB — BASIC METABOLIC PANEL
Calcium: 10.1 mg/dL (ref 8.4–10.5)
GFR calc Af Amer: 53 mL/min — ABNORMAL LOW (ref >90)
GFR calc non Af Amer: 45 mL/min — ABNORMAL LOW (ref >90)
Glucose, Bld: 93 mg/dL (ref 70–99)
Potassium: 3.8 mEq/L (ref 3.5–5.1)
Sodium: 143 mEq/L (ref 135–145)

## 2012-06-25 NOTE — Progress Notes (Signed)
Pt in for labs/EKG. Reviewed EKG with Dr. Jacklynn Bue who compared it to EKG from 3/12. No concern, "Unchanged" - continue with plans for surgery.

## 2012-06-30 ENCOUNTER — Encounter (HOSPITAL_BASED_OUTPATIENT_CLINIC_OR_DEPARTMENT_OTHER): Payer: Self-pay | Admitting: *Deleted

## 2012-06-30 ENCOUNTER — Ambulatory Visit (HOSPITAL_BASED_OUTPATIENT_CLINIC_OR_DEPARTMENT_OTHER)
Admission: RE | Admit: 2012-06-30 | Discharge: 2012-06-30 | Disposition: A | Discharge status: Home / Self Care | Payer: Medicare Other | Source: Ambulatory Visit | Attending: Orthopedic Surgery | Admitting: Orthopedic Surgery

## 2012-06-30 ENCOUNTER — Ambulatory Visit (HOSPITAL_BASED_OUTPATIENT_CLINIC_OR_DEPARTMENT_OTHER): Payer: Medicare Other | Admitting: *Deleted

## 2012-06-30 ENCOUNTER — Encounter (HOSPITAL_BASED_OUTPATIENT_CLINIC_OR_DEPARTMENT_OTHER): Payer: Self-pay | Admitting: Orthopedic Surgery

## 2012-06-30 ENCOUNTER — Encounter (HOSPITAL_BASED_OUTPATIENT_CLINIC_OR_DEPARTMENT_OTHER)
Admission: RE | Disposition: A | Discharge status: Home / Self Care | Payer: Self-pay | Source: Ambulatory Visit | Attending: Orthopedic Surgery

## 2012-06-30 DIAGNOSIS — M24439 Recurrent dislocation, unspecified wrist: Secondary | ICD-10-CM | POA: Insufficient documentation

## 2012-06-30 DIAGNOSIS — M19049 Primary osteoarthritis, unspecified hand: Secondary | ICD-10-CM | POA: Diagnosis not present

## 2012-06-30 DIAGNOSIS — G8918 Other acute postprocedural pain: Secondary | ICD-10-CM | POA: Diagnosis not present

## 2012-06-30 DIAGNOSIS — M25539 Pain in unspecified wrist: Secondary | ICD-10-CM | POA: Diagnosis not present

## 2012-06-30 DIAGNOSIS — M24139 Other articular cartilage disorders, unspecified wrist: Secondary | ICD-10-CM | POA: Diagnosis not present

## 2012-06-30 DIAGNOSIS — M1A00X1 Idiopathic chronic gout, unspecified site, with tophus (tophi): Secondary | ICD-10-CM | POA: Insufficient documentation

## 2012-06-30 DIAGNOSIS — M19039 Primary osteoarthritis, unspecified wrist: Secondary | ICD-10-CM | POA: Diagnosis not present

## 2012-06-30 HISTORY — DX: Irritable bowel syndrome, unspecified: K58.9

## 2012-06-30 HISTORY — DX: Other seasonal allergic rhinitis: J30.2

## 2012-06-30 SURGERY — CARPECTOMY WITH RADIAL STYLOIDECTOMY
Anesthesia: General | Site: Wrist | Laterality: Right | Wound class: Clean

## 2012-06-30 MED ORDER — LACTATED RINGERS IV SOLN
INTRAVENOUS | Status: DC
  Administered 2012-06-30 (×2): via INTRAVENOUS

## 2012-06-30 MED ORDER — CEFAZOLIN SODIUM-DEXTROSE 2-3 GM-% IV SOLR
2.0000 g | INTRAVENOUS | Status: AC
  Administered 2012-06-30: 2 g via INTRAVENOUS

## 2012-06-30 MED ORDER — OXYCODONE-ACETAMINOPHEN 7.5-325 MG PO TABS: 1.0000 | ORAL_TABLET | ORAL | Status: DC | PRN

## 2012-06-30 MED ORDER — ONDANSETRON HCL 4 MG/2ML IJ SOLN
INTRAMUSCULAR | Status: DC | PRN
  Administered 2012-06-30: 4 mg via INTRAVENOUS

## 2012-06-30 MED ORDER — BUPIVACAINE-EPINEPHRINE PF 0.5-1:200000 % IJ SOLN
INTRAMUSCULAR | Status: DC | PRN
  Administered 2012-06-30: 30 mL

## 2012-06-30 MED ORDER — ONDANSETRON HCL 4 MG/2ML IJ SOLN: 4.0000 mg | Freq: Four times a day (QID) | INTRAMUSCULAR | Status: DC | PRN

## 2012-06-30 MED ORDER — CHLORHEXIDINE GLUCONATE 4 % EX LIQD: 60.0000 mL | Freq: Once | CUTANEOUS | Status: DC

## 2012-06-30 MED ORDER — FENTANYL CITRATE 0.05 MG/ML IJ SOLN
50.0000 ug | INTRAMUSCULAR | Status: DC | PRN
  Administered 2012-06-30: 100 ug via INTRAVENOUS

## 2012-06-30 MED ORDER — DEXAMETHASONE SODIUM PHOSPHATE 10 MG/ML IJ SOLN
INTRAMUSCULAR | Status: DC | PRN
  Administered 2012-06-30: 10 mg via INTRAVENOUS

## 2012-06-30 MED ORDER — MIDAZOLAM HCL 2 MG/2ML IJ SOLN
1.0000 mg | INTRAMUSCULAR | Status: DC | PRN
  Administered 2012-06-30: 2 mg via INTRAVENOUS

## 2012-06-30 MED ORDER — LIDOCAINE HCL (CARDIAC) 20 MG/ML IV SOLN
INTRAVENOUS | Status: DC | PRN
  Administered 2012-06-30: 75 mg via INTRAVENOUS

## 2012-06-30 MED ORDER — PROPOFOL 10 MG/ML IV BOLUS
INTRAVENOUS | Status: DC | PRN
  Administered 2012-06-30: 200 mg via INTRAVENOUS

## 2012-06-30 MED ORDER — FENTANYL CITRATE 0.05 MG/ML IJ SOLN: 25.0000 ug | INTRAMUSCULAR | Status: DC | PRN

## 2012-06-30 SURGICAL SUPPLY — 55 items
BANDAGE GAUZE ELAST BULKY 4 IN (GAUZE/BANDAGES/DRESSINGS) ×2 IMPLANT
BLADE ARTHRO LOK 4 BEAVER (BLADE) ×2 IMPLANT
BLADE EAR TYMPAN 2.5 60D BEAV (BLADE) IMPLANT
BLADE MINI RND TIP GREEN BEAV (BLADE) ×2 IMPLANT
BLADE SURG 15 STRL LF DISP TIS (BLADE) ×1 IMPLANT
BLADE SURG 15 STRL SS (BLADE) ×2
BNDG CMPR 9X4 STRL LF SNTH (GAUZE/BANDAGES/DRESSINGS) ×1
BNDG COHESIVE 3X5 TAN STRL LF (GAUZE/BANDAGES/DRESSINGS) ×2 IMPLANT
BNDG ESMARK 4X9 LF (GAUZE/BANDAGES/DRESSINGS) ×2 IMPLANT
CHLORAPREP W/TINT 26ML (MISCELLANEOUS) ×2 IMPLANT
CLOTH BEACON ORANGE TIMEOUT ST (SAFETY) ×2 IMPLANT
CORDS BIPOLAR (ELECTRODE) ×2 IMPLANT
COVER MAYO STAND STRL (DRAPES) ×2 IMPLANT
COVER TABLE BACK 60X90 (DRAPES) ×2 IMPLANT
CUFF TOURNIQUET SINGLE 18IN (TOURNIQUET CUFF) ×1 IMPLANT
DECANTER SPIKE VIAL GLASS SM (MISCELLANEOUS) IMPLANT
DRAPE EXTREMITY T 121X128X90 (DRAPE) ×2 IMPLANT
DRAPE OEC MINIVIEW 54X84 (DRAPES) ×2 IMPLANT
DRAPE SURG 17X23 STRL (DRAPES) ×2 IMPLANT
GAUZE XEROFORM 1X8 LF (GAUZE/BANDAGES/DRESSINGS) ×2 IMPLANT
GLOVE BIO SURGEON STRL SZ 6.5 (GLOVE) ×2 IMPLANT
GLOVE BIOGEL PI IND STRL 7.0 (GLOVE) IMPLANT
GLOVE BIOGEL PI IND STRL 8.5 (GLOVE) ×1 IMPLANT
GLOVE BIOGEL PI INDICATOR 7.0 (GLOVE) ×1
GLOVE BIOGEL PI INDICATOR 8.5 (GLOVE) ×1
GLOVE SURG ORTHO 8.0 STRL STRW (GLOVE) ×2 IMPLANT
GLOVE SURG SS PI 8.5 STRL IVOR (GLOVE) ×1
GLOVE SURG SS PI 8.5 STRL STRW (GLOVE) ×1 IMPLANT
GOWN BRE IMP PREV XXLGXLNG (GOWN DISPOSABLE) ×2 IMPLANT
GOWN PREVENTION PLUS XLARGE (GOWN DISPOSABLE) ×2 IMPLANT
NS IRRIG 1000ML POUR BTL (IV SOLUTION) ×2 IMPLANT
PACK BASIN DAY SURGERY FS (CUSTOM PROCEDURE TRAY) ×2 IMPLANT
PAD CAST 3X4 CTTN HI CHSV (CAST SUPPLIES) ×1 IMPLANT
PADDING CAST ABS 3INX4YD NS (CAST SUPPLIES)
PADDING CAST ABS 4INX4YD NS (CAST SUPPLIES) ×1
PADDING CAST ABS COTTON 3X4 (CAST SUPPLIES) IMPLANT
PADDING CAST ABS COTTON 4X4 ST (CAST SUPPLIES) ×1 IMPLANT
PADDING CAST COTTON 3X4 STRL (CAST SUPPLIES) ×2
SLEEVE SCD COMPRESS KNEE MED (MISCELLANEOUS) ×2 IMPLANT
SPLINT PLASTER CAST XFAST 3X15 (CAST SUPPLIES) IMPLANT
SPLINT PLASTER XTRA FASTSET 3X (CAST SUPPLIES) ×10
SPONGE GAUZE 4X4 12PLY (GAUZE/BANDAGES/DRESSINGS) ×2 IMPLANT
STOCKINETTE 4X48 STRL (DRAPES) ×2 IMPLANT
SUT MERSILENE 3 0 FS 1 (SUTURE) ×2 IMPLANT
SUT VIC AB 0 CT1 27 (SUTURE)
SUT VIC AB 0 CT1 27XBRD ANBCTR (SUTURE) IMPLANT
SUT VIC AB 2-0 SH 27 (SUTURE) ×2
SUT VIC AB 2-0 SH 27XBRD (SUTURE) IMPLANT
SUT VICRYL 4-0 PS2 18IN ABS (SUTURE) ×2 IMPLANT
SUT VICRYL RAPIDE 4/0 PS 2 (SUTURE) ×2 IMPLANT
SYR BULB 3OZ (MISCELLANEOUS) ×2 IMPLANT
SYR CONTROL 10ML LL (SYRINGE) IMPLANT
TOWEL OR 17X24 6PK STRL BLUE (TOWEL DISPOSABLE) ×2 IMPLANT
UNDERPAD 30X30 INCONTINENT (UNDERPADS AND DIAPERS) ×2 IMPLANT
WATER STERILE IRR 1000ML POUR (IV SOLUTION) ×2 IMPLANT

## 2012-06-30 NOTE — Op Note (Signed)
Dictated number: [id number]

## 2012-06-30 NOTE — Anesthesia Procedure Notes (Addendum)
Anesthesia Regional Block:  Supraclavicular block  Pre-Anesthetic Checklist: ,, timeout performed, Correct Patient, Correct Site, Correct Laterality, Correct Procedure, Correct Position, site marked, Risks and benefits discussed,  Surgical consent,  Pre-op evaluation,  At surgeon's request and post-op pain management  Laterality: Right  Prep: chloraprep       Needles:  Injection technique: Single-shot  Needle Type: Echogenic Stimulator Needle     Needle Length: 5cm 5 cm Needle Gauge: 22 and 22 G    Additional Needles:  Procedures: ultrasound guided and nerve stimulator Supraclavicular block  Nerve Stimulator or Paresthesia:  Response: biceps flexion, 0.45 mA,   Additional Responses:   Narrative:  Start time: 06/30/2012 10:38 AM End time: 06/30/2012 10:50 AM Injection made incrementally with aspirations every 5 mL.  Performed by: Personally  Anesthesiologist: Dr Chaney Malling  Additional Notes: Functioning IV was confirmed and monitors were applied.  A 50mm 22ga Arrow echogenic stimulator needle was used. Sterile prep and drape,hand hygiene and sterile gloves were used.  Negative aspiration and negative test dose prior to incremental administration of local anesthetic. The patient tolerated the procedure well.  Ultrasound guidance: relevent anatomy identified, needle position confirmed, local anesthetic spread visualized around nerve(s), vascular puncture avoided.  Image printed for medical record.   Supraclavicular block Procedure Name: LMA Insertion Date/Time: 06/30/2012 12:11 PM Performed by: Suann Larry WOLFE Pre-anesthesia Checklist: Patient identified, Emergency Drugs available, Suction available and Patient being monitored Patient Re-evaluated:Patient Re-evaluated prior to inductionOxygen Delivery Method: Circle System Utilized Preoxygenation: Pre-oxygenation with 100% oxygen Intubation Type: IV induction Ventilation: Mask ventilation without difficulty LMA: LMA  inserted LMA Size: 5.0 Number of attempts: 1 Airway Equipment and Method: bite block Placement Confirmation: positive ETCO2 and breath sounds checked- equal and bilateral Tube secured with: Tape Dental Injury: Teeth and Oropharynx as per pre-operative assessment

## 2012-06-30 NOTE — H&P (Signed)
[name] is a 66 year old right hand dominant male referred by Dr. Marina Gravel for a consultation with respect to pain and swelling in his right wrist. He states this has been going on for the past 3-4 weeks. He recalls no history of injury. No history of diabetes, thyroid problems, or gout. He does have a history of arthritis. He states he was working in his garden and his hand swelled up. He has developed a mass over the dorsal radial aspect. He states that both hands were approximately the same with obvious changes at the basilar area of his thumb, however he is not complaining of significant discomfort. He states he had a short period of numbness but this has resolved but the swelling has not. Activity and work makes this worse. He is presently taking Prednisone. He has a history of kidney stones.   Past Medical History: He is allergic to Codeine. He is on Doxazosin, Omeprazole, Allopurinol, Hydrocodone, Prednisone, Triamterene HCTZ, and aspirin. He has had low back surgery, appendectomy, left shoulder surgery, and cholecystectomy.  Family Medical History: Positive for heart disease, high BP and arthritis.   Social History: He does not smoke or drink. He is married.   Review of Systems: Positive for glasses, asthma and pneumonia, kidney disease, sleep disorder, otherwise negative. [name] is an 66 y.o. male.   Chief Complaint: Slac wrist rt HPI: see above  Past Medical History  Diagnosis Date  . Arthritis   . Asthma   . GERD (gastroesophageal reflux disease)   . Hyperlipidemia   . Urinary incontinence   . BPH (benign prostatic hyperplasia)   . Seasonal allergies   . IBS (irritable bowel syndrome)   . Swelling of both ankles     on diuretic    Past Surgical History  Procedure Date  . Appendectomy   . Cholecystectomy   . Lumbar laminectomy   . Tonsillectomy   . Hernia repair 2011    umb    History reviewed. No pertinent family history. Social History:  reports  that he has never smoked. He has never used smokeless tobacco. He reports that he does not drink alcohol or use illicit drugs.  Allergies:  Allergies  Allergen Reactions  . Caffeine     Urinary retention  . Codeine     "feel jittery"  . Meperidine Hcl   . Pseudoephedrine     Urinary retention  . Simvastatin     Medications Prior to Admission  Medication Sig Dispense Refill  . azelastine (OPTIVAR) 0.05 % ophthalmic solution Place 2 drops into both eyes 2 (two) times daily.        . Cholecalciferol (VITAMIN D) 2000 UNITS CAPS Take 2,000 Units by mouth daily.        Marland Kitchen doxazosin (CARDURA) 2 MG tablet TAKE ONE TABLET BY MOUTH EVERY DAY  90 tablet  5  . fluticasone (FLONASE) 50 MCG/ACT nasal spray Place 2 sprays into the nose daily.  16 g  5  . levocetirizine (XYZAL) 5 MG tablet Take 1 tablet (5 mg total) by mouth every evening.  90 tablet  4  . omeprazole (PRILOSEC) 20 MG capsule Take 1 capsule (20 mg total) by mouth daily.  90 capsule  3  . oxyCODONE-acetaminophen (PERCOCET/ROXICET) 5-325 MG per tablet Take 1 tablet by mouth every 4 (four) hours as needed.      . senna (SENOKOT) 8.6 MG tablet Take 2 tablets by mouth at bedtime as needed. 2-4 tablets q hs prn  for constipation       . terbinafine (LAMISIL) 250 MG tablet Take 1 tablet (250 mg total) by mouth daily.  30 tablet  2  . testosterone (ANDROGEL) 50 MG/5GM GEL Place 5 g onto the skin. Use three days per week.       . triamcinolone (KENALOG) 0.025 % cream Apply 1 application topically 2 (two) times daily.      Marland Kitchen triamcinolone ointment (KENALOG) 0.1 % Apply topically 2 (two) times daily.  60 g  1  . triamterene-hydrochlorothiazide (MAXZIDE) 75-50 MG per tablet Take 1/2 tablet by mouth once a day  30 tablet  6  . triazolam (HALCION) 0.25 MG tablet Take 1 tablet (0.25 mg total) by mouth at bedtime as needed.  30 tablet  2  . albuterol (PROAIR HFA) 108 (90 BASE) MCG/ACT inhaler Inhale 2 puffs into the lungs 3 (three) times daily. As  needed for wheezing  54 g  3  . dicyclomine (BENTYL) 20 MG tablet Take 1 tablet (20 mg total) by mouth 3 (three) times daily as needed. For irritable bowel  90 tablet  11  . hyoscyamine (LEVSIN, ANASPAZ) 0.125 MG tablet Take 1 tablet (0.125 mg total) by mouth every 4 (four) hours as needed for cramping.  30 tablet  4  . meclizine (ANTIVERT) 25 MG tablet 1 morn midaternoon and hs to prevent dizziness  90 tablet  11  . potassium citrate (UROCIT-K) 10 MEQ (1080 MG) SR tablet Take 10 mEq by mouth 3 (three) times daily with meals.        Results for orders placed during the hospital encounter of 06/30/12 (from the past 48 hour(s))  POCT HEMOGLOBIN-HEMACUE     Status: Normal   Collection Time   06/30/12 10:08 AM      Component Value Range Comment   Hemoglobin 16.9  13.0 - 17.0 g/dL     No results found.   Pertinent items are noted in HPI.  Blood pressure 117/79, pulse 94, temperature 98.4 F (36.9 C), temperature source Oral, resp. rate 18, height 5\' 11"  (1.803 m), weight 90.719 kg (200 lb), SpO2 96.00%.  General appearance: alert, cooperative and appears stated age Head: Normocephalic, without obvious abnormality Neck: no adenopathy Resp: clear to auscultation bilaterally Cardio: regular rate and rhythm, S1, S2 normal, no murmur, click, rub or gallop GI: soft, non-tender; bowel sounds normal; no masses,  no organomegaly Extremities: edema rt wrist Pulses: 2+ and symmetric Skin: Skin color, texture, turgor normal. No rashes or lesions Neurologic: Grossly normal Incision/Wound: na  Assessment/Plan X-rays reveal the SLAC wrist.   He desires proceeding to have this taken care of. We would recommend treatment only of the SLAC wrist and not the [facility] joint arthritis. We would recommend a proximal row carpectomy if necessary interposition arthroplasty with a radial styloidectomy. The pre, peri and post op course are discussed along with risks and complications. He is aware there is no  guarantee with surgery, possibility of infection, recurrence, injury to arteries, nerves and tendons, incomplete relief of symptoms and dystrophy.  We have discussed the possibility of a complete wrist fusion, ulnar four bone fusion and he would like to proceed with proximal row carpectomy with possible interposition if necessary.  Ruthy Forry R 06/30/2012, 10:21 AM

## 2012-06-30 NOTE — Transfer of Care (Signed)
Immediate Anesthesia Transfer of Care Note  Patient: [name]  Procedure(s) Performed: Procedure(s) (LRB) with comments: CARPECTOMY WITH RADIAL STYLOIDECTOMY (Right) - proximal row carpectomy right wrist, possible interposition graft, possible radial styloidectomy  Patient Location: PACU  Anesthesia Type: GA combined with regional for post-op pain  Level of Consciousness: awake, alert  and oriented  Airway & Oxygen Therapy: Patient Spontanous Breathing and Patient connected to face mask oxygen  Post-op Assessment: Report given to PACU RN, Post -op Vital signs reviewed and stable and Patient moving all extremities  Post vital signs: Reviewed and stable  Complications: No apparent anesthesia complications

## 2012-06-30 NOTE — Discharge Instructions (Addendum)
[facility] Center Instructions °Hand Surgery ° °Wound Care: °Keep your hand elevated above the level of your heart.  Do not allow it to dangle by your side.  Keep the dressing dry and do not remove it unless your doctor advises you to do so.  He will usually change it at the time of your post-op visit.  Moving your fingers is advised to stimulate circulation but will depend on the site of your surgery.  If you have a splint applied, your doctor will advise you regarding movement. ° °Activity: °Do not drive or operate machinery today.  Rest today and then you may return to your normal activity and work as indicated by your physician. ° °Diet:  °Drink liquids today or eat a light diet.  You may resume a regular diet tomorrow.   ° °General expectations: °Pain for two to three days. °Fingers may become slightly swollen. ° °Call your doctor if any of the following occur: °Severe pain not relieved by pain medication. °Elevated temperature. °Dressing soaked with blood. °Inability to move fingers. °White or bluish color to fingers. ° ° °Post Anesthesia Home Care Instructions ° °Activity: °Get plenty of rest for the remainder of the day. A responsible adult should stay with you for 24 hours following the procedure.  °For the next 24 hours, DO NOT: °-Drive a car °-Operate machinery °-Drink alcoholic beverages °-Take any medication unless instructed by your physician °-Make any legal decisions or sign important papers. ° °Meals: °Start with liquid foods such as gelatin or soup. Progress to regular foods as tolerated. Avoid greasy, spicy, heavy foods. If nausea and/or vomiting occur, drink only clear liquids until the nausea and/or vomiting subsides. Call your physician if vomiting continues. ° °Special Instructions/Symptoms: °Your throat may feel dry or sore from the anesthesia or the breathing tube placed in your throat during surgery. If this causes discomfort, gargle with warm salt water. The discomfort should disappear within 24  hours. ° ° °Regional Anesthesia Blocks ° °1. Numbness or the inability to move the "blocked" extremity may last from 3-48 hours after placement. The length of time depends on the medication injected and your individual response to the medication. If the numbness is not going away after 48 hours, call your surgeon. ° °2. The extremity that is blocked will need to be protected until the numbness is gone and the  Strength has returned. Because you cannot feel it, you will need to take extra care to avoid injury. Because it may be weak, you may have difficulty moving it or using it. You may not know what position it is in without looking at it while the block is in effect. ° °3. For blocks in the legs and feet, returning to weight bearing and walking needs to be done carefully. You will need to wait until the numbness is entirely gone and the strength has returned. You should be able to move your leg and foot normally before you try and bear weight or walk. You will need someone to be with you when you first try to ensure you do not fall and possibly risk injury. ° °4. Bruising and tenderness at the needle site are common side effects and will resolve in a few days. ° °5. Persistent numbness or new problems with movement should be communicated to the surgeon or the [facility] ([phone])/ [facility] ([phone]). °

## 2012-06-30 NOTE — Brief Op Note (Signed)
06/30/2012  1:22 PM  PATIENT:  [name]  66 y.o. male  PRE-OPERATIVE DIAGNOSIS:  slac right wrist  POST-OPERATIVE DIAGNOSIS:  Slac Right Wrist  PROCEDURE:  Procedure(s) (LRB) with comments: CARPECTOMY WITH RADIAL STYLOIDECTOMY (Right) - proximal row carpectomy right wrist, possible interposition graft, possible radial styloidectomy  SURGEON:  Surgeon(s) and Role:    * Nicki Reaper, MD - Primary  PHYSICIAN ASSISTANT:   ASSISTANTS: none   ANESTHESIA:   regional and general  EBL:  Total I/O In: 350 [I.V.:350] Out: -   BLOOD ADMINISTERED:none  DRAINS: none   LOCAL MEDICATIONS USED:  NONE  SPECIMEN:  No Specimen  DISPOSITION OF SPECIMEN:  N/A  COUNTS:  YES  TOURNIQUET:   Total Tourniquet Time Documented: Upper Arm (Right) - 47 minutes  DICTATION: .Other Dictation: Dictation Number [id number]  PLAN OF CARE: Discharge to home after PACU  PATIENT DISPOSITION:  PACU - hemodynamically stable.

## 2012-06-30 NOTE — Anesthesia Postprocedure Evaluation (Signed)
Anesthesia Post Note  Patient: [name]  Procedure(s) Performed: Procedure(s) (LRB): CARPECTOMY WITH RADIAL STYLOIDECTOMY (Right)  Anesthesia type: General  Patient location: PACU  Post pain: Pain level controlled and Adequate analgesia  Post assessment: Post-op Vital signs reviewed, Patient's Cardiovascular Status Stable, Respiratory Function Stable, Patent Airway and Pain level controlled  Last Vitals:  Filed Vitals:   06/30/12 1319  BP:   Pulse:   Temp: 36.7 C  Resp:     Post vital signs: Reviewed and stable  Level of consciousness: awake, alert  and oriented  Complications: No apparent anesthesia complications

## 2012-06-30 NOTE — Anesthesia Preprocedure Evaluation (Addendum)
Anesthesia Evaluation  Patient identified by MRN, date of birth, ID band Patient awake    Reviewed: Allergy & Precautions, H&P , NPO status , Patient's Chart, lab work & pertinent test results  Airway Mallampati: II  Neck ROM: full    Dental   Pulmonary asthma ,          Cardiovascular     Neuro/Psych Anxiety  Neuromuscular disease    GI/Hepatic GERD-  ,  Endo/Other  Hypothyroidism   Renal/GU      Musculoskeletal  (+) Arthritis -,   Abdominal   Peds  Hematology   Anesthesia Other Findings   Reproductive/Obstetrics                           Anesthesia Physical Anesthesia Plan  ASA: II  Anesthesia Plan: Regional and General   Post-op Pain Management: MAC Combined w/ Regional for Post-op pain   Induction: Intravenous  Airway Management Planned: LMA  Additional Equipment:   Intra-op Plan:   Post-operative Plan:   Informed Consent: I have reviewed the patients History and Physical, chart, labs and discussed the procedure including the risks, benefits and alternatives for the proposed anesthesia with the patient or authorized representative who has indicated his/her understanding and acceptance.     Plan Discussed with: CRNA and Surgeon  Anesthesia Plan Comments:         Anesthesia Quick Evaluation

## 2012-06-30 NOTE — Progress Notes (Signed)
Assisted Dr. Hodierne with right, ultrasound guided, supraclavicular block. Side rails up, monitors on throughout procedure. See vital signs in flow sheet. Tolerated Procedure well.  

## 2012-07-01 NOTE — Op Note (Signed)
[name], [name]              ACCOUNT NO.:  [id number]  MEDICAL RECORD NO.:  [id number]  LOCATION:                                 FACILITY:  PHYSICIAN:  Cindee Salt, M.D.            DATE OF BIRTH:  DATE OF PROCEDURE:  06/30/2012 DATE OF DISCHARGE:                              OPERATIVE REPORT   PREOPERATIVE DIAGNOSIS:  Scapholunate advanced collapse wrist with gout, right wrist.  POSTOPERATIVE DIAGNOSIS:  Scapholunate advanced collapse wrist with gout, right wrist.  OPERATION:  Proximal row carpectomy with capsular interposition, radial styloidectomy, excision of posterior interosseous nerve termination, right wrist.  SURGEON:  Cindee Salt, MD  ANESTHESIA:  Supraclavicular block general.  ANESTHESIOLOGIST:  Achille Rich, MD  HISTORY:  The patient is a 66 year old male with a history of painful right wrist.  He has got a SLAC wrist with significant degenerative changes in the radioscaphoid facet.  He is desirous of maintaining mobility.  He is admitted now for proximal row carpectomy, possible interposition arthroplasty with radial styloidectomy.  He is aware of risks and complications including infection; recurrence of injury to arteries, nerves, tendons; incomplete relief of symptoms; dystrophy.  In the preoperative area, the patient is seen, the extremity marked by both the patient and surgeon, and antibiotic given.  PROCEDURE:  The patient was brought to the operating room where a supraclavicular block was carried out without difficulty.  He was prepped using ChloraPrep, supine position with the right arm free.  A 3- minute dry time was allowed.  General anesthetic was given.  The limb was exsanguinated with an Esmarch bandage.  Tourniquet placed high on the arm was inflated to 250 mmHg.  A longitudinal incision was made at the dorsal aspect of his right wrist, carried down through the subcutaneous tissue.  Bleeders were electrocauterized with  bipolar. Neurovascular structures identified and protected.  Dissection was carried down through the extensor retinaculum on the radial aspect just distal to Lister's tubercle.  The EPL was released.  The posterior interosseous nerve was resected.  The capsule was then opened on its most radial aspect to the radial side of the capitate.  Significant degenerative changes in the radioscaphoid was present along with obvious tophi.  Significant degenerative changes were present over the entire carpus.  There was cartilage left on the proximal aspect of the capitate and it was decided to proceed with a proximal row carpectomy with interposition.  The scaphoid was then resected in a piecemeal fashion using rongeurs, curette protecting the radioscaphocapitate ligament. The triquetrum was then removed next with blunt and sharp dissection, and this was removed without difficulty protecting the volar ligaments. Space point was immediately apparent.  Distally, the lunate was then removed with blunt and sharp dissection again protecting the volar ligaments.  The capsular interposition was then performed using an incision to the ulnar aspect, this allowed the volar capsule to be sutured to the dorsal capsule while rotating the dorsal capsule volarly around the head of the capitate.  The capsule was then closed after irrigation with figure-of-eight 2-0 Vicryl sutures.  The repair was done again with 2-0 Vicryl.  The x-rays were then  taken in AP and lateral direction revealing the capitate lied in the lunate fossa.  The remainder of the dorsal capsule was repaired with figure-of-eight 2-0 Vicryl sutures, the retinaculum with 4-0 Vicryl, the subcutaneous tissue with interrupted 4-0 Vicryl and the skin with interrupted 4-0 Vicryl Rapide.  Sterile compressive dressing, thumb spica splint dorsal, palmar in nature was applied.  X-rays in the AP and lateral direction revealed the capitate lied in the lunate  fossa.  The patient tolerated the procedure well, was taken to the recovery room for observation.  He will be discharged to home to return in 1 week, on Percocet.          ______________________________ Cindee Salt, M.D.     GK/MEDQ  D:  06/30/2012  T:  07/01/2012  Job:  [id number]

## 2012-07-06 DIAGNOSIS — M2410 Other articular cartilage disorders, unspecified site: Secondary | ICD-10-CM | POA: Diagnosis not present

## 2012-07-06 DIAGNOSIS — M24139 Other articular cartilage disorders, unspecified wrist: Secondary | ICD-10-CM | POA: Diagnosis not present

## 2012-07-09 DIAGNOSIS — M109 Gout, unspecified: Secondary | ICD-10-CM | POA: Diagnosis not present

## 2012-07-09 DIAGNOSIS — M79609 Pain in unspecified limb: Secondary | ICD-10-CM | POA: Diagnosis not present

## 2012-07-09 DIAGNOSIS — G4700 Insomnia, unspecified: Secondary | ICD-10-CM | POA: Diagnosis not present

## 2012-07-15 DIAGNOSIS — Z23 Encounter for immunization: Secondary | ICD-10-CM | POA: Diagnosis not present

## 2012-07-20 DIAGNOSIS — M109 Gout, unspecified: Secondary | ICD-10-CM | POA: Diagnosis not present

## 2012-07-23 DIAGNOSIS — M19049 Primary osteoarthritis, unspecified hand: Secondary | ICD-10-CM | POA: Diagnosis not present

## 2012-07-23 DIAGNOSIS — M24139 Other articular cartilage disorders, unspecified wrist: Secondary | ICD-10-CM | POA: Diagnosis not present

## 2012-08-04 DIAGNOSIS — M069 Rheumatoid arthritis, unspecified: Secondary | ICD-10-CM | POA: Diagnosis not present

## 2012-08-04 DIAGNOSIS — M109 Gout, unspecified: Secondary | ICD-10-CM | POA: Diagnosis not present

## 2012-08-06 DIAGNOSIS — N200 Calculus of kidney: Secondary | ICD-10-CM | POA: Diagnosis not present

## 2012-08-11 DIAGNOSIS — M109 Gout, unspecified: Secondary | ICD-10-CM | POA: Diagnosis not present

## 2012-08-11 DIAGNOSIS — M069 Rheumatoid arthritis, unspecified: Secondary | ICD-10-CM | POA: Diagnosis not present

## 2012-08-13 DIAGNOSIS — M24139 Other articular cartilage disorders, unspecified wrist: Secondary | ICD-10-CM | POA: Diagnosis not present

## 2012-08-16 DIAGNOSIS — N201 Calculus of ureter: Secondary | ICD-10-CM | POA: Diagnosis not present

## 2012-08-16 DIAGNOSIS — N200 Calculus of kidney: Secondary | ICD-10-CM | POA: Diagnosis not present

## 2012-08-25 DIAGNOSIS — M47817 Spondylosis without myelopathy or radiculopathy, lumbosacral region: Secondary | ICD-10-CM | POA: Diagnosis not present

## 2012-09-06 DIAGNOSIS — N189 Chronic kidney disease, unspecified: Secondary | ICD-10-CM | POA: Diagnosis not present

## 2012-09-06 DIAGNOSIS — IMO0001 Reserved for inherently not codable concepts without codable children: Secondary | ICD-10-CM | POA: Diagnosis present

## 2012-09-06 DIAGNOSIS — N400 Enlarged prostate without lower urinary tract symptoms: Secondary | ICD-10-CM | POA: Diagnosis not present

## 2012-09-06 DIAGNOSIS — R5383 Other fatigue: Secondary | ICD-10-CM | POA: Diagnosis not present

## 2012-09-06 DIAGNOSIS — R5381 Other malaise: Secondary | ICD-10-CM | POA: Diagnosis not present

## 2012-09-06 DIAGNOSIS — R609 Edema, unspecified: Secondary | ICD-10-CM | POA: Diagnosis not present

## 2012-09-06 DIAGNOSIS — N200 Calculus of kidney: Secondary | ICD-10-CM | POA: Diagnosis not present

## 2012-09-06 DIAGNOSIS — K219 Gastro-esophageal reflux disease without esophagitis: Secondary | ICD-10-CM | POA: Diagnosis not present

## 2012-09-06 DIAGNOSIS — I871 Compression of vein: Secondary | ICD-10-CM | POA: Diagnosis not present

## 2012-09-06 DIAGNOSIS — R0602 Shortness of breath: Secondary | ICD-10-CM | POA: Diagnosis not present

## 2012-09-06 DIAGNOSIS — Z79899 Other long term (current) drug therapy: Secondary | ICD-10-CM | POA: Diagnosis not present

## 2012-09-06 DIAGNOSIS — I270 Primary pulmonary hypertension: Secondary | ICD-10-CM | POA: Diagnosis not present

## 2012-09-06 DIAGNOSIS — K5909 Other constipation: Secondary | ICD-10-CM | POA: Diagnosis not present

## 2012-09-06 DIAGNOSIS — Z23 Encounter for immunization: Secondary | ICD-10-CM | POA: Diagnosis not present

## 2012-09-06 DIAGNOSIS — I2699 Other pulmonary embolism without acute cor pulmonale: Secondary | ICD-10-CM

## 2012-09-06 DIAGNOSIS — Z87448 Personal history of other diseases of urinary system: Secondary | ICD-10-CM | POA: Diagnosis not present

## 2012-09-06 DIAGNOSIS — N179 Acute kidney failure, unspecified: Secondary | ICD-10-CM | POA: Diagnosis not present

## 2012-09-06 DIAGNOSIS — N289 Disorder of kidney and ureter, unspecified: Secondary | ICD-10-CM | POA: Diagnosis not present

## 2012-09-06 HISTORY — DX: Other pulmonary embolism without acute cor pulmonale: I26.99

## 2012-09-13 DIAGNOSIS — N200 Calculus of kidney: Secondary | ICD-10-CM | POA: Diagnosis not present

## 2012-09-13 DIAGNOSIS — I2699 Other pulmonary embolism without acute cor pulmonale: Secondary | ICD-10-CM | POA: Diagnosis not present

## 2012-09-13 DIAGNOSIS — N170 Acute kidney failure with tubular necrosis: Secondary | ICD-10-CM | POA: Diagnosis not present

## 2012-09-15 DIAGNOSIS — N200 Calculus of kidney: Secondary | ICD-10-CM | POA: Diagnosis not present

## 2012-09-15 DIAGNOSIS — I2699 Other pulmonary embolism without acute cor pulmonale: Secondary | ICD-10-CM | POA: Diagnosis not present

## 2012-09-17 DIAGNOSIS — I2699 Other pulmonary embolism without acute cor pulmonale: Secondary | ICD-10-CM | POA: Diagnosis not present

## 2012-09-20 DIAGNOSIS — N200 Calculus of kidney: Secondary | ICD-10-CM | POA: Diagnosis not present

## 2012-09-20 DIAGNOSIS — I2699 Other pulmonary embolism without acute cor pulmonale: Secondary | ICD-10-CM | POA: Diagnosis not present

## 2012-09-24 ENCOUNTER — Other Ambulatory Visit: Payer: Self-pay | Admitting: Urology

## 2012-09-24 DIAGNOSIS — N390 Urinary tract infection, site not specified: Secondary | ICD-10-CM | POA: Diagnosis not present

## 2012-09-24 DIAGNOSIS — I2699 Other pulmonary embolism without acute cor pulmonale: Secondary | ICD-10-CM | POA: Diagnosis not present

## 2012-09-28 ENCOUNTER — Encounter (HOSPITAL_COMMUNITY): Payer: Self-pay | Admitting: Pharmacy Technician

## 2012-10-01 ENCOUNTER — Other Ambulatory Visit: Payer: Self-pay | Admitting: Urology

## 2012-10-01 DIAGNOSIS — I2699 Other pulmonary embolism without acute cor pulmonale: Secondary | ICD-10-CM | POA: Diagnosis not present

## 2012-10-05 ENCOUNTER — Encounter (HOSPITAL_COMMUNITY): Payer: Self-pay

## 2012-10-05 ENCOUNTER — Encounter (HOSPITAL_COMMUNITY)
Admission: RE | Admit: 2012-10-05 | Discharge: 2012-10-05 | Disposition: A | Discharge status: Home / Self Care | Payer: Medicare Other | Source: Ambulatory Visit | Attending: Urology | Admitting: Urology

## 2012-10-05 ENCOUNTER — Ambulatory Visit (HOSPITAL_COMMUNITY)
Admission: RE | Admit: 2012-10-05 | Discharge: 2012-10-05 | Disposition: A | Discharge status: Home / Self Care | Payer: Medicare Other | Source: Ambulatory Visit | Attending: Urology | Admitting: Urology

## 2012-10-05 DIAGNOSIS — R3129 Other microscopic hematuria: Secondary | ICD-10-CM | POA: Diagnosis not present

## 2012-10-05 DIAGNOSIS — R82998 Other abnormal findings in urine: Secondary | ICD-10-CM | POA: Diagnosis not present

## 2012-10-05 DIAGNOSIS — Z01818 Encounter for other preprocedural examination: Secondary | ICD-10-CM | POA: Diagnosis not present

## 2012-10-05 DIAGNOSIS — N210 Calculus in bladder: Secondary | ICD-10-CM | POA: Diagnosis not present

## 2012-10-05 DIAGNOSIS — N201 Calculus of ureter: Secondary | ICD-10-CM | POA: Diagnosis not present

## 2012-10-05 HISTORY — DX: Other complications of anesthesia, initial encounter: T88.59XA

## 2012-10-05 HISTORY — DX: Other pulmonary embolism without acute cor pulmonale: I26.99

## 2012-10-05 HISTORY — DX: Adverse effect of unspecified anesthetic, initial encounter: T41.45XA

## 2012-10-05 HISTORY — DX: Retention of urine, unspecified: R33.9

## 2012-10-05 LAB — BASIC METABOLIC PANEL
BUN: 19 mg/dL (ref 6–23)
CO2: 29 mEq/L (ref 19–32)
Calcium: 10 mg/dL (ref 8.4–10.5)
Creatinine, Ser: 1.67 mg/dL — ABNORMAL HIGH (ref 0.50–1.35)
Glucose, Bld: 98 mg/dL (ref 70–99)

## 2012-10-05 LAB — CBC
Hemoglobin: 14.9 g/dL (ref 13.0–17.0)
MCH: 29.1 pg (ref 26.0–34.0)
MCHC: 32.8 g/dL (ref 30.0–36.0)
MCV: 88.7 fL (ref 78.0–100.0)
RBC: 5.12 MIL/uL (ref 4.22–5.81)

## 2012-10-05 LAB — PROTIME-INR: Prothrombin Time: 15.8 seconds — ABNORMAL HIGH (ref 11.6–15.2)

## 2012-10-05 NOTE — Patient Instructions (Signed)
YOUR SURGERY IS SCHEDULED AT [facility]  ON:  Thursday  1/2  REPORT TO [place] SHORT STAY CENTER AT:  7:30 AM      PHONE # FOR SHORT STAY IS [phone]  DO NOT EAT OR DRINK ANYTHING AFTER MIDNIGHT THE NIGHT BEFORE YOUR SURGERY.  YOU MAY BRUSH YOUR TEETH, RINSE OUT YOUR MOUTH--BUT NO WATER, NO FOOD, NO CHEWING GUM, NO MINTS, NO CANDIES, NO CHEWING TOBACCO.  PLEASE TAKE THE FOLLOWING MEDICATIONS THE AM OF YOUR SURGERY WITH A FEW SIPS OF WATER:  OMEPRAZOLE, ALLOPURINOL, CARDURA, RAPAFLO.   USE YOUR ALBUTEROL INHALER AND BRING TO HOSPITAL.  IF YOU USE INHALERS--USE YOUR INHALERS THE AM OF YOUR SURGERY AND BRING INHALERS TO THE HOSPITAL -TAKE TO SURGERY.    IF YOU ARE DIABETIC:  DO NOT TAKE ANY DIABETIC MEDICATIONS THE AM OF YOUR SURGERY.  IF YOU TAKE INSULIN IN THE EVENINGS--PLEASE ONLY TAKE 1/2 NORMAL EVENING DOSE THE NIGHT BEFORE YOUR SURGERY.  NO INSULIN THE AM OF YOUR SURGERY.  IF YOU HAVE SLEEP APNEA AND USE CPAP OR BIPAP--PLEASE BRING THE MASK AND THE TUBING.  DO NOT BRING YOUR MACHINE.  DO NOT BRING VALUABLES, MONEY, CREDIT CARDS.  DO NOT WEAR JEWELRY, MAKE-UP, NAIL POLISH AND NO METAL PINS OR CLIPS IN YOUR HAIR. CONTACT LENS, DENTURES / PARTIALS, GLASSES SHOULD NOT BE WORN TO SURGERY AND IN MOST CASES-HEARING AIDS WILL NEED TO BE REMOVED.  BRING YOUR GLASSES CASE, ANY EQUIPMENT NEEDED FOR YOUR CONTACT LENS. FOR PATIENTS ADMITTED TO THE HOSPITAL--CHECK OUT TIME THE DAY OF DISCHARGE IS 11:00 AM.  ALL INPATIENT ROOMS ARE PRIVATE - WITH BATHROOM, TELEPHONE, TELEVISION AND WIFI INTERNET.  IF YOU ARE BEING DISCHARGED THE SAME DAY OF YOUR SURGERY--YOU CAN NOT DRIVE YOURSELF HOME--AND SHOULD NOT GO HOME ALONE BY TAXI OR BUS.  NO DRIVING OR OPERATING MACHINERY FOR 24 HOURS FOLLOWING ANESTHESIA / PAIN MEDICATIONS.  PLEASE MAKE ARRANGEMENTS FOR SOMEONE TO BE WITH YOU AT HOME THE FIRST 24 HOURS AFTER SURGERY. RESPONSIBLE DRIVER'S NAME  PT'S SON  ANDREW MCMOURRY                      PHONE #   [phone]                            PLEASE READ OVER ANY  FACT SHEETS THAT YOU WERE GIVEN: MRSA INFORMATION, BLOOD TRANSFUSION INFORMATION, INCENTIVE SPIROMETER INFORMATION. FAILURE TO FOLLOW THESE INSTRUCTIONS MAY RESULT IN THE CANCELLATION OF YOUR SURGERY.   PATIENT SIGNATURE_________________________________

## 2012-10-05 NOTE — Pre-Procedure Instructions (Addendum)
PREOP CBC, BMET PT, PTT, CXR WERE DONE TODAY AT [facility] AS PER ORDERS DR. DAHLSTEDT AND ANESTHESIOLOGIST'S GUIDELINES.  PT HAS EKG REPORT 09/06/12 ON CHART FROM [facility].  CXR WAS REPEATED TODAY BECAUSE OF PT'S DX OF PULMONARY EMBOLUS 09/06/12. PREOP BMET REPORT - CREATININE 1.67 AND PT 15.8  INR NORMAL  FAXED TO DR. DAHLSTEDT'S OFFICE FOR REVIEW. PT'S CLEARANCE FOR  UROLOGY SURGERY FROM DR. VAN EYK ON PT'S CHART-WITH OK TO STOP COUMADIN.

## 2012-10-06 MED ORDER — CEFAZOLIN SODIUM-DEXTROSE 2-3 GM-% IV SOLR: 2.0000 g | INTRAVENOUS | Status: DC

## 2012-10-07 ENCOUNTER — Ambulatory Visit (HOSPITAL_COMMUNITY): Admission: RE | Admit: 2012-10-07 | Payer: Medicare Other | Source: Ambulatory Visit | Admitting: Urology

## 2012-10-07 ENCOUNTER — Encounter (HOSPITAL_COMMUNITY): Admission: RE | Payer: Self-pay | Source: Ambulatory Visit

## 2012-10-07 DIAGNOSIS — I2699 Other pulmonary embolism without acute cor pulmonale: Secondary | ICD-10-CM | POA: Diagnosis not present

## 2012-10-07 DIAGNOSIS — E559 Vitamin D deficiency, unspecified: Secondary | ICD-10-CM | POA: Diagnosis not present

## 2012-10-07 SURGERY — CYSTOSCOPY, WITH CALCULUS REMOVAL USING BASKET
Anesthesia: General | Laterality: Right

## 2012-10-11 DIAGNOSIS — I2699 Other pulmonary embolism without acute cor pulmonale: Secondary | ICD-10-CM | POA: Diagnosis not present

## 2012-10-15 DIAGNOSIS — J018 Other acute sinusitis: Secondary | ICD-10-CM | POA: Diagnosis not present

## 2012-10-15 DIAGNOSIS — I2699 Other pulmonary embolism without acute cor pulmonale: Secondary | ICD-10-CM | POA: Diagnosis not present

## 2012-10-20 DIAGNOSIS — M109 Gout, unspecified: Secondary | ICD-10-CM | POA: Diagnosis not present

## 2012-10-22 DIAGNOSIS — I2699 Other pulmonary embolism without acute cor pulmonale: Secondary | ICD-10-CM | POA: Diagnosis not present

## 2012-10-29 DIAGNOSIS — I2699 Other pulmonary embolism without acute cor pulmonale: Secondary | ICD-10-CM | POA: Diagnosis not present

## 2012-11-12 DIAGNOSIS — I2699 Other pulmonary embolism without acute cor pulmonale: Secondary | ICD-10-CM | POA: Diagnosis not present

## 2012-11-22 DIAGNOSIS — I2699 Other pulmonary embolism without acute cor pulmonale: Secondary | ICD-10-CM | POA: Diagnosis not present

## 2012-11-22 DIAGNOSIS — H6690 Otitis media, unspecified, unspecified ear: Secondary | ICD-10-CM | POA: Diagnosis not present

## 2012-11-22 DIAGNOSIS — M549 Dorsalgia, unspecified: Secondary | ICD-10-CM | POA: Diagnosis not present

## 2012-11-24 DIAGNOSIS — N401 Enlarged prostate with lower urinary tract symptoms: Secondary | ICD-10-CM | POA: Diagnosis not present

## 2012-11-24 DIAGNOSIS — N200 Calculus of kidney: Secondary | ICD-10-CM | POA: Diagnosis not present

## 2012-11-24 DIAGNOSIS — E291 Testicular hypofunction: Secondary | ICD-10-CM | POA: Diagnosis not present

## 2012-11-30 DIAGNOSIS — Z7901 Long term (current) use of anticoagulants: Secondary | ICD-10-CM | POA: Diagnosis not present

## 2012-11-30 DIAGNOSIS — I2699 Other pulmonary embolism without acute cor pulmonale: Secondary | ICD-10-CM | POA: Diagnosis not present

## 2012-11-30 DIAGNOSIS — I82409 Acute embolism and thrombosis of unspecified deep veins of unspecified lower extremity: Secondary | ICD-10-CM | POA: Diagnosis not present

## 2012-11-30 DIAGNOSIS — M47817 Spondylosis without myelopathy or radiculopathy, lumbosacral region: Secondary | ICD-10-CM | POA: Diagnosis not present

## 2012-12-03 DIAGNOSIS — I2699 Other pulmonary embolism without acute cor pulmonale: Secondary | ICD-10-CM | POA: Diagnosis not present

## 2012-12-03 DIAGNOSIS — N390 Urinary tract infection, site not specified: Secondary | ICD-10-CM | POA: Diagnosis not present

## 2012-12-13 DIAGNOSIS — N390 Urinary tract infection, site not specified: Secondary | ICD-10-CM | POA: Diagnosis not present

## 2012-12-28 DIAGNOSIS — M47817 Spondylosis without myelopathy or radiculopathy, lumbosacral region: Secondary | ICD-10-CM | POA: Diagnosis not present

## 2013-01-11 DIAGNOSIS — M109 Gout, unspecified: Secondary | ICD-10-CM | POA: Diagnosis not present

## 2013-02-16 DIAGNOSIS — M5137 Other intervertebral disc degeneration, lumbosacral region: Secondary | ICD-10-CM | POA: Diagnosis not present

## 2013-03-02 DIAGNOSIS — N390 Urinary tract infection, site not specified: Secondary | ICD-10-CM | POA: Diagnosis not present

## 2013-03-02 DIAGNOSIS — N410 Acute prostatitis: Secondary | ICD-10-CM | POA: Diagnosis not present

## 2013-03-09 DIAGNOSIS — M1710 Unilateral primary osteoarthritis, unspecified knee: Secondary | ICD-10-CM | POA: Diagnosis not present

## 2013-03-09 DIAGNOSIS — IMO0002 Reserved for concepts with insufficient information to code with codable children: Secondary | ICD-10-CM | POA: Diagnosis not present

## 2013-03-09 DIAGNOSIS — M069 Rheumatoid arthritis, unspecified: Secondary | ICD-10-CM | POA: Diagnosis not present

## 2013-03-09 DIAGNOSIS — M109 Gout, unspecified: Secondary | ICD-10-CM | POA: Diagnosis not present

## 2013-03-18 DIAGNOSIS — J018 Other acute sinusitis: Secondary | ICD-10-CM | POA: Diagnosis not present

## 2013-03-22 DIAGNOSIS — M069 Rheumatoid arthritis, unspecified: Secondary | ICD-10-CM | POA: Diagnosis not present

## 2013-03-23 DIAGNOSIS — L820 Inflamed seborrheic keratosis: Secondary | ICD-10-CM | POA: Diagnosis not present

## 2013-03-23 DIAGNOSIS — D235 Other benign neoplasm of skin of trunk: Secondary | ICD-10-CM | POA: Diagnosis not present

## 2013-03-23 DIAGNOSIS — L821 Other seborrheic keratosis: Secondary | ICD-10-CM | POA: Diagnosis not present

## 2013-03-28 DIAGNOSIS — N401 Enlarged prostate with lower urinary tract symptoms: Secondary | ICD-10-CM | POA: Diagnosis not present

## 2013-03-28 DIAGNOSIS — E291 Testicular hypofunction: Secondary | ICD-10-CM | POA: Diagnosis not present

## 2013-03-28 DIAGNOSIS — N200 Calculus of kidney: Secondary | ICD-10-CM | POA: Diagnosis not present

## 2013-04-19 DIAGNOSIS — M069 Rheumatoid arthritis, unspecified: Secondary | ICD-10-CM | POA: Diagnosis not present

## 2013-04-19 DIAGNOSIS — E291 Testicular hypofunction: Secondary | ICD-10-CM | POA: Diagnosis not present

## 2013-04-19 DIAGNOSIS — M2550 Pain in unspecified joint: Secondary | ICD-10-CM | POA: Diagnosis not present

## 2013-04-19 DIAGNOSIS — M109 Gout, unspecified: Secondary | ICD-10-CM | POA: Diagnosis not present

## 2013-04-20 ENCOUNTER — Telehealth: Payer: Self-pay | Admitting: Internal Medicine

## 2013-04-20 NOTE — Telephone Encounter (Signed)
Refill- levocetirizi dhcl 5mg . Take one tablet every evening. Qty 90 last fill 5.13.14

## 2013-04-26 DIAGNOSIS — M545 Low back pain, unspecified: Secondary | ICD-10-CM | POA: Diagnosis not present

## 2013-05-10 DIAGNOSIS — IMO0002 Reserved for concepts with insufficient information to code with codable children: Secondary | ICD-10-CM | POA: Diagnosis not present

## 2013-05-10 DIAGNOSIS — M1710 Unilateral primary osteoarthritis, unspecified knee: Secondary | ICD-10-CM | POA: Diagnosis not present

## 2013-05-16 DIAGNOSIS — I5030 Unspecified diastolic (congestive) heart failure: Secondary | ICD-10-CM | POA: Diagnosis not present

## 2013-05-16 DIAGNOSIS — M069 Rheumatoid arthritis, unspecified: Secondary | ICD-10-CM | POA: Diagnosis not present

## 2013-05-16 DIAGNOSIS — M129 Arthropathy, unspecified: Secondary | ICD-10-CM | POA: Diagnosis not present

## 2013-05-16 DIAGNOSIS — J018 Other acute sinusitis: Secondary | ICD-10-CM | POA: Diagnosis not present

## 2013-05-16 DIAGNOSIS — M109 Gout, unspecified: Secondary | ICD-10-CM | POA: Diagnosis not present

## 2013-05-16 DIAGNOSIS — N400 Enlarged prostate without lower urinary tract symptoms: Secondary | ICD-10-CM | POA: Diagnosis not present

## 2013-06-07 ENCOUNTER — Other Ambulatory Visit: Payer: Self-pay | Admitting: Orthopedic Surgery

## 2013-06-07 ENCOUNTER — Encounter (HOSPITAL_COMMUNITY): Payer: Self-pay | Admitting: Pharmacy Technician

## 2013-06-13 DIAGNOSIS — IMO0002 Reserved for concepts with insufficient information to code with codable children: Secondary | ICD-10-CM | POA: Diagnosis not present

## 2013-06-13 DIAGNOSIS — M1710 Unilateral primary osteoarthritis, unspecified knee: Secondary | ICD-10-CM | POA: Diagnosis not present

## 2013-06-14 ENCOUNTER — Encounter (HOSPITAL_COMMUNITY): Payer: Self-pay

## 2013-06-14 ENCOUNTER — Other Ambulatory Visit (HOSPITAL_COMMUNITY): Payer: Self-pay | Admitting: *Deleted

## 2013-06-14 ENCOUNTER — Encounter (HOSPITAL_COMMUNITY)
Admission: RE | Admit: 2013-06-14 | Discharge: 2013-06-14 | Disposition: A | Discharge status: Home / Self Care | Payer: Medicare Other | Source: Ambulatory Visit | Attending: Orthopedic Surgery | Admitting: Orthopedic Surgery

## 2013-06-14 DIAGNOSIS — M1710 Unilateral primary osteoarthritis, unspecified knee: Secondary | ICD-10-CM | POA: Diagnosis not present

## 2013-06-14 DIAGNOSIS — N400 Enlarged prostate without lower urinary tract symptoms: Secondary | ICD-10-CM | POA: Diagnosis not present

## 2013-06-14 DIAGNOSIS — K589 Irritable bowel syndrome without diarrhea: Secondary | ICD-10-CM | POA: Diagnosis not present

## 2013-06-14 DIAGNOSIS — IMO0002 Reserved for concepts with insufficient information to code with codable children: Secondary | ICD-10-CM | POA: Diagnosis not present

## 2013-06-14 DIAGNOSIS — M25569 Pain in unspecified knee: Secondary | ICD-10-CM | POA: Diagnosis not present

## 2013-06-14 DIAGNOSIS — Z01812 Encounter for preprocedural laboratory examination: Secondary | ICD-10-CM | POA: Diagnosis not present

## 2013-06-14 DIAGNOSIS — K219 Gastro-esophageal reflux disease without esophagitis: Secondary | ICD-10-CM | POA: Diagnosis not present

## 2013-06-14 HISTORY — DX: Essential (primary) hypertension: I10

## 2013-06-14 LAB — URINALYSIS, ROUTINE W REFLEX MICROSCOPIC
Nitrite: NEGATIVE
Specific Gravity, Urine: 1.025 (ref 1.005–1.030)
Urobilinogen, UA: 0.2 mg/dL (ref 0.0–1.0)
pH: 5 (ref 5.0–8.0)

## 2013-06-14 LAB — URINE MICROSCOPIC-ADD ON

## 2013-06-14 LAB — SURGICAL PCR SCREEN: Staphylococcus aureus: NEGATIVE

## 2013-06-14 LAB — CBC
MCHC: 34.5 g/dL (ref 30.0–36.0)
MCV: 89.3 fL (ref 78.0–100.0)
Platelets: 206 10*3/uL (ref 150–400)
RDW: 13.4 % (ref 11.5–15.5)
WBC: 7.5 10*3/uL (ref 4.0–10.5)

## 2013-06-14 LAB — COMPREHENSIVE METABOLIC PANEL
AST: 21 U/L (ref 0–37)
Albumin: 3.7 g/dL (ref 3.5–5.2)
BUN: 30 mg/dL — ABNORMAL HIGH (ref 6–23)
Calcium: 9.9 mg/dL (ref 8.4–10.5)
Chloride: 102 mEq/L (ref 96–112)
Creatinine, Ser: 1.72 mg/dL — ABNORMAL HIGH (ref 0.50–1.35)
Total Bilirubin: 0.7 mg/dL (ref 0.3–1.2)
Total Protein: 6.6 g/dL (ref 6.0–8.3)

## 2013-06-14 LAB — PROTIME-INR
INR: 0.87 (ref 0.00–1.49)
Prothrombin Time: 11.7 seconds (ref 11.6–15.2)

## 2013-06-14 NOTE — Patient Instructions (Addendum)
20      Your procedure is scheduled on:  Friday 06/17/2013  Report to [facility] at 0815  AM.  Call this number if you have problems the night before or morning of surgery:[phone]   Remember:             IF YOU USE CPAP,BRING MASK AND TUBING AM OF SURGERY!   Do not eat food or drink liquids AFTER MIDNIGHT!  Take these medicines the morning of surgery with A SIP OF WATER: Omeprazole,use Albuterol inhaler if needed and  Flonase nasal spray    Do not bring valuables to the hospital. [facility] IS NOT RESPONSIBLE  FOR ANY BELONGINGS OR VALUABLES BROUGHT TO HOSPITAL.  Marland Kitchen  Leave suitcase in the car. After surgery it may be brought to your room.  For patients admitted to the hospital, checkout time is 11:00 AM the day of              Discharge.    DO NOT WEAR JEWELRY , MAKE-UP, LOTIONS,POWDERS,PERFUMES!             WOMEN -DO NOT SHAVE LEGS OR UNDERARMS 12 HRS. BEFORE SURGERY!               MEN MAY SHAVE AS USUAL!             CONTACTS,DENTURES OR BRIDGEWORK, FALSE EYELASHES MAY NOT BE WORN INTO SURGERY!                                           Patients discharged the day of surgery will not be allowed to drive home. If going home the same day of surgery, must have someone stay with you  first 24 hrs.at home and arrange for someone to drive you home from the Hospital.                       YOUR DRIVER IS:   Special Instructions:             Please read over the following fact sheets that you were given:             1. [place] PREPARING FOR SURGERY SHEET              2.[employer] SP[employer]ETRY                                        [place].[name],RN,BSN     [phone]                FAILURE TO FOLLOW THESE INSTRUCTIONS MAY RESULT IN  CANCELLATION OF YOUR SURGERY!               Patient Signature:___________________________

## 2013-06-14 NOTE — Progress Notes (Signed)
EKG from 05/16/2013 and office note from 05/16/2013 from [facility] on chart.

## 2013-06-14 NOTE — H&P (Signed)
TOTAL KNEE ADMISSION H&P  Patient is being admitted for right total knee arthroplasty.  Subjective:  Chief Complaint:right knee pain.  HPI: [name], 67 y.o. male, has a history of pain and functional disability in the right knee due to arthritis and has failed non-surgical conservative treatments for greater than 12 weeks to includeNSAID's and/or analgesics, corticosteriod injections, viscosupplementation injections and activity modification.  Onset of symptoms was gradual, starting 4 years ago with gradually worsening course since that time. The patient noted no past surgery on the right knee(s).  Patient currently rates pain in the right knee(s) at 5 out of 10 with activity. Patient has night pain, worsening of pain with activity and weight bearing, pain that interferes with activities of daily living, crepitus and joint swelling.  Patient has evidence of periarticular osteophytes and joint space narrowing by imaging studies. There is no active infection.  Patient Active Problem List   Diagnosis Date Noted  . Onychomycosis 03/21/2012  . Sinusitis 12/10/2011  . BPH (benign prostatic hypertrophy) with urinary obstruction 09/01/2011  . Arthralgia 09/01/2011  . SHOULDER PAIN, LEFT 07/23/2010  . NOCTURIA 07/23/2010  . IBS 05/28/2010  . UNSPECIFIED TESTICULAR DYSFUNCTION 04/11/2010  . UNSPECIFIED VITAMIN D DEFICIENCY 04/11/2010  . MYOSITIS 04/02/2010  . ARTHRITIS, CERVICAL SPINE 02/12/2010  . OSTEOPENIA 11/27/2009  . OSTEOARTHRITIS, ANKLES, BILATERAL 05/31/2009  . ANXIETY 03/13/2009  . BACK PAIN, CHRONIC 09/14/2008  . URETERAL CALCULUS, HX OF 05/30/2008  . ASTHMA 12/15/2007  . INSOMNIA 12/15/2007  . GERD 09/28/2007  . HYPOTHYROIDISM 09/07/2007  . HYPERLIPIDEMIA 09/07/2007  . UNS ADVRS EFF OTH RX MEDICINAL&BIOLOGICAL SBSTNC 09/07/2007   Past Medical History  Diagnosis Date  . Arthritis   . Asthma   . GERD (gastroesophageal reflux disease)   . Hyperlipidemia   . BPH  (benign prostatic hyperplasia)   . Seasonal allergies   . IBS (irritable bowel syndrome)   . Pulmonary embolus 09/06/12    HOSP AT [facility] AND PLACED ON COUMADIN  . Urinary retention     HX OF KIDNEY STONES-BILATERAL, HX BPH  -- PT TOLD HIS KIDNEY FUNCTIONS  ELVATED WHILE HOSP DEC 2013 FOR PULMONARY EMBOLUS  . Complication of anesthesia     PROBLEMS VOIDING AFTER HERNIA REPAIR    Past Surgical History  Procedure Laterality Date  . Appendectomy    . Cholecystectomy    . Lumbar laminectomy    . Tonsillectomy    . Hernia repair  2011    umb  . Right hand surgery 06/30/12       Current outpatient prescriptions: albuterol (PROVENTIL HFA;VENTOLIN HFA) 108 (90 BASE) MCG/ACT inhaler, Inhale 2 puffs into the lungs. As needed for wheezing, Disp: , Rfl: ;   allopurinol (ZYLOPRIM) 300 MG tablet, Take 300 mg by mouth daily., Disp: , Rfl: ;   Cholecalciferol (VITAMIN D) 2000 UNITS CAPS, Take 2,000 Units by mouth 3 (three) times daily. , Disp: , Rfl: ;   doxazosin (CARDURA) 2 MG tablet, Take 2 mg by mouth every morning., Disp: , Rfl:  fluticasone (FLONASE) 50 MCG/ACT nasal spray, Place 2 sprays into the nose daily as needed. For allergies, Disp: , Rfl: ;   HYDROcodone-acetaminophen (NORCO) 10-325 MG per tablet, Take 1 tablet by mouth every 4 (four) hours as needed for pain., Disp: , Rfl: ;   hyoscyamine (LEVSIN, ANASPAZ) 0.125 MG tablet, Take 0.125 mg by mouth every 4 (four) hours as needed for cramping., Disp: , Rfl:  levocetirizine (XYZAL) 5 MG tablet, Take 5 mg  by mouth daily as needed. For allergies, Disp: , Rfl: ;   meclizine (ANTIVERT) 25 MG tablet, Take 25 mg by mouth 2 (two) times daily as needed. Take mid afternoon and at bedtime to prevent dizziness, Disp: , Rfl: ;   omeprazole (PRILOSEC) 20 MG capsule, Take 1 capsule (20 mg total) by mouth daily., Disp: 90 capsule, Rfl: 3 potassium citrate (UROCIT-K) 10 MEQ (1080 MG) SR tablet, Take 10 mEq by mouth 2 (two) times daily., Disp: ,  Rfl: ;   predniSONE (DELTASONE) 10 MG tablet, Take 5-10 mg by mouth daily as needed. For gout flare up, Disp: , Rfl: ;   senna (SENOKOT) 8.6 MG tablet, Take 2-4 tablets by mouth at bedtime as needed for constipation. , Disp: , Rfl:  Testosterone (ANDROGEL PUMP) 12.5 MG/ACT (1%) GEL, Place 2 Act onto the skin 3 (three) times a week., Disp: , Rfl: ;   triamcinolone (KENALOG) 0.025 % cream, Apply 1 application topically 2 (two) times daily as needed. For dry spots on hands, Disp: , Rfl: ;   triamterene-hydrochlorothiazide (MAXZIDE) 75-50 MG per tablet, Take 0.5 tablets by mouth every morning., Disp: , Rfl:  triazolam (HALCION) 0.25 MG tablet, Take 0.25 mg by mouth at bedtime as needed (for sleep)., Disp: , Rfl:   Allergies  Allergen Reactions  . Caffeine     Urinary retention  . Codeine     "feel jittery"  . Meperidine Hcl   . Pseudoephedrine     Urinary retention  . Simvastatin Other (See Comments)    Hurt my bones    History  Substance Use Topics  . Smoking status: Never Smoker   . Smokeless tobacco: Never Used  . Alcohol Use: No    Father deceased age 76 due to cancer Mother deceased age 71 due to stroke   Review of Systems  Constitutional: Negative.   HENT: Negative.  Negative for neck pain.   Eyes: Negative.   Respiratory: Positive for wheezing. Negative for cough, hemoptysis, sputum production and shortness of breath.   Cardiovascular: Negative.   Gastrointestinal: Positive for constipation. Negative for heartburn, nausea, vomiting, abdominal pain, diarrhea, blood in stool and melena.  Genitourinary: Positive for frequency. Negative for dysuria, urgency, hematuria and flank pain.  Musculoskeletal: Positive for back pain and joint pain. Negative for myalgias and falls.  Skin: Negative.   Neurological: Negative.   Endo/Heme/Allergies: Negative.   Psychiatric/Behavioral: Negative.     Objective:  Physical Exam  Constitutional: He is oriented to person, place, and time.  He appears well-developed and well-nourished. No distress.  HENT:  Head: Normocephalic and atraumatic.  Right Ear: External ear normal.  Left Ear: External ear normal.  Nose: Nose normal.  Mouth/Throat: Oropharynx is clear and moist.  Eyes: Conjunctivae and EOM are normal.  Neck: Normal range of motion. Neck supple.  Cardiovascular: Normal rate, regular rhythm, normal heart sounds and intact distal pulses.   No murmur heard. Respiratory: Effort normal and breath sounds normal. No respiratory distress. He has no wheezes.  GI: Soft. Bowel sounds are normal. He exhibits no distension. There is no tenderness.  Musculoskeletal:       Right hip: Normal.       Left hip: Normal.       Right knee: He exhibits decreased range of motion and swelling. He exhibits no effusion and no erythema. Tenderness found. Medial joint line tenderness noted.       Left knee: Normal.       Right lower leg: He  exhibits no tenderness and no swelling.       Left lower leg: He exhibits no tenderness and no swelling.  His hips show normal range of motion. No discomfort. The left knee no effusion with range 0 to 125. There is no tenderness or instability. The right knee significant varus and flexion deformity. Range is about 10 to 110. Marked crepitus on range of motion with tenderness medial greater than lateral and no instability noted.  Neurological: He is alert and oriented to person, place, and time. He has normal strength and normal reflexes. No sensory deficit.  Skin: No rash noted. He is not diaphoretic. No erythema.  Psychiatric: He has a normal mood and affect. His behavior is normal.    Vitals Pulse: 88 (Regular) BP: 122/78 (Sitting, Left Arm, Standard)   Estimated body mass index is 27.78 kg/(m^2) as calculated from the following:   Height as of 10/05/12: 5' 11.5" (1.816 m).   Weight as of 10/05/12: 91.627 kg (202 lb).   Imaging Review Plain radiographs demonstrate severe degenerative  joint disease of the right knee(s). The overall alignment issignificant varus. The bone quality appears to be good for age and reported activity level.  Assessment/Plan:  End stage arthritis, right knee   The patient history, physical examination, clinical judgment of the provider and imaging studies are consistent with end stage degenerative joint disease of the right knee(s) and total knee arthroplasty is deemed medically necessary. The treatment options including medical management, injection therapy arthroscopy and arthroplasty were discussed at length. The risks and benefits of total knee arthroplasty were presented and reviewed. The risks due to aseptic loosening, infection, stiffness, patella tracking problems, thromboembolic complications and other imponderables were discussed. The patient acknowledged the explanation, agreed to proceed with the plan and consent was signed. Patient is being admitted for inpatient treatment for surgery, pain control, PT, OT, prophylactic antibiotics, VTE prophylaxis, progressive ambulation and ADL's and discharge planning. The patient is planning to be discharged home with home health services      [place], [place]

## 2013-06-17 ENCOUNTER — Encounter (HOSPITAL_COMMUNITY)
Admission: RE | Disposition: A | Discharge status: Home Health | Payer: Medicare Other | Source: Ambulatory Visit | Attending: Orthopedic Surgery

## 2013-06-17 ENCOUNTER — Encounter (HOSPITAL_COMMUNITY): Payer: Self-pay | Admitting: *Deleted

## 2013-06-17 ENCOUNTER — Inpatient Hospital Stay (HOSPITAL_COMMUNITY): Payer: Medicare Other | Admitting: Anesthesiology

## 2013-06-17 ENCOUNTER — Inpatient Hospital Stay (HOSPITAL_COMMUNITY)
Admission: RE | Admit: 2013-06-17 | Discharge: 2013-06-19 | DRG: 470 | Disposition: A | Discharge status: Home Health | Payer: Medicare Other | Source: Ambulatory Visit | Attending: Orthopedic Surgery | Admitting: Orthopedic Surgery

## 2013-06-17 ENCOUNTER — Encounter (HOSPITAL_COMMUNITY): Payer: Self-pay | Admitting: Anesthesiology

## 2013-06-17 DIAGNOSIS — Z01812 Encounter for preprocedural laboratory examination: Secondary | ICD-10-CM

## 2013-06-17 DIAGNOSIS — Z96651 Presence of right artificial knee joint: Secondary | ICD-10-CM

## 2013-06-17 DIAGNOSIS — M1710 Unilateral primary osteoarthritis, unspecified knee: Principal | ICD-10-CM | POA: Diagnosis present

## 2013-06-17 HISTORY — PX: TOTAL KNEE ARTHROPLASTY: SHX125

## 2013-06-17 LAB — TYPE AND SCREEN: Antibody Screen: NEGATIVE

## 2013-06-17 SURGERY — ARTHROPLASTY, KNEE, TOTAL
Anesthesia: Spinal | Site: Knee | Laterality: Right | Wound class: Clean

## 2013-06-17 MED ORDER — ONDANSETRON HCL 4 MG/2ML IJ SOLN: 4.0000 mg | Freq: Four times a day (QID) | INTRAMUSCULAR | Status: DC | PRN

## 2013-06-17 MED ORDER — ONDANSETRON HCL 4 MG PO TABS: 4.0000 mg | ORAL_TABLET | Freq: Four times a day (QID) | ORAL | Status: DC | PRN

## 2013-06-17 MED ORDER — TRIAMTERENE-HCTZ 75-50 MG PO TABS
0.5000 | ORAL_TABLET | Freq: Every morning | ORAL | Status: DC
  Filled 2013-06-17: qty 0.5

## 2013-06-17 MED ORDER — DEXAMETHASONE 6 MG PO TABS
10.0000 mg | ORAL_TABLET | Freq: Every day | ORAL | Status: AC
  Administered 2013-06-18: 10 mg via ORAL
  Filled 2013-06-17: qty 1

## 2013-06-17 MED ORDER — ACETAMINOPHEN 500 MG PO TABS
1000.0000 mg | ORAL_TABLET | Freq: Four times a day (QID) | ORAL | Status: AC
  Administered 2013-06-17 – 2013-06-18 (×4): 1000 mg via ORAL
  Filled 2013-06-17 (×4): qty 2

## 2013-06-17 MED ORDER — ALLOPURINOL 300 MG PO TABS
300.0000 mg | ORAL_TABLET | Freq: Every day | ORAL | Status: DC
  Administered 2013-06-17 – 2013-06-19 (×3): 300 mg via ORAL
  Filled 2013-06-17 (×3): qty 1

## 2013-06-17 MED ORDER — LACTATED RINGERS IV SOLN
INTRAVENOUS | Status: DC
  Administered 2013-06-17: 12:00:00 via INTRAVENOUS
  Administered 2013-06-17: 1000 mL via INTRAVENOUS

## 2013-06-17 MED ORDER — MIDAZOLAM HCL 5 MG/5ML IJ SOLN
INTRAMUSCULAR | Status: DC | PRN
  Administered 2013-06-17: 2 mg via INTRAVENOUS

## 2013-06-17 MED ORDER — KETOROLAC TROMETHAMINE 15 MG/ML IJ SOLN
7.5000 mg | Freq: Four times a day (QID) | INTRAMUSCULAR | Status: AC | PRN
  Administered 2013-06-17: 7.5 mg via INTRAVENOUS
  Filled 2013-06-17: qty 1

## 2013-06-17 MED ORDER — PANTOPRAZOLE SODIUM 40 MG PO TBEC
40.0000 mg | DELAYED_RELEASE_TABLET | Freq: Every day | ORAL | Status: DC
  Administered 2013-06-18 – 2013-06-19 (×2): 40 mg via ORAL
  Filled 2013-06-17 (×2): qty 1

## 2013-06-17 MED ORDER — DOXAZOSIN MESYLATE 2 MG PO TABS
2.0000 mg | ORAL_TABLET | Freq: Every morning | ORAL | Status: DC
Start: 2013-06-17 — End: 2013-06-19
  Administered 2013-06-17 – 2013-06-19 (×3): 2 mg via ORAL
  Filled 2013-06-17 (×3): qty 1

## 2013-06-17 MED ORDER — DEXAMETHASONE SODIUM PHOSPHATE 10 MG/ML IJ SOLN
10.0000 mg | Freq: Once | INTRAMUSCULAR | Status: AC
  Administered 2013-06-17: 10 mg via INTRAVENOUS

## 2013-06-17 MED ORDER — LACTATED RINGERS IV SOLN: INTRAVENOUS | Status: DC

## 2013-06-17 MED ORDER — KCL IN DEXTROSE-NACL 20-5-0.45 MEQ/L-%-% IV SOLN
INTRAVENOUS | Status: DC
  Administered 2013-06-17 – 2013-06-18 (×2): via INTRAVENOUS
  Filled 2013-06-17 (×6): qty 1000

## 2013-06-17 MED ORDER — FENTANYL CITRATE 0.05 MG/ML IJ SOLN
INTRAMUSCULAR | Status: DC | PRN
  Administered 2013-06-17: 100 ug via INTRAVENOUS

## 2013-06-17 MED ORDER — LEVOCETIRIZINE DIHYDROCHLORIDE 5 MG PO TABS
5.0000 mg | ORAL_TABLET | Freq: Every day | ORAL | Status: DC | PRN
Start: 2013-06-17 — End: 2013-06-17

## 2013-06-17 MED ORDER — TRAMADOL HCL 50 MG PO TABS: 50.0000 mg | ORAL_TABLET | Freq: Four times a day (QID) | ORAL | Status: DC | PRN

## 2013-06-17 MED ORDER — BISACODYL 10 MG RE SUPP: 10.0000 mg | Freq: Every day | RECTAL | Status: DC | PRN

## 2013-06-17 MED ORDER — HYOSCYAMINE SULFATE 0.125 MG SL SUBL
0.1250 mg | SUBLINGUAL_TABLET | SUBLINGUAL | Status: DC | PRN
  Filled 2013-06-17: qty 1

## 2013-06-17 MED ORDER — DOCUSATE SODIUM 100 MG PO CAPS
100.0000 mg | ORAL_CAPSULE | Freq: Two times a day (BID) | ORAL | Status: DC
  Administered 2013-06-17 – 2013-06-19 (×4): 100 mg via ORAL

## 2013-06-17 MED ORDER — LORATADINE 10 MG PO TABS
10.0000 mg | ORAL_TABLET | Freq: Every day | ORAL | Status: DC | PRN
  Filled 2013-06-17: qty 1

## 2013-06-17 MED ORDER — CEFAZOLIN SODIUM 1-5 GM-% IV SOLN
1.0000 g | Freq: Four times a day (QID) | INTRAVENOUS | Status: AC
  Administered 2013-06-17 (×2): 1 g via INTRAVENOUS
  Filled 2013-06-17 (×2): qty 50

## 2013-06-17 MED ORDER — ACETAMINOPHEN 500 MG PO TABS
1000.0000 mg | ORAL_TABLET | Freq: Once | ORAL | Status: AC
  Administered 2013-06-17: 1000 mg via ORAL
  Filled 2013-06-17: qty 2

## 2013-06-17 MED ORDER — MORPHINE SULFATE 2 MG/ML IJ SOLN
1.0000 mg | INTRAMUSCULAR | Status: DC | PRN
  Administered 2013-06-17: 1 mg via INTRAVENOUS
  Administered 2013-06-17 – 2013-06-18 (×2): 2 mg via INTRAVENOUS
  Administered 2013-06-18: 1 mg via INTRAVENOUS
  Administered 2013-06-19 (×2): 2 mg via INTRAVENOUS
  Filled 2013-06-17 (×6): qty 1

## 2013-06-17 MED ORDER — FLEET ENEMA 7-19 GM/118ML RE ENEM: 1.0000 | ENEMA | Freq: Once | RECTAL | Status: AC | PRN

## 2013-06-17 MED ORDER — STERILE WATER FOR IRRIGATION IR SOLN
Status: DC | PRN
  Administered 2013-06-17: 2000 mL

## 2013-06-17 MED ORDER — METHOCARBAMOL 500 MG PO TABS
500.0000 mg | ORAL_TABLET | Freq: Four times a day (QID) | ORAL | Status: DC | PRN
  Administered 2013-06-18 – 2013-06-19 (×4): 500 mg via ORAL
  Filled 2013-06-17 (×4): qty 1

## 2013-06-17 MED ORDER — MECLIZINE HCL 25 MG PO TABS
25.0000 mg | ORAL_TABLET | Freq: Two times a day (BID) | ORAL | Status: DC | PRN
  Filled 2013-06-17: qty 1

## 2013-06-17 MED ORDER — SODIUM CHLORIDE 0.9 % IJ SOLN
INTRAMUSCULAR | Status: DC | PRN
  Administered 2013-06-17: 12:00:00

## 2013-06-17 MED ORDER — SODIUM CHLORIDE 0.9 % IR SOLN
Status: DC | PRN
  Administered 2013-06-17: 1000 mL

## 2013-06-17 MED ORDER — POTASSIUM CITRATE ER 10 MEQ (1080 MG) PO TBCR
10.0000 meq | EXTENDED_RELEASE_TABLET | Freq: Two times a day (BID) | ORAL | Status: DC
  Administered 2013-06-17 – 2013-06-19 (×4): 10 meq via ORAL
  Filled 2013-06-17 (×5): qty 1

## 2013-06-17 MED ORDER — BUPIVACAINE HCL 0.25 % IJ SOLN
INTRAMUSCULAR | Status: DC | PRN
  Administered 2013-06-17: 20 mL

## 2013-06-17 MED ORDER — DEXTROSE 5 % IV SOLN
500.0000 mg | Freq: Four times a day (QID) | INTRAVENOUS | Status: DC | PRN
  Administered 2013-06-17 (×2): 500 mg via INTRAVENOUS
  Filled 2013-06-17 (×3): qty 5

## 2013-06-17 MED ORDER — BUPIVACAINE LIPOSOME 1.3 % IJ SUSP
20.0000 mL | Freq: Once | INTRAMUSCULAR | Status: DC
  Filled 2013-06-17: qty 20

## 2013-06-17 MED ORDER — ONDANSETRON HCL 4 MG/2ML IJ SOLN
INTRAMUSCULAR | Status: DC | PRN
  Administered 2013-06-17: 4 mg via INTRAVENOUS

## 2013-06-17 MED ORDER — DEXAMETHASONE SODIUM PHOSPHATE 10 MG/ML IJ SOLN
10.0000 mg | Freq: Every day | INTRAMUSCULAR | Status: AC
  Filled 2013-06-17: qty 1

## 2013-06-17 MED ORDER — ENOXAPARIN SODIUM 30 MG/0.3ML ~~LOC~~ SOLN
30.0000 mg | Freq: Two times a day (BID) | SUBCUTANEOUS | Status: DC
  Filled 2013-06-17 (×3): qty 0.3

## 2013-06-17 MED ORDER — EPHEDRINE SULFATE 50 MG/ML IJ SOLN
INTRAMUSCULAR | Status: DC | PRN
  Administered 2013-06-17 (×2): 5 mg via INTRAVENOUS

## 2013-06-17 MED ORDER — BUPIVACAINE HCL (PF) 0.75 % IJ SOLN
INTRAMUSCULAR | Status: DC | PRN
  Administered 2013-06-17: 15 mg

## 2013-06-17 MED ORDER — 0.9 % SODIUM CHLORIDE (POUR BTL) OPTIME
TOPICAL | Status: DC | PRN
  Administered 2013-06-17: 1000 mL

## 2013-06-17 MED ORDER — METOCLOPRAMIDE HCL 10 MG PO TABS: 5.0000 mg | ORAL_TABLET | Freq: Three times a day (TID) | ORAL | Status: DC | PRN

## 2013-06-17 MED ORDER — ALBUTEROL SULFATE HFA 108 (90 BASE) MCG/ACT IN AERS
2.0000 | INHALATION_SPRAY | Freq: Four times a day (QID) | RESPIRATORY_TRACT | Status: DC | PRN
  Filled 2013-06-17: qty 6.7

## 2013-06-17 MED ORDER — TRIAZOLAM 0.125 MG PO TABS: 0.2500 mg | ORAL_TABLET | Freq: Every evening | ORAL | Status: DC | PRN

## 2013-06-17 MED ORDER — TRIAMTERENE-HCTZ 37.5-25 MG PO TABS
1.0000 | ORAL_TABLET | Freq: Every day | ORAL | Status: DC
  Administered 2013-06-17 – 2013-06-19 (×3): 1 via ORAL
  Filled 2013-06-17 (×3): qty 1

## 2013-06-17 MED ORDER — FLUTICASONE PROPIONATE 50 MCG/ACT NA SUSP
2.0000 | Freq: Every day | NASAL | Status: DC | PRN
  Filled 2013-06-17: qty 16

## 2013-06-17 MED ORDER — PROPOFOL INFUSION 10 MG/ML OPTIME
INTRAVENOUS | Status: DC | PRN
  Administered 2013-06-17: 60 ug/kg/min via INTRAVENOUS

## 2013-06-17 MED ORDER — CEFAZOLIN SODIUM-DEXTROSE 2-3 GM-% IV SOLR
INTRAVENOUS | Status: AC
  Filled 2013-06-17: qty 50

## 2013-06-17 MED ORDER — KCL IN DEXTROSE-NACL 20-5-0.45 MEQ/L-%-% IV SOLN
INTRAVENOUS | Status: AC
  Filled 2013-06-17: qty 1000

## 2013-06-17 MED ORDER — HYDROMORPHONE HCL PF 1 MG/ML IJ SOLN: 0.2500 mg | INTRAMUSCULAR | Status: DC | PRN

## 2013-06-17 MED ORDER — BUPIVACAINE IN DEXTROSE 0.75-8.25 % IT SOLN
INTRATHECAL | Status: DC | PRN
  Administered 2013-06-17: 15 mg via INTRATHECAL

## 2013-06-17 MED ORDER — DIPHENHYDRAMINE HCL 12.5 MG/5ML PO ELIX: 12.5000 mg | ORAL_SOLUTION | ORAL | Status: DC | PRN

## 2013-06-17 MED ORDER — OXYCODONE HCL 5 MG PO TABS
5.0000 mg | ORAL_TABLET | ORAL | Status: DC | PRN
  Administered 2013-06-17 – 2013-06-19 (×8): 10 mg via ORAL
  Filled 2013-06-17 (×8): qty 2

## 2013-06-17 MED ORDER — BUPIVACAINE HCL (PF) 0.25 % IJ SOLN
INTRAMUSCULAR | Status: AC
  Filled 2013-06-17: qty 30

## 2013-06-17 MED ORDER — HYOSCYAMINE SULFATE 0.125 MG PO TABS
0.1250 mg | ORAL_TABLET | ORAL | Status: DC | PRN
  Filled 2013-06-17: qty 1

## 2013-06-17 MED ORDER — POLYETHYLENE GLYCOL 3350 17 G PO PACK: 17.0000 g | PACK | Freq: Every day | ORAL | Status: DC | PRN

## 2013-06-17 MED ORDER — SODIUM CHLORIDE 0.9 % IV SOLN: INTRAVENOUS | Status: DC

## 2013-06-17 MED ORDER — MENTHOL 3 MG MT LOZG: 1.0000 | LOZENGE | OROMUCOSAL | Status: DC | PRN

## 2013-06-17 MED ORDER — METOCLOPRAMIDE HCL 5 MG/ML IJ SOLN: 5.0000 mg | Freq: Three times a day (TID) | INTRAMUSCULAR | Status: DC | PRN

## 2013-06-17 MED ORDER — KCL IN DEXTROSE-NACL 10-5-0.45 MEQ/L-%-% IV SOLN
INTRAVENOUS | Status: AC
  Filled 2013-06-17: qty 1000

## 2013-06-17 MED ORDER — SODIUM CHLORIDE 0.9 % IJ SOLN
INTRAMUSCULAR | Status: AC
  Filled 2013-06-17: qty 50

## 2013-06-17 MED ORDER — PHENOL 1.4 % MT LIQD: 1.0000 | OROMUCOSAL | Status: DC | PRN

## 2013-06-17 MED ORDER — CEFAZOLIN SODIUM-DEXTROSE 2-3 GM-% IV SOLR
2.0000 g | INTRAVENOUS | Status: AC
  Administered 2013-06-17: 2 g via INTRAVENOUS

## 2013-06-17 SURGICAL SUPPLY — 57 items
BAG SPEC THK2 15X12 ZIP CLS (MISCELLANEOUS) ×1
BAG ZIPLOCK 12X15 (MISCELLANEOUS) ×2 IMPLANT
BANDAGE ELASTIC 6 VELCRO ST LF (GAUZE/BANDAGES/DRESSINGS) ×2 IMPLANT
BANDAGE ESMARK 6X9 LF (GAUZE/BANDAGES/DRESSINGS) ×1 IMPLANT
BLADE SAG 18X100X1.27 (BLADE) ×2 IMPLANT
BLADE SAW SGTL 11.0X1.19X90.0M (BLADE) ×2 IMPLANT
BNDG CMPR 9X6 STRL LF SNTH (GAUZE/BANDAGES/DRESSINGS) ×1
BNDG ESMARK 6X9 LF (GAUZE/BANDAGES/DRESSINGS) ×2
BOWL SMART MIX CTS (DISPOSABLE) ×2 IMPLANT
CAPT RP KNEE ×1 IMPLANT
CEMENT HV SMART SET (Cement) ×4 IMPLANT
CLOTH BEACON ORANGE TIMEOUT ST (SAFETY) ×2 IMPLANT
CUFF TOURN SGL QUICK 34 (TOURNIQUET CUFF) ×2
CUFF TRNQT CYL 34X4X40X1 (TOURNIQUET CUFF) ×1 IMPLANT
DECANTER SPIKE VIAL GLASS SM (MISCELLANEOUS) ×2 IMPLANT
DRAPE EXTREMITY T 121X128X90 (DRAPE) ×2 IMPLANT
DRAPE POUCH INSTRU U-SHP 10X18 (DRAPES) ×2 IMPLANT
DRAPE U-SHAPE 47X51 STRL (DRAPES) ×2 IMPLANT
DRSG ADAPTIC 3X8 NADH LF (GAUZE/BANDAGES/DRESSINGS) ×2 IMPLANT
DRSG PAD ABDOMINAL 8X10 ST (GAUZE/BANDAGES/DRESSINGS) ×2 IMPLANT
DURAPREP 26ML APPLICATOR (WOUND CARE) ×2 IMPLANT
ELECT REM PT RETURN 9FT ADLT (ELECTROSURGICAL) ×2
ELECTRODE REM PT RTRN 9FT ADLT (ELECTROSURGICAL) ×1 IMPLANT
EVACUATOR 1/8 PVC DRAIN (DRAIN) ×2 IMPLANT
FACESHIELD LNG OPTICON STERILE (SAFETY) ×10 IMPLANT
GLOVE BIO SURGEON STRL SZ7.5 (GLOVE) IMPLANT
GLOVE BIO SURGEON STRL SZ8 (GLOVE) ×2 IMPLANT
GLOVE BIOGEL PI IND STRL 8 (GLOVE) ×2 IMPLANT
GLOVE BIOGEL PI INDICATOR 8 (GLOVE) ×2
GLOVE SURG SS PI 6.5 STRL IVOR (GLOVE) IMPLANT
GOWN PREVENTION PLUS LG XLONG (DISPOSABLE) ×2 IMPLANT
GOWN STRL REIN XL XLG (GOWN DISPOSABLE) IMPLANT
HANDPIECE INTERPULSE COAX TIP (DISPOSABLE) ×2
IMMOBILIZER KNEE 20 (SOFTGOODS) ×2
IMMOBILIZER KNEE 20 THIGH 36 (SOFTGOODS) ×1 IMPLANT
KIT BASIN OR (CUSTOM PROCEDURE TRAY) ×2 IMPLANT
MANIFOLD NEPTUNE II (INSTRUMENTS) ×2 IMPLANT
NDL SAFETY ECLIPSE 18X1.5 (NEEDLE) ×2 IMPLANT
NEEDLE HYPO 18GX1.5 SHARP (NEEDLE) ×4
NS IRRIG 1000ML POUR BTL (IV SOLUTION) ×2 IMPLANT
PACK TOTAL JOINT (CUSTOM PROCEDURE TRAY) ×2 IMPLANT
PADDING CAST COTTON 6X4 STRL (CAST SUPPLIES) ×5 IMPLANT
POSITIONER SURGICAL ARM (MISCELLANEOUS) ×2 IMPLANT
SET HNDPC FAN SPRY TIP SCT (DISPOSABLE) ×1 IMPLANT
SPONGE GAUZE 4X4 12PLY (GAUZE/BANDAGES/DRESSINGS) ×2 IMPLANT
STRIP CLOSURE SKIN 1/2X4 (GAUZE/BANDAGES/DRESSINGS) ×4 IMPLANT
SUCTION FRAZIER 12FR DISP (SUCTIONS) ×2 IMPLANT
SUT MNCRL AB 4-0 PS2 18 (SUTURE) ×2 IMPLANT
SUT VIC AB 2-0 CT1 27 (SUTURE) ×6
SUT VIC AB 2-0 CT1 TAPERPNT 27 (SUTURE) ×3 IMPLANT
SUT VLOC 180 0 24IN GS25 (SUTURE) ×2 IMPLANT
SYR 20CC LL (SYRINGE) ×2 IMPLANT
SYR 50ML LL SCALE MARK (SYRINGE) ×2 IMPLANT
TOWEL OR 17X26 10 PK STRL BLUE (TOWEL DISPOSABLE) ×4 IMPLANT
TRAY FOLEY CATH 14FRSI W/METER (CATHETERS) ×2 IMPLANT
WATER STERILE IRR 1500ML POUR (IV SOLUTION) ×2 IMPLANT
WRAP KNEE MAXI GEL POST OP (GAUZE/BANDAGES/DRESSINGS) ×2 IMPLANT

## 2013-06-17 NOTE — Anesthesia Procedure Notes (Signed)
Spinal  Patient location during procedure: OR Start time: 06/17/2013 10:45 AM End time: 06/17/2013 10:55 AM Staffing Anesthesiologist: Ronelle Nigh L Performed by: anesthesiologist  Preanesthetic Checklist Completed: patient identified, site marked, surgical consent, pre-op evaluation, timeout performed, IV checked, risks and benefits discussed and monitors and equipment checked Spinal Block Patient position: sitting Prep: Betadine Patient monitoring: heart rate, continuous pulse ox and blood pressure Approach: midline Location: L3-4 Injection technique: single-shot Needle Needle type: Spinocan  Needle gauge: 22 G Needle length: 9 cm Assessment Sensory level: T6 Additional Notes Expiration date of kit checked and confirmed. Patient tolerated procedure well, without complications.

## 2013-06-17 NOTE — Anesthesia Preprocedure Evaluation (Addendum)
Anesthesia Evaluation  Patient identified by MRN, date of birth, ID band Patient awake    Reviewed: Allergy & Precautions, H&P , NPO status , Patient's Chart, lab work & pertinent test results  Airway Mallampati: II TM Distance: >3 FB Neck ROM: full    Dental no notable dental hx. (+) Teeth Intact and Dental Advisory Given   Pulmonary asthma ,  History PE 12/13 breath sounds clear to auscultation  Pulmonary exam normal       Cardiovascular hypertension, Pt. on medications Rhythm:regular Rate:Normal     Neuro/Psych negative neurological ROS  negative psych ROS   GI/Hepatic negative GI ROS, Neg liver ROS, GERD-  Medicated and Controlled,  Endo/Other  negative endocrine ROSHypothyroidism   Renal/GU negative Renal ROS  negative genitourinary   Musculoskeletal   Abdominal   Peds  Hematology negative hematology ROS (+)   Anesthesia Other Findings   Reproductive/Obstetrics negative OB ROS                          Anesthesia Physical Anesthesia Plan  ASA: III  Anesthesia Plan: Spinal   Post-op Pain Management:    Induction:   Airway Management Planned: Simple Face Mask  Additional Equipment:   Intra-op Plan:   Post-operative Plan:   Informed Consent: I have reviewed the patients History and Physical, chart, labs and discussed the procedure including the risks, benefits and alternatives for the proposed anesthesia with the patient or authorized representative who has indicated his/her understanding and acceptance.   Dental Advisory Given  Plan Discussed with: CRNA and Surgeon  Anesthesia Plan Comments:        Anesthesia Quick Evaluation

## 2013-06-17 NOTE — Op Note (Signed)
Pre-operative diagnosis- Osteoarthritis  Right knee(s)  Post-operative diagnosis- Osteoarthritis Right knee(s)  Procedure-  Right  Total Knee Arthroplasty  Surgeon- [name]. Lillia Lengel, MD  Assistant- Avel Peace, PA-C   Anesthesia-  Spinal EBL-* No blood loss amount entered *  Drains Hemovac  Tourniquet time-  Total Tourniquet Time Documented: Thigh (Right) - 32 minutes Total: Thigh (Right) - 32 minutes    Complications- None  Condition-PACU - hemodynamically stable.   Brief Clinical Note  [name] is a 67 y.o. year old male with end stage OA of his right knee with progressively worsening pain and dysfunction. He has constant pain, with activity and at rest and significant functional deficits with difficulties even with ADLs. He has had extensive non-op management including analgesics, injections of cortisone, and home exercise program, but remains in significant pain with significant dysfunction. Radiographs show bone on bone arthritis medial and patellofemoral. He presents now for right Total Knee Arthroplasty.    Procedure in detail---   The patient is brought into the operating room and positioned supine on the operating table. After successful administration of  Spinal,   a tourniquet is placed high on the  Right thigh(s) and the lower extremity is prepped and draped in the usual sterile fashion. Time out is performed by the operating team and then the  Right lower extremity is wrapped in Esmarch, knee flexed and the tourniquet inflated to 300 mmHg.       A midline incision is made with a ten blade through the subcutaneous tissue to the level of the extensor mechanism. A fresh blade is used to make a medial parapatellar arthrotomy. Soft tissue over the proximal medial tibia is subperiosteally elevated to the joint line with a knife and into the semimembranosus bursa with a Cobb elevator. Soft tissue over the proximal lateral tibia is elevated with attention being paid to  avoiding the patellar tendon on the tibial tubercle. The patella is everted, knee flexed 90 degrees and the ACL and PCL are removed. Findings are bone on bone medial and patellofemoral with large medial osteophytes.        The drill is used to create a starting hole in the distal femur and the canal is thoroughly irrigated with sterile saline to remove the fatty contents. The 5 degree Right  valgus alignment guide is placed into the femoral canal and the distal femoral cutting block is pinned to remove 10 mm off the distal femur. Resection is made with an oscillating saw.      The tibia is subluxed forward and the menisci are removed. The extramedullary alignment guide is placed referencing proximally at the medial aspect of the tibial tubercle and distally along the second metatarsal axis and tibial crest. The block is pinned to remove 2mm off the more deficient medial  side. Resection is made with an oscillating saw. Size 4is the most appropriate size for the tibia and the proximal tibia is prepared with the modular drill and keel punch for that size.      The femoral sizing guide is placed and size 4 is most appropriate. Rotation is marked off the epicondylar axis and confirmed by creating a rectangular flexion gap at 90 degrees. The size 4 cutting block is pinned in this rotation and the anterior, posterior and chamfer cuts are made with the oscillating saw. The intercondylar block is then placed and that cut is made.      Trial size 4 tibial component, trial size 4 posterior stabilized  femur and a 15  mm posterior stabilized rotating platform insert trial is placed. Full extension is achieved with excellent varus/valgus and anterior/posterior balance throughout full range of motion. The patella is everted and thickness measured to be 25  mm. Free hand resection is taken to 14 mm, a 41 template is placed, lug holes are drilled, trial patella is placed, and it tracks normally. Osteophytes are removed off the  posterior femur with the trial in place. All trials are removed and the cut bone surfaces prepared with pulsatile lavage. Cement is mixed and once ready for implantation, the size 4 tibial implant, size  4 posterior stabilized femoral component, and the size 41 patella are cemented in place and the patella is held with the clamp. The trial insert is placed and the knee held in full extension. The Exparel (20 ml mixed with 30 ml saline) and .25% Bupivicaine, are injected into the extensor mechanism, posterior capsule, medial and lateral gutters and subcutaneous tissues.  All extruded cement is removed and once the cement is hard the permanent 15 mm posterior stabilized rotating platform insert is placed into the tibial tray.      The wound is copiously irrigated with saline solution and the extensor mechanism closed over a hemovac drain with #1 PDS suture. The tourniquet is released for a total tourniquet time of 32  minutes. Flexion against gravity is 140 degrees and the patella tracks normally. Subcutaneous tissue is closed with 2.0 vicryl and subcuticular with running 4.0 Monocryl. The incision is cleaned and dried and steri-strips and a bulky sterile dressing are applied. The limb is placed into a knee immobilizer and the patient is awakened and transported to recovery in stable condition.      Please note that a surgical assistant was a medical necessity for this procedure in order to perform it in a safe and expeditious manner. Surgical assistant was necessary to retract the ligaments and vital neurovascular structures to prevent injury to them and also necessary for proper positioning of the limb to allow for anatomic placement of the prosthesis.   [name] [name], MD    06/17/2013, 11:53 AM

## 2013-06-17 NOTE — Interval H&P Note (Signed)
History and Physical Interval Note:  06/17/2013 10:08 AM  [name]  has presented today for surgery, with the diagnosis of Right Knee Osteoarthritis  The various methods of treatment have been discussed with the patient and family. After consideration of risks, benefits and other options for treatment, the patient has consented to  Procedure(s): RIGHT TOTAL KNEE ARTHROPLASTY (Right) as a surgical intervention .  The patient's history has been reviewed, patient examined, no change in status, stable for surgery.  I have reviewed the patient's chart and labs.  Questions were answered to the patient's satisfaction.     Loanne Drilling

## 2013-06-17 NOTE — Anesthesia Postprocedure Evaluation (Signed)
.    Anesthesia Post-op Note  Patient: [name]  Procedure(s) Performed: Procedure(s) (LRB): RIGHT TOTAL KNEE ARTHROPLASTY (Right)  Patient Location: PACU  Anesthesia Type: Spinal  Level of Consciousness: awake and alert   Airway and Oxygen Therapy: Patient Spontanous Breathing  Post-op Pain: mild  Post-op Assessment: Post-op Vital signs reviewed, Patient's Cardiovascular Status Stable, Respiratory Function Stable, Patent Airway and No signs of Nausea or vomiting  Last Vitals:  Filed Vitals:   06/17/13 1355  BP: 128/73  Pulse: 73  Temp: 36.4 C  Resp: 18    Post-op Vital Signs: stable   Complications: No apparent anesthesia complications

## 2013-06-17 NOTE — Evaluation (Signed)
Physical Therapy Evaluation Patient Details Name: [name] MRN: [id number] DOB: [birth year] Today's Date: 06/17/2013 Time: 1700-1735 PT Time Calculation (min): 35 min  PT Assessment / Plan / Recommendation History of Present Illness     Clinical Impression  Pt s/p R TKR presents with decreased R LE strength/ROM and post op pain limiting functional mobility.  Pt should progress well to d/c home with family assist and HHPT follow up.    PT Assessment  Patient needs continued PT services    Follow Up Recommendations  Home health PT    Does the patient have the potential to tolerate intense rehabilitation      Barriers to Discharge        Equipment Recommendations  None recommended by PT    Recommendations for Other Services OT consult   Frequency 7X/week    Precautions / Restrictions Precautions Precautions: Fall;Knee Restrictions Weight Bearing Restrictions: No Other Position/Activity Restrictions: WBAT   Pertinent Vitals/Pain 7/10; premed, RN aware, ice packs provided      Mobility  Bed Mobility Bed Mobility: Supine to Sit Supine to Sit: 4: Min assist;3: Mod assist Details for Bed Mobility Assistance: cues for sequence and use of L LE to self assist Transfers Transfers: Sit to Stand;Stand to Sit Sit to Stand: 3: Mod assist Stand to Sit: 4: Min assist;3: Mod assist Details for Transfer Assistance: cues for LE management and use of UEs to self assist Ambulation/Gait Ambulation/Gait Assistance: 3: Mod assist Ambulation Distance (Feet): 28 Feet Assistive device: Rolling walker Ambulation/Gait Assistance Details: cues for posture, sequence and position from RW Gait Pattern: Step-to pattern;Decreased step length - right;Decreased step length - left;Shuffle;Trunk flexed Stairs: No    Exercises Total Joint Exercises Ankle Circles/Pumps: AAROM;10 reps;Supine;Both Quad Sets: AROM;10 reps;Supine;Both Heel Slides: AAROM;Right;10 reps;Supine Straight Leg Raises:  AAROM;10 reps;Right;Supine   PT Diagnosis: Difficulty walking  PT Problem List: Decreased strength;Decreased range of motion;Decreased activity tolerance;Decreased mobility;Decreased knowledge of use of DME;Pain PT Treatment Interventions: DME instruction;Gait training;Stair training;Functional mobility training;Therapeutic activities;Therapeutic exercise;Patient/family education     PT Goals(Current goals can be found in the care plan section) Acute Rehab PT Goals Patient Stated Goal: Resume previous lifestyle with decreased pain PT Goal Formulation: With patient Time For Goal Achievement: 06/24/13 Potential to Achieve Goals: Good  Visit Information  Last PT Received On: 06/17/13 Assistance Needed: +1       Prior Functioning  Home Living Family/patient expects to be discharged to:: Private residence Living Arrangements: Spouse/significant other Available Help at Discharge: Family Type of Home: House Home Access: Stairs to enter Secretary/administrator of Steps: 2 Entrance Stairs-Rails: None Home Layout: Two level Alternate Level Stairs-Number of Steps: 12 Alternate Level Stairs-Rails: Right Home Equipment: Walker - 2 wheels Prior Function Level of Independence: Independent Communication Communication: No difficulties Dominant Hand: Right    Cognition  Cognition Arousal/Alertness: Awake/alert Behavior During Therapy: WFL for tasks assessed/performed Overall Cognitive Status: Within Functional Limits for tasks assessed    Extremity/Trunk Assessment Upper Extremity Assessment Upper Extremity Assessment: Overall WFL for tasks assessed Lower Extremity Assessment Lower Extremity Assessment: RLE deficits/detail RLE Deficits / Details: 2+/5 quads with AAROM at knee -10 - 60   Balance    End of Session PT - End of Session Equipment Utilized During Treatment: Gait belt;Right knee immobilizer Activity Tolerance: Patient tolerated treatment well Patient left: in chair;with  call bell/phone within reach Nurse Communication: Mobility status CPM Right Knee CPM Right Knee: On  GP     Kurk Corniel 06/17/2013, 5:44 PM

## 2013-06-17 NOTE — Progress Notes (Signed)
Utilization review completed.  

## 2013-06-17 NOTE — Transfer of Care (Signed)
Immediate Anesthesia Transfer of Care Note  Patient: [name]  Procedure(s) Performed: Procedure(s): RIGHT TOTAL KNEE ARTHROPLASTY (Right)  Patient Location: PACU  Anesthesia Type:Spinal  Level of Consciousness: sedated  Airway & Oxygen Therapy: Patient Spontanous Breathing and Patient connected to face mask oxygen  Post-op Assessment: Report given to PACU RN and Post -op Vital signs reviewed and stable  Post vital signs: Reviewed and stable  Complications: No apparent anesthesia complications

## 2013-06-17 NOTE — Progress Notes (Signed)
O.K. To go to floor with Spinal LEVEL OF L1 per Dr. Leta Jungling

## 2013-06-18 LAB — BASIC METABOLIC PANEL
CO2: 26 mEq/L (ref 19–32)
Chloride: 101 mEq/L (ref 96–112)
Creatinine, Ser: 1.44 mg/dL — ABNORMAL HIGH (ref 0.50–1.35)
Glucose, Bld: 148 mg/dL — ABNORMAL HIGH (ref 70–99)

## 2013-06-18 LAB — CBC
HCT: 39.3 % (ref 39.0–52.0)
Hemoglobin: 13.3 g/dL (ref 13.0–17.0)
MCH: 30.1 pg (ref 26.0–34.0)
MCV: 88.9 fL (ref 78.0–100.0)
Platelets: 168 10*3/uL (ref 150–400)
RBC: 4.42 MIL/uL (ref 4.22–5.81)
WBC: 15.6 10*3/uL — ABNORMAL HIGH (ref 4.0–10.5)

## 2013-06-18 MED ORDER — RIVAROXABAN 10 MG PO TABS
10.0000 mg | ORAL_TABLET | Freq: Every day | ORAL | Status: DC
  Administered 2013-06-18: 10 mg via ORAL
  Filled 2013-06-18 (×2): qty 1

## 2013-06-18 NOTE — Progress Notes (Signed)
Physical Therapy Treatment Patient Details Name: [name] MRN: [id number] DOB: [birth year] Today's Date: 06/18/2013 Time: 1352-1410 PT Time Calculation (min): 18 min  PT Assessment / Plan / Recommendation  History of Present Illness R TKR   PT Comments   POD # 1 pm session.  Assisted pt out of recliner to amb in hallway second time then back to bed for CPM.  Follow Up Recommendations  Home health PT     Does the patient have the potential to tolerate intense rehabilitation     Barriers to Discharge        Equipment Recommendations  None recommended by PT    Recommendations for Other Services    Frequency 7X/week   Progress towards PT Goals Progress towards PT goals: Progressing toward goals  Plan      Precautions / Restrictions Precautions Precautions: Fall;Knee Precaution Comments: Pt able to perform 10 active SLR Restrictions Weight Bearing Restrictions: No Other Position/Activity Restrictions: WBAT    Pertinent Vitals/Pain C/o 6/10 Muscle relax requested    Mobility  Bed Mobility Bed Mobility: Sit to Supine Supine to Sit: 4: Min guard Sit to Supine: 4: Min guard Details for Bed Mobility Assistance: assisted back to bed  Transfers Transfers: Sit to Stand;Stand to Sit Sit to Stand: 5: Supervision;4: Min guard;From chair/3-in-1 Stand to Sit: 5: Supervision;4: Min guard;To chair/3-in-1;To bed Details for Transfer Assistance: 25% VC's on proper tech and hand placement Ambulation/Gait Ambulation/Gait Assistance: 4: Min guard Ambulation Distance (Feet): 65 Feet Assistive device: Rolling walker Ambulation/Gait Assistance Details: decreased amb distance this afternoon 2nd increased c/o pain/fatigue Gait Pattern: Step-to pattern;Decreased step length - right;Decreased step length - left;Shuffle;Trunk flexed Gait velocity: 25% VC's to decrease gait speed for safety and to decrease dyspnea     PT Goals (current goals can now be found in the care plan  section) Acute Rehab PT Goals Patient Stated Goal: Resume previous lifestyle with decreased pain  Visit Information  Last PT Received On: 06/18/13 Assistance Needed: +1 History of Present Illness: R TKR    Subjective Data  Patient Stated Goal: Resume previous lifestyle with decreased pain   Cognition  Cognition Arousal/Alertness: Awake/alert Behavior During Therapy: WFL for tasks assessed/performed Overall Cognitive Status: Within Functional Limits for tasks assessed    Balance  Balance Balance Assessed: Yes Dynamic Standing Balance Dynamic Standing - Level of Assistance: 4: Min assist (min guard assist)  End of Session PT - End of Session Equipment Utilized During Treatment: Gait belt Activity Tolerance: Patient tolerated treatment well Patient left: in bed;with call bell/phone within reach   Felecia Shelling  PTA [facility]  Acute  Rehab Pager      [phone]

## 2013-06-18 NOTE — Evaluation (Signed)
Occupational Therapy Evaluation Patient Details Name: [name] MRN: [id number] DOB: [birth year] Today's Date: 06/18/2013 Time: 4098-1191 OT Time Calculation (min): 27 min  OT Assessment / Plan / Recommendation History of present illness R TKR   Clinical Impression   Pt overall doing well with ADL. He declined need to practice 3in1 transfer (states he went earlier and had no difficulty). He did agree to practice with AE as his wife will be unable to assist with LB ADL at d/c. Pt able to reach down to ankle but not all the way down to toes. He will consider purchasing AE kit.    OT Assessment  Patient does not need any further OT services    Follow Up Recommendations  No OT follow up;Supervision/Assistance - 24 hour    Barriers to Discharge      Equipment Recommendations  None recommended by OT    Recommendations for Other Services    Frequency       Precautions / Restrictions Precautions Precautions: Fall;Knee Restrictions Weight Bearing Restrictions: No Other Position/Activity Restrictions: WBAT   Pertinent Vitals/Pain 4-5/10; reposition, ice    ADL  Eating/Feeding: Simulated;Independent Where Assessed - Eating/Feeding: Chair Grooming: Simulated;Set up;Wash/dry hands Where Assessed - Grooming: Supported sitting Upper Body Bathing: Simulated;Chest;Right arm;Left arm;Abdomen;Set up Where Assessed - Upper Body Bathing: Unsupported sitting Lower Body Bathing: Simulated;Minimal assistance Where Assessed - Lower Body Bathing: Supported sit to stand Upper Body Dressing: Simulated;Set up Where Assessed - Upper Body Dressing: Unsupported sitting Lower Body Dressing: Simulated;Minimal assistance (without AE) Where Assessed - Lower Body Dressing: Supported sit to stand Toilet Transfer: Simulated;Min Pension scheme manager Method: Sit to stand Toileting - Architect and Hygiene: Simulated;Min guard Where Assessed - Engineer, mining and Hygiene:  Sit to stand from 3-in-1 or toilet Equipment Used: Long-handled shoe horn;Long-handled sponge;Reacher;Rolling walker;Sock aid ADL Comments: Pt states wife is unable to assist at d/c with LB ADL. He is interested in AE. Has all pieces except sock aid. Explained where to obtain sock aid. He may consider obtaining a new kit. He declined need to practice 3in1 transfer and states he went earlier this am and had no difficulty. He has a tub seat and discussed sit and turn method versus step over tub. He states his son can assist wtih showering. He declined need to practice tub transfer and states son will assist when he is ready to shower. Pt states no furher OT needs. Pt with some noted blood draining down from where drain was pulled. Nursing informed and came to room to dress area.    OT Diagnosis:    OT Problem List:   OT Treatment Interventions:     OT Goals(Current goals can be found in the care plan section) Acute Rehab OT Goals Patient Stated Goal: Resume previous lifestyle with decreased pain  Visit Information  Last OT Received On: 06/18/13 Assistance Needed: +1 History of Present Illness: R TKR       Prior Functioning     Home Living Family/patient expects to be discharged to:: Private residence Living Arrangements: Spouse/significant other Available Help at Discharge: Family Type of Home: House Home Access: Stairs to enter Secretary/administrator of Steps: 2 Entrance Stairs-Rails: None Home Layout: Two level Alternate Level Stairs-Number of Steps: 12 Alternate Level Stairs-Rails: Right Home Equipment: Walker - 2 wheels;Bedside commode;Shower seat;Adaptive equipment Adaptive Equipment: Reacher;Long-handled shoe horn;Long-handled sponge Prior Function Level of Independence: Independent Comments: states wife is unable to physically assist him or bend down to help with LB ADL  Communication Communication: No difficulties Dominant Hand: Right         Vision/Perception      Cognition  Cognition Arousal/Alertness: Awake/alert Behavior During Therapy: WFL for tasks assessed/performed Overall Cognitive Status: Within Functional Limits for tasks assessed    Extremity/Trunk Assessment Upper Extremity Assessment Upper Extremity Assessment: Overall WFL for tasks assessed     Mobility Transfers Transfers: Sit to Stand;Stand to Sit Sit to Stand: 4: Min guard;With upper extremity assist;From chair/3-in-1 Stand to Sit: 4: Min guard;With upper extremity assist;To chair/3-in-1 Details for Transfer Assistance: verbal cues for LE management and hand placement     Exercise     Balance Balance Balance Assessed: Yes Dynamic Standing Balance Dynamic Standing - Level of Assistance: 4: Min assist (min guard assist)   End of Session OT - End of Session Activity Tolerance: Patient tolerated treatment well Patient left: in chair;with call bell/phone within reach  GO     Lennox Laity  [phone] 06/18/2013, 12:19 PM

## 2013-06-18 NOTE — Progress Notes (Signed)
Pt refusing lovenox due to it caused pain in legs last time. Pt wants to be on Warfarin. Sticky note written for MD. Lanier Felty, [employer]

## 2013-06-18 NOTE — Progress Notes (Signed)
   CARE MANAGEMENT NOTE 06/18/2013  Patient:  [name], [name]   Account Number:  [id number]  Date Initiated:  06/17/2013  Documentation initiated by:  [facility]  Subjective/Objective Assessment:   RIGHT TOTAL KNEE ARTHROPLASTY (Right)     Action/Plan:   waiting PT/OT recommendation   Anticipated DC Date:  06/19/2013   Anticipated DC Plan:  HOME W HOME HEALTH SERVICES      DC Planning Services  CM consult  Medication Assistance      Choice offered to / List presented to:             Status of service:  In process, will continue to follow Medicare Important Message given?   (If response is "NO", the following Medicare IM given date fields will be blank) Date Medicare IM given:   Date Additional Medicare IM given:    Discharge Disposition:    Per UR Regulation:    If discussed at Long Length of Stay Meetings, dates discussed:    Comments:  06/18/2013 1308 NCM spoke to pt and states he getting ready to hit the donut hole and paying for meds have been expensive. Provided pt with a 30 day free trial card for Xarelto. Pt instructed to call to activate card and take with him to his pharmacy with Rx. NCM will continue to follow for dc needs. Isidoro Donning RN CCM Case Mgmt phone [phone]

## 2013-06-18 NOTE — Progress Notes (Signed)
   CARE MANAGEMENT NOTE 06/18/2013  Patient:  [name], [name]   Account Number:  [id number]  Date Initiated:  06/17/2013  Documentation initiated by:  [facility]  Subjective/Objective Assessment:   RIGHT TOTAL KNEE ARTHROPLASTY (Right)     Action/Plan:   waiting PT/OT recommendation   Anticipated DC Date:  06/19/2013   Anticipated DC Plan:  HOME W HOME HEALTH SERVICES      DC Planning Services  CM consult  Medication Assistance      Choice offered to / List presented to:  C-1 Patient        HH arranged  HH-2 PT      [facility] agency  [facility]   Status of service:  Completed, signed off Medicare Important Message given?   (If response is "NO", the following Medicare IM given date fields will be blank) Date Medicare IM given:   Date Additional Medicare IM given:    Discharge Disposition:  HOME W HOME HEALTH SERVICES  Per UR Regulation:    If discussed at Long Length of Stay Meetings, dates discussed:    Comments:  06/18/2013 1308 NCM spoke to pt and states he getting ready to hit the donut hole and paying for meds have been expensive. Provided pt with a 30 day free trial card for Xarelto. Pt instructed to call to activate card and take with him to his pharmacy with Rx. NCM will continue to follow for dc needs. Isidoro Donning RN CCM Case Mgmt phone [phone]

## 2013-06-18 NOTE — Progress Notes (Signed)
Physical Therapy Treatment Patient Details Name: [name] MRN: [id number] DOB: 27-Jun-1946 Today's Date: 06/18/2013 Time: 1000-1040 PT Time Calculation (min): 40 min  PT Assessment / Plan / Recommendation  History of Present Illness R TKR   PT Comments   POD # 1 am session.  Assisted OOB to amb to BR then in hallway.  Performed TKR TE's then applied ICE.  Pt plans to D/C to home.   Follow Up Recommendations  Home health PT     Does the patient have the potential to tolerate intense rehabilitation     Barriers to Discharge        Equipment Recommendations  None recommended by PT    Recommendations for Other Services    Frequency 7X/week   Progress towards PT Goals Progress towards PT goals: Progressing toward goals  Plan      Precautions / Restrictions Precautions Precautions: Fall;Knee Precaution Comments: Pt able to perform 10 active SLR Restrictions Weight Bearing Restrictions: No Other Position/Activity Restrictions: WBAT    Pertinent Vitals/Pain C/o 4/10 ICE applied    Mobility  Bed Mobility Bed Mobility: Supine to Sit Supine to Sit: 4: Min guard Details for Bed Mobility Assistance: cues for sequence and use of L LE to self assist plus increased time Transfers Transfers: Sit to Stand;Stand to Sit Sit to Stand: 5: Supervision;4: Min guard;From bed Stand to Sit: 5: Supervision;4: Min guard;To chair/3-in-1 Details for Transfer Assistance: 25% VC's on proper tech and hand placement Ambulation/Gait Ambulation/Gait Assistance: 4: Min guard;4: Min assist Ambulation Distance (Feet): 85 Feet Assistive device: Rolling walker Ambulation/Gait Assistance Details: 25% VC's on proper walker to self distance and safety with turns. Gait Pattern: Step-to pattern;Decreased step length - right;Decreased step length - left;Shuffle;Trunk flexed Gait velocity: 25% VC's to decrease gait speed for safety and to decrease dyspnea    Exercises   Total Knee Replacement TE's 10  reps B LE ankle pumps 10 reps knee presses 10 reps heel slides  10 reps SAQ's 10 reps SLR's 10 reps ABD Followed by ICE    PT Goals (current goals can now be found in the care plan section) Acute Rehab PT Goals Patient Stated Goal: Resume previous lifestyle with decreased pain  Visit Information  Last PT Received On: 06/18/13 Assistance Needed: +1 History of Present Illness: R TKR    Subjective Data  Patient Stated Goal: Resume previous lifestyle with decreased pain   Cognition  Cognition Arousal/Alertness: Awake/alert Behavior During Therapy: WFL for tasks assessed/performed Overall Cognitive Status: Within Functional Limits for tasks assessed    Balance  Balance Balance Assessed: Yes Dynamic Standing Balance Dynamic Standing - Level of Assistance: 4: Min assist (min guard assist)  End of Session PT - End of Session Equipment Utilized During Treatment: Gait belt Activity Tolerance: Patient tolerated treatment well Patient left: in chair;with call bell/phone within reach   Felecia Shelling  PTA [facility]  Acute  Rehab Pager      [phone]

## 2013-06-18 NOTE — Progress Notes (Signed)
Subjective: Doing well.  Pain controlled.  Patient refusing lovenox injections postop as prescribed for dvt prophylaxis.  States that he has a previous hx of left le DVT and PE after hand surgery with dr Merlyn Lot.  Used lovenox bridge and states that this caused right leg pain and therefore does not want to use it now.  No current complaints of cp, sob.    Objective: Vital signs in last 24 hours: Temp:  [97.3 F (36.3 C)-98.3 F (36.8 C)] 98.2 F (36.8 C) (09/13 0530) Pulse Rate:  [61-92] 77 (09/13 0530) Resp:  [12-18] 16 (09/13 0530) BP: (108-144)/(61-81) 127/75 mmHg (09/13 0530) SpO2:  [97 %-100 %] 99 % (09/13 0530) Weight:  [93.622 kg (206 lb 6.4 oz)] 93.622 kg (206 lb 6.4 oz) (09/12 1355)  Intake/Output from previous day: 09/12 0701 - 09/13 0700 In: 4281.7 [P.O.:480; I.V.:3696.7; IV Piggyback:105] Out: 2610 [Urine:2225; Drains:375; Blood:10] Intake/Output this shift: Total I/O In: 240 [P.O.:240] Out: -    Recent Labs  06/18/13 0409  HGB 13.3    Recent Labs  06/18/13 0409  WBC 15.6*  RBC 4.42  HCT 39.3  PLT 168    Recent Labs  06/18/13 0409  NA 134*  K 3.8  CL 101  CO2 26  BUN 20  CREATININE 1.44*  GLUCOSE 148*  CALCIUM 9.0   No results found for this basename: LABPT, INR,  in the last 72 hours  Exam:  Knee wound looks good.  No drainage or signs of infection.  Calf nt, nvi.    Assessment/Plan: Spoke with dr Lequita Halt.  Ok to use xarelto.  Will get pharmacy consult and start today.  Plan d/c home tomorrow.  Dressing change right knee.    Tobin Witucki M 06/18/2013, 11:13 AM

## 2013-06-18 NOTE — Progress Notes (Signed)
ANTICOAGULATION CONSULT NOTE - Initial Consult  Pharmacy Consult for rivaroxaban Indication: VTE prophylaxis  Allergies  Allergen Reactions  . Caffeine     Urinary retention  . Codeine     "feel jittery"  . Pseudoephedrine     Urinary retention  . Simvastatin Other (See Comments)    Hurt my bones    Patient Measurements: Height: 5' 11.5" (181.6 cm) Weight: 206 lb 6.4 oz (93.622 kg) IBW/kg (Calculated) : 76.45 Heparin Dosing Weight:   Vital Signs: Temp: 98.2 F (36.8 C) (09/13 0530) Temp src: Oral (09/13 0530) BP: 127/75 mmHg (09/13 0530) Pulse Rate: 77 (09/13 0530)  Labs:  Recent Labs  06/18/13 0409  HGB 13.3  HCT 39.3  PLT 168  CREATININE 1.44*    Estimated Creatinine Clearance: 57 ml/min (by C-G formula based on Cr of 1.44).   Medical History: Past Medical History  Diagnosis Date  . Arthritis   . Asthma   . GERD (gastroesophageal reflux disease)   . Hyperlipidemia   . BPH (benign prostatic hyperplasia)   . Seasonal allergies   . IBS (irritable bowel syndrome)   . Pulmonary embolus 09/06/12    HOSP AT [facility] AND PLACED ON COUMADIN  . Urinary retention     HX OF KIDNEY STONES-BILATERAL, HX BPH  -- PT TOLD HIS KIDNEY FUNCTIONS  ELVATED WHILE HOSP DEC 2013 FOR PULMONARY EMBOLUS  . Complication of anesthesia     PROBLEMS VOIDING AFTER HERNIA REPAIR  . Hypertension     Assessment: 58 YOM s/p R TKA, he has h/o of DVT and PE 09/2012 and appears to have completed warfarin therapy for this .  Orders for [place] to dose xarelto for VTE prophylaxis, initially started on enoxparain but he is unable to tolerate this d/t pain/numbness in leg.    Hgb = 13.3 (baseline = 16.4)  Baseline INR = 0.87  Goal of Therapy:  Monitor platelets by anticoagulation protocol: Yes   Plan:   Xarelto 10mg  daily for VTE prophylaxis  D/C SQ enoxaparin  Monitor for bleeding post-op  Will provide xarelto education  Juliette Alcide, PharmD, BCPS.   Pager: [phone]  06/18/2013,11:35 AM

## 2013-06-19 LAB — BASIC METABOLIC PANEL
BUN: 21 mg/dL (ref 6–23)
CO2: 28 mEq/L (ref 19–32)
Chloride: 101 mEq/L (ref 96–112)
Creatinine, Ser: 1.31 mg/dL (ref 0.50–1.35)
Glucose, Bld: 125 mg/dL — ABNORMAL HIGH (ref 70–99)

## 2013-06-19 LAB — CBC
HCT: 39 % (ref 39.0–52.0)
Hemoglobin: 13.4 g/dL (ref 13.0–17.0)
MCV: 89 fL (ref 78.0–100.0)
Platelets: 178 10*3/uL (ref 150–400)
RBC: 4.38 MIL/uL (ref 4.22–5.81)
WBC: 16.5 10*3/uL — ABNORMAL HIGH (ref 4.0–10.5)

## 2013-06-19 MED ORDER — METHOCARBAMOL 500 MG PO TABS: 500.0000 mg | ORAL_TABLET | Freq: Four times a day (QID) | ORAL | Status: DC | PRN

## 2013-06-19 MED ORDER — RIVAROXABAN 20 MG PO TABS
20.0000 mg | ORAL_TABLET | Freq: Every day | ORAL | Status: DC
  Administered 2013-06-19: 20 mg via ORAL
  Filled 2013-06-19 (×2): qty 1

## 2013-06-19 MED ORDER — TRAMADOL HCL 50 MG PO TABS: 50.0000 mg | ORAL_TABLET | Freq: Four times a day (QID) | ORAL | Status: DC | PRN

## 2013-06-19 MED ORDER — RIVAROXABAN 20 MG PO TABS: 20.0000 mg | ORAL_TABLET | Freq: Every day | ORAL | Status: DC

## 2013-06-19 MED ORDER — OXYCODONE HCL 5 MG PO TABS: 5.0000 mg | ORAL_TABLET | ORAL | Status: DC | PRN

## 2013-06-19 NOTE — Progress Notes (Signed)
   Subjective: 2 Days Post-Op Procedure(s) (LRB): RIGHT TOTAL KNEE ARTHROPLASTY (Right) Patient reports pain as mild.   Patient seen in rounds with Dr. Lequita Halt. Patient is well, and has had no acute complaints or problems Plan is to go Home after hospital stay.  Objective: Vital signs in last 24 hours: Temp:  [98.1 F (36.7 C)-98.6 F (37 C)] 98.1 F (36.7 C) (09/14 0445) Pulse Rate:  [68-77] 68 (09/14 0445) Resp:  [16-20] 16 (09/14 0445) BP: (137-152)/(72-80) 137/72 mmHg (09/14 0445) SpO2:  [97 %-99 %] 97 % (09/14 0445)  Intake/Output from previous day:  Intake/Output Summary (Last 24 hours) at 06/19/13 0759 Last data filed at 06/19/13 0500  Gross per 24 hour  Intake 1436.67 ml  Output   1975 ml  Net -538.33 ml    Intake/Output this shift:    Labs:  Recent Labs  06/18/13 0409 06/19/13 0438  HGB 13.3 13.4    Recent Labs  06/18/13 0409 06/19/13 0438  WBC 15.6* 16.5*  RBC 4.42 4.38  HCT 39.3 39.0  PLT 168 178    Recent Labs  06/18/13 0409 06/19/13 0438  NA 134* 138  K 3.8 4.3  CL 101 101  CO2 26 28  BUN 20 21  CREATININE 1.44* 1.31  GLUCOSE 148* 125*  CALCIUM 9.0 9.4   No results found for this basename: LABPT, INR,  in the last 72 hours  EXAM General - Patient is Alert, Appropriate and Oriented Extremity - Neurovascular intact Sensation intact distally Dorsiflexion/Plantar flexion intact No cellulitis present Dressing/Incision - clean, dry, no drainage, healing Motor Function - intact, moving foot and toes well on exam.   Past Medical History  Diagnosis Date  . Arthritis   . Asthma   . GERD (gastroesophageal reflux disease)   . Hyperlipidemia   . BPH (benign prostatic hyperplasia)   . Seasonal allergies   . IBS (irritable bowel syndrome)   . Pulmonary embolus 09/06/12    HOSP AT [facility] AND PLACED ON COUMADIN  . Urinary retention     HX OF KIDNEY STONES-BILATERAL, HX BPH  -- PT TOLD HIS KIDNEY FUNCTIONS  ELVATED WHILE HOSP  DEC 2013 FOR PULMONARY EMBOLUS  . Complication of anesthesia     PROBLEMS VOIDING AFTER HERNIA REPAIR  . Hypertension     Assessment/Plan: 2 Days Post-Op Procedure(s) (LRB): RIGHT TOTAL KNEE ARTHROPLASTY (Right) Principal Problem:   OA (osteoarthritis) of knee  Estimated body mass index is 28.39 kg/(m^2) as calculated from the following:   Height as of this encounter: 5' 11.5" (1.816 m).   Weight as of this encounter: 93.622 kg (206 lb 6.4 oz). Up with therapy Discharge home with home health  DVT Prophylaxis - Xarelto 20 mg Weight-Bearing as tolerated to right leg DC home today F/U 2 weeks  [name] 06/19/2013, 7:59 AM

## 2013-06-19 NOTE — Progress Notes (Signed)
Discharged from floor via w/c, daughter with pt. No changes in assessment.  Shanyla Marconi  

## 2013-06-19 NOTE — Discharge Summary (Signed)
Physician Discharge Summary   Patient ID: [name] MRN: [id number] DOB/AGE: October 29, 1945 67 y.o.  Admit date: 06/17/2013 Discharge date: 06/19/2013  Primary Diagnosis: Osteoarthritis Right knee(s)  Admission Diagnoses:  Past Medical History  Diagnosis Date  . Arthritis   . Asthma   . GERD (gastroesophageal reflux disease)   . Hyperlipidemia   . BPH (benign prostatic hyperplasia)   . Seasonal allergies   . IBS (irritable bowel syndrome)   . Pulmonary embolus 09/06/12    HOSP AT [facility] AND PLACED ON COUMADIN  . Urinary retention     HX OF KIDNEY STONES-BILATERAL, HX BPH  -- PT TOLD HIS KIDNEY FUNCTIONS  ELVATED WHILE HOSP DEC 2013 FOR PULMONARY EMBOLUS  . Complication of anesthesia     PROBLEMS VOIDING AFTER HERNIA REPAIR  . Hypertension    Discharge Diagnoses:   Principal Problem:   OA (osteoarthritis) of knee  Estimated body mass index is 28.39 kg/(m^2) as calculated from the following:   Height as of this encounter: 5' 11.5" (1.816 m).   Weight as of this encounter: 93.622 kg (206 lb 6.4 oz).  Procedure:  Procedure(s) (LRB): RIGHT TOTAL KNEE ARTHROPLASTY (Right)   Consults: None  HPI: [name] is a 67 y.o. year old male with end stage OA of his right knee with progressively worsening pain and dysfunction. He has constant pain, with activity and at rest and significant functional deficits with difficulties even with ADLs. He has had extensive non-op management including analgesics, injections of cortisone, and home exercise program, but remains in significant pain with significant dysfunction. Radiographs show bone on bone arthritis medial and patellofemoral. He presents now for right Total Knee Arthroplasty.   Laboratory Data: Admission on 06/17/2013, Discharged on 06/19/2013  Component Date Value Range Status  . ABO/RH(D) 06/17/2013 O POS   Final  . Antibody Screen 06/17/2013 NEG   Final  . Sample Expiration 06/17/2013 06/20/2013   Final  .  ABO/RH(D) 06/17/2013 O POS   Final  . WBC 06/18/2013 15.6* 4.0 - 10.5 K/uL Final  . RBC 06/18/2013 4.42  4.22 - 5.81 MIL/uL Final  . Hemoglobin 06/18/2013 13.3  13.0 - 17.0 g/dL Final  . HCT 40/98/1191 39.3  39.0 - 52.0 % Final  . MCV 06/18/2013 88.9  78.0 - 100.0 fL Final  . MCH 06/18/2013 30.1  26.0 - 34.0 pg Final  . MCHC 06/18/2013 33.8  30.0 - 36.0 g/dL Final  . RDW 47/82/9562 13.4  11.5 - 15.5 % Final  . Platelets 06/18/2013 168  150 - 400 K/uL Final  . Sodium 06/18/2013 134* 135 - 145 mEq/L Final  . Potassium 06/18/2013 3.8  3.5 - 5.1 mEq/L Final  . Chloride 06/18/2013 101  96 - 112 mEq/L Final  . CO2 06/18/2013 26  19 - 32 mEq/L Final  . Glucose, Bld 06/18/2013 148* 70 - 99 mg/dL Final  . BUN 13/05/6577 20  6 - 23 mg/dL Final  . Creatinine, Ser 06/18/2013 1.44* 0.50 - 1.35 mg/dL Final  . Calcium 46/96/2952 9.0  8.4 - 10.5 mg/dL Final  . GFR calc non Af Amer 06/18/2013 48* >90 mL/min Final  . GFR calc Af Amer 06/18/2013 56* >90 mL/min Final   Comment: (NOTE)                          The eGFR has been calculated using the CKD EPI equation.  This calculation has not been validated in all clinical situations.                          eGFR's persistently <90 mL/min signify possible Chronic Kidney                          Disease.  . WBC 06/19/2013 16.5* 4.0 - 10.5 K/uL Final  . RBC 06/19/2013 4.38  4.22 - 5.81 MIL/uL Final  . Hemoglobin 06/19/2013 13.4  13.0 - 17.0 g/dL Final  . HCT 16/07/9603 39.0  39.0 - 52.0 % Final  . MCV 06/19/2013 89.0  78.0 - 100.0 fL Final  . MCH 06/19/2013 30.6  26.0 - 34.0 pg Final  . MCHC 06/19/2013 34.4  30.0 - 36.0 g/dL Final  . RDW 54/06/8118 13.5  11.5 - 15.5 % Final  . Platelets 06/19/2013 178  150 - 400 K/uL Final  . Sodium 06/19/2013 138  135 - 145 mEq/L Final  . Potassium 06/19/2013 4.3  3.5 - 5.1 mEq/L Final  . Chloride 06/19/2013 101  96 - 112 mEq/L Final  . CO2 06/19/2013 28  19 - 32 mEq/L Final  . Glucose, Bld  06/19/2013 125* 70 - 99 mg/dL Final  . BUN 14/78/2956 21  6 - 23 mg/dL Final  . Creatinine, Ser 06/19/2013 1.31  0.50 - 1.35 mg/dL Final  . Calcium 21/30/8657 9.4  8.4 - 10.5 mg/dL Final  . GFR calc non Af Amer 06/19/2013 54* >90 mL/min Final  . GFR calc Af Amer 06/19/2013 62* >90 mL/min Final   Comment: (NOTE)                          The eGFR has been calculated using the CKD EPI equation.                          This calculation has not been validated in all clinical situations.                          eGFR's persistently <90 mL/min signify possible Chronic Kidney                          Disease.  Hospital Outpatient Visit on 06/14/2013  Component Date Value Range Status  . MRSA, PCR 06/14/2013 NEGATIVE  NEGATIVE Final  . Staphylococcus aureus 06/14/2013 NEGATIVE  NEGATIVE Final   Comment:                                 The Xpert SA Assay (FDA                          approved for NASAL specimens                          in patients over 76 years of age),                          is one component of                          a comprehensive  surveillance                          program.  Test performance has                          been validated by [facility] for patients greater                          than or equal to 75 year old.                          It is not intended                          to diagnose infection nor to                          guide or monitor treatment.  Marland Kitchen aPTT 06/14/2013 29  24 - 37 seconds Final  . WBC 06/14/2013 7.5  4.0 - 10.5 K/uL Final  . RBC 06/14/2013 5.32  4.22 - 5.81 MIL/uL Final  . Hemoglobin 06/14/2013 16.4  13.0 - 17.0 g/dL Final  . HCT 57/84/6962 47.5  39.0 - 52.0 % Final  . MCV 06/14/2013 89.3  78.0 - 100.0 fL Final  . MCH 06/14/2013 30.8  26.0 - 34.0 pg Final  . MCHC 06/14/2013 34.5  30.0 - 36.0 g/dL Final  . RDW 95/28/4132 13.4  11.5 - 15.5 % Final  . Platelets 06/14/2013 206  150 - 400 K/uL Final  .  Sodium 06/14/2013 141  135 - 145 mEq/L Final  . Potassium 06/14/2013 3.7  3.5 - 5.1 mEq/L Final  . Chloride 06/14/2013 102  96 - 112 mEq/L Final  . CO2 06/14/2013 31  19 - 32 mEq/L Final  . Glucose, Bld 06/14/2013 99  70 - 99 mg/dL Final  . BUN 44/10/270 30* 6 - 23 mg/dL Final  . Creatinine, Ser 06/14/2013 1.72* 0.50 - 1.35 mg/dL Final  . Calcium 53/66/4403 9.9  8.4 - 10.5 mg/dL Final  . Total Protein 06/14/2013 6.6  6.0 - 8.3 g/dL Final  . Albumin 47/42/5956 3.7  3.5 - 5.2 g/dL Final  . AST 38/75/6433 21  0 - 37 U/L Final  . ALT 06/14/2013 20  0 - 53 U/L Final  . Alkaline Phosphatase 06/14/2013 76  39 - 117 U/L Final  . Total Bilirubin 06/14/2013 0.7  0.3 - 1.2 mg/dL Final  . GFR calc non Af Amer 06/14/2013 39* >90 mL/min Final  . GFR calc Af Amer 06/14/2013 45* >90 mL/min Final   Comment: (NOTE)                          The eGFR has been calculated using the CKD EPI equation.                          This calculation has not been validated in all clinical situations.                          eGFR's  persistently <90 mL/min signify possible Chronic Kidney                          Disease.  Marland Kitchen Prothrombin Time 06/14/2013 11.7  11.6 - 15.2 seconds Final  . INR 06/14/2013 0.87  0.00 - 1.49 Final  . Color, Urine 06/14/2013 YELLOW  YELLOW Final  . APPearance 06/14/2013 CLEAR  CLEAR Final  . Specific Gravity, Urine 06/14/2013 1.025  1.005 - 1.030 Final  . pH 06/14/2013 5.0  5.0 - 8.0 Final  . Glucose, UA 06/14/2013 NEGATIVE  NEGATIVE mg/dL Final  . Hgb urine dipstick 06/14/2013 SMALL* NEGATIVE Final  . Bilirubin Urine 06/14/2013 NEGATIVE  NEGATIVE Final  . Ketones, ur 06/14/2013 NEGATIVE  NEGATIVE mg/dL Final  . Protein, ur 16/07/9603 NEGATIVE  NEGATIVE mg/dL Final  . Urobilinogen, UA 06/14/2013 0.2  0.0 - 1.0 mg/dL Final  . Nitrite 54/06/8118 NEGATIVE  NEGATIVE Final  . Leukocytes, UA 06/14/2013 TRACE* NEGATIVE Final  . Squamous Epithelial / LPF 06/14/2013 RARE  RARE Final  . WBC, UA  06/14/2013 0-2  <3 WBC/hpf Final  . RBC / HPF 06/14/2013 3-6  <3 RBC/hpf Final  . Bacteria, UA 06/14/2013 RARE  RARE Final     X-Rays:No results found.  EKG: Orders placed during the hospital encounter of 06/30/12  . EKG 12-LEAD  . EKG 12-LEAD  . EKG 12-LEAD  . EKG 12-LEAD     Hospital Course: [name] is a 67 y.o. who was admitted to [facility]. They were brought to the operating room on 06/17/2013 and underwent Procedure(s): RIGHT TOTAL KNEE ARTHROPLASTY.  Patient tolerated the procedure well and was later transferred to the recovery room and then to the orthopaedic floor for postoperative care.  They were given PO and IV analgesics for pain control following their surgery.  They were given 24 hours of postoperative antibiotics of  Anti-infectives   Start     Dose/Rate Route Frequency Ordered Stop   06/17/13 1800  ceFAZolin (ANCEF) IVPB 1 g/50 mL premix     1 g 100 mL/hr over 30 Minutes Intravenous Every 6 hours 06/17/13 1407 06/18/13 0026   06/17/13 0830  ceFAZolin (ANCEF) IVPB 2 g/50 mL premix     2 g 100 mL/hr over 30 Minutes Intravenous On call to O.R. 06/17/13 1478 06/17/13 1045     and started on DVT prophylaxis in the form of Xarelto.   PT and OT were ordered for total joint protocol.  Discharge planning consulted to help with postop disposition and equipment needs.  Patient had a decent night on the evening of surgery.  They started to get up OOB with therapy on day one. Hemovac drain was pulled without difficulty.  Continued to work with therapy into day two.  Dressing was changed on day two and the incision was healing well.  Patient was seen in rounds and was ready to go home.   Discharge Medications: Prior to Admission medications   Medication Sig Start Date End Date Taking? Authorizing Provider  albuterol (PROVENTIL HFA;VENTOLIN HFA) 108 (90 BASE) MCG/ACT inhaler Inhale 2 puffs into the lungs. As needed for wheezing 02/12/11  Yes Damian Leavell., MD  allopurinol (ZYLOPRIM) 300 MG tablet Take 300 mg by mouth daily.   Yes Historical Provider, MD  doxazosin (CARDURA) 2 MG tablet Take 2 mg by mouth every morning.   Yes Historical Provider, MD  fluticasone (FLONASE) 50 MCG/ACT nasal spray Place 2 sprays  into the nose daily as needed. For allergies   Yes Historical Provider, MD  levocetirizine (XYZAL) 5 MG tablet Take 5 mg by mouth daily as needed. For allergies   Yes Historical Provider, MD  meclizine (ANTIVERT) 25 MG tablet Take 25 mg by mouth 2 (two) times daily as needed. Take mid afternoon and at bedtime to prevent dizziness   Yes Historical Provider, MD  omeprazole (PRILOSEC) 20 MG capsule Take 1 capsule (20 mg total) by mouth daily. 12/10/11  Yes Edwyna Perfect, MD  potassium citrate (UROCIT-K) 10 MEQ (1080 MG) SR tablet Take 10 mEq by mouth 2 (two) times daily.   Yes Historical Provider, MD  predniSONE (DELTASONE) 10 MG tablet Take 5-10 mg by mouth daily as needed. For gout flare up   Yes Historical Provider, MD  triamcinolone (KENALOG) 0.025 % cream Apply 1 application topically 2 (two) times daily as needed. For dry spots on hands   Yes Historical Provider, MD  triamterene-hydrochlorothiazide (MAXZIDE) 75-50 MG per tablet Take 0.5 tablets by mouth every morning.   Yes Historical Provider, MD  triazolam (HALCION) 0.25 MG tablet Take 0.25 mg by mouth at bedtime as needed (for sleep).   Yes Historical Provider, MD  hyoscyamine (LEVSIN, ANASPAZ) 0.125 MG tablet Take 0.125 mg by mouth every 4 (four) hours as needed for cramping.    Historical Provider, MD  methocarbamol (ROBAXIN) 500 MG tablet Take 1 tablet (500 mg total) by mouth every 6 (six) hours as needed. 06/19/13   Alexzandrew Perkins, PA-C  oxyCODONE (OXY IR/ROXICODONE) 5 MG immediate release tablet Take 1-2 tablets (5-10 mg total) by mouth every 3 (three) hours as needed. 06/19/13   Alexzandrew Julien Girt, PA-C  Rivaroxaban (XARELTO) 20 MG TABS tablet Take 1 tablet (20 mg total) by  mouth daily. Take Xarelto for three weeks, then discontinue Xarelto. Once the patient has completed the blood thinner regimen, then take a Baby 81 mg Aspirin daily for four more weeks. 06/19/13   Alexzandrew Julien Girt, PA-C  senna (SENOKOT) 8.6 MG tablet Take 2-4 tablets by mouth at bedtime as needed for constipation.     Historical Provider, MD  traMADol (ULTRAM) 50 MG tablet Take 1-2 tablets (50-100 mg total) by mouth every 6 (six) hours as needed (mild pain). 06/19/13   Alexzandrew Julien Girt, PA-C    Diet: Regular diet Activity:WBAT Follow-up:in 2 weeks Disposition - Home Discharged Condition: good       Discharge Orders   Future Orders Complete By Expires   Call MD / Call 911  As directed    Comments:     If you experience chest pain or shortness of breath, CALL 911 and be transported to the hospital emergency room.  If you develope a fever above 101 F, pus (white drainage) or increased drainage or redness at the wound, or calf pain, call your surgeon's office.   Change dressing  As directed    Comments:     Change dressing daily with sterile 4 x 4 inch gauze dressing and apply TED hose. Do not submerge the incision under water.   Constipation Prevention  As directed    Comments:     Drink plenty of fluids.  Prune juice may be helpful.  You may use a stool softener, such as Colace (over the counter) 100 mg twice a day.  Use MiraLax (over the counter) for constipation as needed.   Diet - low sodium heart healthy  As directed    Discharge instructions  As directed  Comments:     Pick up stool softner and laxative for home. Do not submerge incision under water. May shower. Continue to use ice for pain and swelling from surgery.  Take Xarelto for two and a half more weeks, then discontinue Xarelto. Once the patient has completed the blood thinner regimen, then take a Baby 81 mg Aspirin daily for four more weeks.   Do not put a pillow under the knee. Place it under the heel.  As  directed    Do not sit on low chairs, stoools or toilet seats, as it may be difficult to get up from low surfaces  As directed    Driving restrictions  As directed    Comments:     No driving until released by the physician.   Increase activity slowly as tolerated  As directed    Lifting restrictions  As directed    Comments:     No lifting until released by the physician.   Patient may shower  As directed    Comments:     You may shower without a dressing once there is no drainage.  Do not wash over the wound.  If drainage remains, do not shower until drainage stops.   TED hose  As directed    Comments:     Use stockings (TED hose) for 3 weeks on both leg(s).  You may remove them at night for sleeping.   Weight bearing as tolerated  As directed        Medication List    STOP taking these medications       ANDROGEL PUMP 12.5 MG/ACT (1%) Gel  Generic drug:  Testosterone     HYDROcodone-acetaminophen 10-325 MG per tablet  Commonly known as:  NORCO     Vitamin D 2000 UNITS Caps      TAKE these medications       albuterol 108 (90 BASE) MCG/ACT inhaler  Commonly known as:  PROVENTIL HFA;VENTOLIN HFA  Inhale 2 puffs into the lungs. As needed for wheezing     allopurinol 300 MG tablet  Commonly known as:  ZYLOPRIM  Take 300 mg by mouth daily.     doxazosin 2 MG tablet  Commonly known as:  CARDURA  Take 2 mg by mouth every morning.     fluticasone 50 MCG/ACT nasal spray  Commonly known as:  FLONASE  Place 2 sprays into the nose daily as needed. For allergies     hyoscyamine 0.125 MG tablet  Commonly known as:  LEVSIN, ANASPAZ  Take 0.125 mg by mouth every 4 (four) hours as needed for cramping.     levocetirizine 5 MG tablet  Commonly known as:  XYZAL  Take 5 mg by mouth daily as needed. For allergies     meclizine 25 MG tablet  Commonly known as:  ANTIVERT  Take 25 mg by mouth 2 (two) times daily as needed. Take mid afternoon and at bedtime to prevent dizziness       methocarbamol 500 MG tablet  Commonly known as:  ROBAXIN  Take 1 tablet (500 mg total) by mouth every 6 (six) hours as needed.     omeprazole 20 MG capsule  Commonly known as:  PRILOSEC  Take 1 capsule (20 mg total) by mouth daily.     oxyCODONE 5 MG immediate release tablet  Commonly known as:  Oxy IR/ROXICODONE  Take 1-2 tablets (5-10 mg total) by mouth every 3 (three) hours as needed.  potassium citrate 10 MEQ (1080 MG) SR tablet  Commonly known as:  UROCIT-K  Take 10 mEq by mouth 2 (two) times daily.     predniSONE 10 MG tablet  Commonly known as:  DELTASONE  Take 5-10 mg by mouth daily as needed. For gout flare up     Rivaroxaban 20 MG Tabs tablet  Commonly known as:  XARELTO  - Take 1 tablet (20 mg total) by mouth daily. Take Xarelto for three weeks, then discontinue Xarelto.  - Once the patient has completed the blood thinner regimen, then take a Baby 81 mg Aspirin daily for four more weeks.     senna 8.6 MG tablet  Commonly known as:  SENOKOT  Take 2-4 tablets by mouth at bedtime as needed for constipation.     traMADol 50 MG tablet  Commonly known as:  ULTRAM  Take 1-2 tablets (50-100 mg total) by mouth every 6 (six) hours as needed (mild pain).     triamcinolone 0.025 % cream  Commonly known as:  KENALOG  Apply 1 application topically 2 (two) times daily as needed. For dry spots on hands     triamterene-hydrochlorothiazide 75-50 MG per tablet  Commonly known as:  MAXZIDE  Take 0.5 tablets by mouth every morning.     triazolam 0.25 MG tablet  Commonly known as:  HALCION  Take 0.25 mg by mouth at bedtime as needed (for sleep).       Follow-up Information   Follow up with [facility]. (Home Health Physical Therapy)    Contact information:   [phone]      Follow up with Loanne Drilling, MD. Schedule an appointment as soon as possible for a visit in 2 weeks.   Specialty:  Orthopedic Surgery   Contact information:   [address] [place] [address] [phone]       Signed: Patrica Duel 07/06/2013, 8:54 PM

## 2013-06-19 NOTE — Plan of Care (Signed)
Problem: Discharge Progression Outcomes Goal: Anticoagulant follow-up in place Outcome: Not Applicable Date Met:  06/19/13 xarelto

## 2013-06-19 NOTE — Progress Notes (Signed)
Occupational Therapy Treatment Patient Details Name: [name] MRN: [id number] DOB: 05/07/1946 Today's Date: 06/19/2013 Time:  -     OT Assessment / Plan / Recommendation  History of present illness R TKR   OT comments  Pt evaled and D/C'd from OT yesterday; however PT asked me to come in and address a concern with the patient. In talking with him he does need a 3n1. This recommendation has now been put in and weekend case manager called with a message left on voice mail.        Equipment Recommendations  3 in 1 bedside comode                    Visit Information  Last OT Received On: 06/19/13 History of Present Illness: R TKR                          Evette Georges [phone] 06/19/2013, 8:51 AM

## 2013-06-19 NOTE — Progress Notes (Signed)
Physical Therapy Treatment Patient Details Name: [name] MRN: [id number] DOB: 21-Jan-1946 Today's Date: 06/19/2013 Time: 7829-5621 PT Time Calculation (min): 33 min  PT Assessment / Plan / Recommendation  History of Present Illness R TKR   PT Comments     Follow Up Recommendations  Home health PT     Does the patient have the potential to tolerate intense rehabilitation     Barriers to Discharge        Equipment Recommendations  None recommended by PT    Recommendations for Other Services OT consult  Frequency 7X/week   Progress towards PT Goals Progress towards PT goals: Progressing toward goals  Plan Current plan remains appropriate    Precautions / Restrictions Precautions Precautions: Fall;Knee Precaution Comments: Pt unable to perform IND SLR this am Required Braces or Orthoses: Knee Immobilizer - Right Knee Immobilizer - Right: Discontinue once straight leg raise with < 10 degree lag Restrictions Weight Bearing Restrictions: No Other Position/Activity Restrictions: WBAT   Pertinent Vitals/Pain 6/10; premed, cold packs provided    Mobility  Bed Mobility Bed Mobility: Supine to Sit Supine to Sit: 4: Min guard Details for Bed Mobility Assistance: cues for sequence and min guard for R LE Transfers Transfers: Sit to Stand;Stand to Sit Sit to Stand: 4: Min guard Stand to Sit: 4: Min guard Details for Transfer Assistance: cues for LE management and use of UEs to self assist Ambulation/Gait Ambulation/Gait Assistance: 4: Min guard;5: Supervision Ambulation Distance (Feet): 95 Feet Assistive device: Rolling walker Ambulation/Gait Assistance Details: cues for posture, position from RW and stride length Gait Pattern: Step-to pattern;Decreased step length - right;Decreased step length - left;Shuffle Stairs: No    Exercises Total Joint Exercises Ankle Circles/Pumps: AAROM;Supine;Both;20 reps Quad Sets: AROM;Supine;Both;15 reps Heel Slides:  AAROM;Right;Supine;15 reps Straight Leg Raises: AAROM;Right;Supine;15 reps Goniometric ROM: AAROM at R knee -10 - 60   PT Diagnosis:    PT Problem List:   PT Treatment Interventions:     PT Goals (current goals can now be found in the care plan section) Acute Rehab PT Goals Patient Stated Goal: Resume previous lifestyle with decreased pain PT Goal Formulation: With patient Time For Goal Achievement: 06/24/13 Potential to Achieve Goals: Good  Visit Information  Last PT Received On: 06/19/13 Assistance Needed: +1 History of Present Illness: R TKR    Subjective Data  Subjective: If I do well I can go home this afternoon Patient Stated Goal: Resume previous lifestyle with decreased pain   Cognition  Cognition Arousal/Alertness: Awake/alert Behavior During Therapy: WFL for tasks assessed/performed Overall Cognitive Status: Within Functional Limits for tasks assessed    Balance     End of Session PT - End of Session Equipment Utilized During Treatment: Gait belt;Right knee immobilizer Activity Tolerance: Patient tolerated treatment well Patient left: with call bell/phone within reach;in chair Nurse Communication: Mobility status   GP     [name] 06/19/2013, 12:45 PM

## 2013-06-19 NOTE — Progress Notes (Signed)
Physical Therapy Treatment Patient Details Name: [name] MRN: [id number] DOB: [birth year] Today's Date: 06/19/2013 Time: 4098-1191 PT Time Calculation (min): 25 min  PT Assessment / Plan / Recommendation  History of Present Illness R TKR   PT Comments   Reviewed don/doff KI and stairs fwd and bkwd with handouts provided  Follow Up Recommendations  Home health PT     Does the patient have the potential to tolerate intense rehabilitation     Barriers to Discharge        Equipment Recommendations  None recommended by PT    Recommendations for Other Services OT consult  Frequency 7X/week   Progress towards PT Goals Progress towards PT goals: Progressing toward goals  Plan Current plan remains appropriate    Precautions / Restrictions Precautions Precautions: Fall;Knee Precaution Comments: Pt unable to perform IND SLR this am Required Braces or Orthoses: Knee Immobilizer - Right Knee Immobilizer - Right: Discontinue once straight leg raise with < 10 degree lag Restrictions Weight Bearing Restrictions: No Other Position/Activity Restrictions: WBAT   Pertinent Vitals/Pain 4/10; premed    Mobility  Bed Mobility Bed Mobility: Supine to Sit Supine to Sit: 4: Min guard Details for Bed Mobility Assistance: cues for sequence and min guard for R LE Transfers Transfers: Sit to Stand;Stand to Sit Sit to Stand: 4: Min guard Stand to Sit: 5: Supervision Details for Transfer Assistance: cues for LE management and use of UEs to self assist Ambulation/Gait Ambulation/Gait Assistance: 4: Min guard;5: Supervision Ambulation Distance (Feet): 78 Feet Assistive device: Rolling walker Ambulation/Gait Assistance Details: min cues for position from RW Gait Pattern: Step-to pattern;Antalgic;Trunk flexed Stairs: Yes Stairs Assistance: 4: Min assist Stairs Assistance Details (indicate cue type and reason): cues for sequence and foot/RW/crutch placement Stair Management Technique: No  rails;One rail Right;Step to pattern;Backwards;Forwards;With walker;With crutches Number of Stairs: 8 (4 bkwd w RW; 4 fwd with crutch and rail)    Exercises     PT Diagnosis:    PT Problem List:   PT Treatment Interventions:     PT Goals (current goals can now be found in the care plan section) Acute Rehab PT Goals Patient Stated Goal: Resume previous lifestyle with decreased pain PT Goal Formulation: With patient Time For Goal Achievement: 06/24/13 Potential to Achieve Goals: Good  Visit Information  Last PT Received On: 06/19/13 Assistance Needed: +1 History of Present Illness: R TKR    Subjective Data  Patient Stated Goal: Resume previous lifestyle with decreased pain   Cognition  Cognition Arousal/Alertness: Awake/alert Behavior During Therapy: WFL for tasks assessed/performed Overall Cognitive Status: Within Functional Limits for tasks assessed    Balance     End of Session PT - End of Session Equipment Utilized During Treatment: Gait belt;Right knee immobilizer Activity Tolerance: Patient tolerated treatment well Patient left: with call bell/phone within reach;in chair Nurse Communication: Mobility status   GP     Trinna Kunst 06/19/2013, 4:48 PM

## 2013-06-20 ENCOUNTER — Encounter (HOSPITAL_COMMUNITY): Payer: Self-pay | Admitting: Orthopedic Surgery

## 2013-06-20 DIAGNOSIS — Z96659 Presence of unspecified artificial knee joint: Secondary | ICD-10-CM | POA: Diagnosis not present

## 2013-06-20 DIAGNOSIS — M1710 Unilateral primary osteoarthritis, unspecified knee: Secondary | ICD-10-CM | POA: Diagnosis not present

## 2013-06-20 DIAGNOSIS — IMO0001 Reserved for inherently not codable concepts without codable children: Secondary | ICD-10-CM | POA: Diagnosis not present

## 2013-06-20 DIAGNOSIS — Z471 Aftercare following joint replacement surgery: Secondary | ICD-10-CM | POA: Diagnosis not present

## 2013-06-20 DIAGNOSIS — F411 Generalized anxiety disorder: Secondary | ICD-10-CM | POA: Diagnosis not present

## 2013-06-20 DIAGNOSIS — J45909 Unspecified asthma, uncomplicated: Secondary | ICD-10-CM | POA: Diagnosis not present

## 2013-06-21 DIAGNOSIS — J45909 Unspecified asthma, uncomplicated: Secondary | ICD-10-CM | POA: Diagnosis not present

## 2013-06-21 DIAGNOSIS — IMO0001 Reserved for inherently not codable concepts without codable children: Secondary | ICD-10-CM | POA: Diagnosis not present

## 2013-06-21 DIAGNOSIS — Z96659 Presence of unspecified artificial knee joint: Secondary | ICD-10-CM | POA: Diagnosis not present

## 2013-06-21 DIAGNOSIS — Z471 Aftercare following joint replacement surgery: Secondary | ICD-10-CM | POA: Diagnosis not present

## 2013-06-21 DIAGNOSIS — F411 Generalized anxiety disorder: Secondary | ICD-10-CM | POA: Diagnosis not present

## 2013-06-22 DIAGNOSIS — Z471 Aftercare following joint replacement surgery: Secondary | ICD-10-CM | POA: Diagnosis not present

## 2013-06-22 DIAGNOSIS — Z96659 Presence of unspecified artificial knee joint: Secondary | ICD-10-CM | POA: Diagnosis not present

## 2013-06-22 DIAGNOSIS — F411 Generalized anxiety disorder: Secondary | ICD-10-CM | POA: Diagnosis not present

## 2013-06-22 DIAGNOSIS — J45909 Unspecified asthma, uncomplicated: Secondary | ICD-10-CM | POA: Diagnosis not present

## 2013-06-22 DIAGNOSIS — IMO0001 Reserved for inherently not codable concepts without codable children: Secondary | ICD-10-CM | POA: Diagnosis not present

## 2013-06-23 DIAGNOSIS — J45909 Unspecified asthma, uncomplicated: Secondary | ICD-10-CM | POA: Diagnosis not present

## 2013-06-23 DIAGNOSIS — Z96659 Presence of unspecified artificial knee joint: Secondary | ICD-10-CM | POA: Diagnosis not present

## 2013-06-23 DIAGNOSIS — F411 Generalized anxiety disorder: Secondary | ICD-10-CM | POA: Diagnosis not present

## 2013-06-23 DIAGNOSIS — IMO0001 Reserved for inherently not codable concepts without codable children: Secondary | ICD-10-CM | POA: Diagnosis not present

## 2013-06-23 DIAGNOSIS — Z471 Aftercare following joint replacement surgery: Secondary | ICD-10-CM | POA: Diagnosis not present

## 2013-06-24 DIAGNOSIS — IMO0001 Reserved for inherently not codable concepts without codable children: Secondary | ICD-10-CM | POA: Diagnosis not present

## 2013-06-24 DIAGNOSIS — F411 Generalized anxiety disorder: Secondary | ICD-10-CM | POA: Diagnosis not present

## 2013-06-24 DIAGNOSIS — J45909 Unspecified asthma, uncomplicated: Secondary | ICD-10-CM | POA: Diagnosis not present

## 2013-06-24 DIAGNOSIS — Z471 Aftercare following joint replacement surgery: Secondary | ICD-10-CM | POA: Diagnosis not present

## 2013-06-24 DIAGNOSIS — Z96659 Presence of unspecified artificial knee joint: Secondary | ICD-10-CM | POA: Diagnosis not present

## 2013-06-27 DIAGNOSIS — F411 Generalized anxiety disorder: Secondary | ICD-10-CM | POA: Diagnosis not present

## 2013-06-27 DIAGNOSIS — Z96659 Presence of unspecified artificial knee joint: Secondary | ICD-10-CM | POA: Diagnosis not present

## 2013-06-27 DIAGNOSIS — J45909 Unspecified asthma, uncomplicated: Secondary | ICD-10-CM | POA: Diagnosis not present

## 2013-06-27 DIAGNOSIS — Z471 Aftercare following joint replacement surgery: Secondary | ICD-10-CM | POA: Diagnosis not present

## 2013-06-27 DIAGNOSIS — IMO0001 Reserved for inherently not codable concepts without codable children: Secondary | ICD-10-CM | POA: Diagnosis not present

## 2013-06-28 DIAGNOSIS — Z471 Aftercare following joint replacement surgery: Secondary | ICD-10-CM | POA: Diagnosis not present

## 2013-06-28 DIAGNOSIS — J45909 Unspecified asthma, uncomplicated: Secondary | ICD-10-CM | POA: Diagnosis not present

## 2013-06-28 DIAGNOSIS — IMO0001 Reserved for inherently not codable concepts without codable children: Secondary | ICD-10-CM | POA: Diagnosis not present

## 2013-06-28 DIAGNOSIS — Z96659 Presence of unspecified artificial knee joint: Secondary | ICD-10-CM | POA: Diagnosis not present

## 2013-06-28 DIAGNOSIS — F411 Generalized anxiety disorder: Secondary | ICD-10-CM | POA: Diagnosis not present

## 2013-06-29 DIAGNOSIS — IMO0001 Reserved for inherently not codable concepts without codable children: Secondary | ICD-10-CM | POA: Diagnosis not present

## 2013-06-29 DIAGNOSIS — Z471 Aftercare following joint replacement surgery: Secondary | ICD-10-CM | POA: Diagnosis not present

## 2013-06-29 DIAGNOSIS — F411 Generalized anxiety disorder: Secondary | ICD-10-CM | POA: Diagnosis not present

## 2013-06-29 DIAGNOSIS — J45909 Unspecified asthma, uncomplicated: Secondary | ICD-10-CM | POA: Diagnosis not present

## 2013-06-29 DIAGNOSIS — Z96659 Presence of unspecified artificial knee joint: Secondary | ICD-10-CM | POA: Diagnosis not present

## 2013-06-30 DIAGNOSIS — IMO0001 Reserved for inherently not codable concepts without codable children: Secondary | ICD-10-CM | POA: Diagnosis not present

## 2013-06-30 DIAGNOSIS — Z96659 Presence of unspecified artificial knee joint: Secondary | ICD-10-CM | POA: Diagnosis not present

## 2013-06-30 DIAGNOSIS — Z471 Aftercare following joint replacement surgery: Secondary | ICD-10-CM | POA: Diagnosis not present

## 2013-06-30 DIAGNOSIS — J45909 Unspecified asthma, uncomplicated: Secondary | ICD-10-CM | POA: Diagnosis not present

## 2013-06-30 DIAGNOSIS — F411 Generalized anxiety disorder: Secondary | ICD-10-CM | POA: Diagnosis not present

## 2013-07-01 DIAGNOSIS — Z96659 Presence of unspecified artificial knee joint: Secondary | ICD-10-CM | POA: Diagnosis not present

## 2013-07-01 DIAGNOSIS — F411 Generalized anxiety disorder: Secondary | ICD-10-CM | POA: Diagnosis not present

## 2013-07-01 DIAGNOSIS — J45909 Unspecified asthma, uncomplicated: Secondary | ICD-10-CM | POA: Diagnosis not present

## 2013-07-01 DIAGNOSIS — IMO0001 Reserved for inherently not codable concepts without codable children: Secondary | ICD-10-CM | POA: Diagnosis not present

## 2013-07-01 DIAGNOSIS — Z471 Aftercare following joint replacement surgery: Secondary | ICD-10-CM | POA: Diagnosis not present

## 2013-07-11 DIAGNOSIS — M1710 Unilateral primary osteoarthritis, unspecified knee: Secondary | ICD-10-CM | POA: Diagnosis not present

## 2013-07-11 DIAGNOSIS — R262 Difficulty in walking, not elsewhere classified: Secondary | ICD-10-CM | POA: Diagnosis not present

## 2013-07-11 DIAGNOSIS — M25569 Pain in unspecified knee: Secondary | ICD-10-CM | POA: Diagnosis not present

## 2013-07-13 DIAGNOSIS — M25569 Pain in unspecified knee: Secondary | ICD-10-CM | POA: Diagnosis not present

## 2013-07-13 DIAGNOSIS — M1710 Unilateral primary osteoarthritis, unspecified knee: Secondary | ICD-10-CM | POA: Diagnosis not present

## 2013-07-13 DIAGNOSIS — R262 Difficulty in walking, not elsewhere classified: Secondary | ICD-10-CM | POA: Diagnosis not present

## 2013-07-14 DIAGNOSIS — M1710 Unilateral primary osteoarthritis, unspecified knee: Secondary | ICD-10-CM | POA: Diagnosis not present

## 2013-07-14 DIAGNOSIS — M25569 Pain in unspecified knee: Secondary | ICD-10-CM | POA: Diagnosis not present

## 2013-07-14 DIAGNOSIS — R262 Difficulty in walking, not elsewhere classified: Secondary | ICD-10-CM | POA: Diagnosis not present

## 2013-07-18 DIAGNOSIS — R262 Difficulty in walking, not elsewhere classified: Secondary | ICD-10-CM | POA: Diagnosis not present

## 2013-07-18 DIAGNOSIS — M25569 Pain in unspecified knee: Secondary | ICD-10-CM | POA: Diagnosis not present

## 2013-07-18 DIAGNOSIS — M1710 Unilateral primary osteoarthritis, unspecified knee: Secondary | ICD-10-CM | POA: Diagnosis not present

## 2013-07-19 DIAGNOSIS — M25569 Pain in unspecified knee: Secondary | ICD-10-CM | POA: Diagnosis not present

## 2013-07-19 DIAGNOSIS — M1710 Unilateral primary osteoarthritis, unspecified knee: Secondary | ICD-10-CM | POA: Diagnosis not present

## 2013-07-19 DIAGNOSIS — R262 Difficulty in walking, not elsewhere classified: Secondary | ICD-10-CM | POA: Diagnosis not present

## 2013-07-21 DIAGNOSIS — M25569 Pain in unspecified knee: Secondary | ICD-10-CM | POA: Diagnosis not present

## 2013-07-21 DIAGNOSIS — R262 Difficulty in walking, not elsewhere classified: Secondary | ICD-10-CM | POA: Diagnosis not present

## 2013-07-21 DIAGNOSIS — M1710 Unilateral primary osteoarthritis, unspecified knee: Secondary | ICD-10-CM | POA: Diagnosis not present

## 2013-07-22 DIAGNOSIS — M129 Arthropathy, unspecified: Secondary | ICD-10-CM | POA: Diagnosis not present

## 2013-07-22 DIAGNOSIS — Z23 Encounter for immunization: Secondary | ICD-10-CM | POA: Diagnosis not present

## 2013-07-22 NOTE — Progress Notes (Signed)
PT Evaluation - addendum Late entry for 06/17/13   06/17/13 1700  PT Time Calculation  PT Start Time 1700  PT Stop Time 1735  PT Time Calculation (min) 35 min  PT G-Codes **NOT FOR INPATIENT CLASS**  Functional Assessment Tool Used clinical judgement  Functional Limitation Mobility: Walking and moving around  Mobility: Walking and Moving Around Current Status [phone]) CK  Mobility: Walking and Moving Around Goal Status (N5621) CI  PT General Charges  $$ ACUTE PT VISIT 1 Procedure  PT Evaluation  $Initial PT Evaluation Tier I 1 Procedure  PT Treatments  $Gait Training 8-22 mins  $Therapeutic Exercise 8-22 mins   07/22/2013 Corlis Hove, PT [phone]

## 2013-07-22 NOTE — Progress Notes (Signed)
OT Evaluation - addendum; late entry   2013/06/21 1644  OT Visit Information  Last OT Received On 06-21-2013  OT G-codes **NOT FOR INPATIENT CLASS**  Functional Assessment Tool Used clinical judgement  Functional Limitation Self care  Self Care Current Status [phone]) CI  Self Care Goal Status (U0454) CI  Self Care Discharge Status [phone]) CI  [facility], OTR/L  [phone] 07/22/2013

## 2013-07-25 DIAGNOSIS — R262 Difficulty in walking, not elsewhere classified: Secondary | ICD-10-CM | POA: Diagnosis not present

## 2013-07-25 DIAGNOSIS — M25569 Pain in unspecified knee: Secondary | ICD-10-CM | POA: Diagnosis not present

## 2013-07-25 DIAGNOSIS — M1710 Unilateral primary osteoarthritis, unspecified knee: Secondary | ICD-10-CM | POA: Diagnosis not present

## 2013-07-26 DIAGNOSIS — M1710 Unilateral primary osteoarthritis, unspecified knee: Secondary | ICD-10-CM | POA: Diagnosis not present

## 2013-08-02 DIAGNOSIS — M069 Rheumatoid arthritis, unspecified: Secondary | ICD-10-CM | POA: Diagnosis not present

## 2013-08-09 DIAGNOSIS — M545 Low back pain, unspecified: Secondary | ICD-10-CM | POA: Diagnosis not present

## 2013-08-23 DIAGNOSIS — M069 Rheumatoid arthritis, unspecified: Secondary | ICD-10-CM | POA: Diagnosis not present

## 2013-08-23 DIAGNOSIS — M109 Gout, unspecified: Secondary | ICD-10-CM | POA: Diagnosis not present

## 2013-08-31 DIAGNOSIS — M549 Dorsalgia, unspecified: Secondary | ICD-10-CM | POA: Diagnosis not present

## 2013-08-31 DIAGNOSIS — J019 Acute sinusitis, unspecified: Secondary | ICD-10-CM | POA: Diagnosis not present

## 2013-09-22 DIAGNOSIS — Z1211 Encounter for screening for malignant neoplasm of colon: Secondary | ICD-10-CM | POA: Diagnosis not present

## 2013-09-22 DIAGNOSIS — K219 Gastro-esophageal reflux disease without esophagitis: Secondary | ICD-10-CM | POA: Diagnosis not present

## 2013-09-22 DIAGNOSIS — K222 Esophageal obstruction: Secondary | ICD-10-CM | POA: Diagnosis not present

## 2013-09-22 DIAGNOSIS — K5900 Constipation, unspecified: Secondary | ICD-10-CM | POA: Diagnosis not present

## 2013-09-26 DIAGNOSIS — N401 Enlarged prostate with lower urinary tract symptoms: Secondary | ICD-10-CM | POA: Diagnosis not present

## 2013-09-26 DIAGNOSIS — M109 Gout, unspecified: Secondary | ICD-10-CM | POA: Diagnosis not present

## 2013-09-26 DIAGNOSIS — E291 Testicular hypofunction: Secondary | ICD-10-CM | POA: Diagnosis not present

## 2013-09-26 DIAGNOSIS — M069 Rheumatoid arthritis, unspecified: Secondary | ICD-10-CM | POA: Diagnosis not present

## 2013-09-26 DIAGNOSIS — N529 Male erectile dysfunction, unspecified: Secondary | ICD-10-CM | POA: Diagnosis not present

## 2013-09-26 DIAGNOSIS — N139 Obstructive and reflux uropathy, unspecified: Secondary | ICD-10-CM | POA: Diagnosis not present

## 2013-10-03 DIAGNOSIS — M069 Rheumatoid arthritis, unspecified: Secondary | ICD-10-CM | POA: Diagnosis not present

## 2013-10-11 DIAGNOSIS — J018 Other acute sinusitis: Secondary | ICD-10-CM | POA: Diagnosis not present

## 2013-10-28 DIAGNOSIS — I10 Essential (primary) hypertension: Secondary | ICD-10-CM | POA: Diagnosis not present

## 2013-10-28 DIAGNOSIS — M109 Gout, unspecified: Secondary | ICD-10-CM | POA: Diagnosis not present

## 2013-10-28 DIAGNOSIS — J45909 Unspecified asthma, uncomplicated: Secondary | ICD-10-CM | POA: Diagnosis not present

## 2013-10-28 DIAGNOSIS — N400 Enlarged prostate without lower urinary tract symptoms: Secondary | ICD-10-CM | POA: Diagnosis not present

## 2013-10-28 DIAGNOSIS — I5030 Unspecified diastolic (congestive) heart failure: Secondary | ICD-10-CM | POA: Diagnosis not present

## 2013-10-28 DIAGNOSIS — M069 Rheumatoid arthritis, unspecified: Secondary | ICD-10-CM | POA: Diagnosis not present

## 2013-10-28 DIAGNOSIS — E559 Vitamin D deficiency, unspecified: Secondary | ICD-10-CM | POA: Diagnosis not present

## 2013-10-31 DIAGNOSIS — M545 Low back pain, unspecified: Secondary | ICD-10-CM | POA: Diagnosis not present

## 2013-10-31 DIAGNOSIS — Z96659 Presence of unspecified artificial knee joint: Secondary | ICD-10-CM | POA: Diagnosis not present

## 2013-11-25 ENCOUNTER — Encounter: Payer: Self-pay | Admitting: Gastroenterology

## 2013-11-28 DIAGNOSIS — M069 Rheumatoid arthritis, unspecified: Secondary | ICD-10-CM | POA: Diagnosis not present

## 2013-11-30 DIAGNOSIS — M545 Low back pain, unspecified: Secondary | ICD-10-CM | POA: Diagnosis not present

## 2013-12-21 DIAGNOSIS — M069 Rheumatoid arthritis, unspecified: Secondary | ICD-10-CM | POA: Diagnosis not present

## 2013-12-21 DIAGNOSIS — M109 Gout, unspecified: Secondary | ICD-10-CM | POA: Diagnosis not present

## 2013-12-23 DIAGNOSIS — L820 Inflamed seborrheic keratosis: Secondary | ICD-10-CM | POA: Diagnosis not present

## 2013-12-23 DIAGNOSIS — L821 Other seborrheic keratosis: Secondary | ICD-10-CM | POA: Diagnosis not present

## 2013-12-28 DIAGNOSIS — M1710 Unilateral primary osteoarthritis, unspecified knee: Secondary | ICD-10-CM | POA: Diagnosis not present

## 2013-12-28 DIAGNOSIS — M545 Low back pain, unspecified: Secondary | ICD-10-CM | POA: Diagnosis not present

## 2014-01-17 DIAGNOSIS — M069 Rheumatoid arthritis, unspecified: Secondary | ICD-10-CM | POA: Diagnosis not present

## 2014-01-18 DIAGNOSIS — K222 Esophageal obstruction: Secondary | ICD-10-CM | POA: Diagnosis not present

## 2014-01-18 DIAGNOSIS — Z1211 Encounter for screening for malignant neoplasm of colon: Secondary | ICD-10-CM | POA: Diagnosis not present

## 2014-01-18 DIAGNOSIS — K5900 Constipation, unspecified: Secondary | ICD-10-CM | POA: Diagnosis not present

## 2014-01-18 DIAGNOSIS — K219 Gastro-esophageal reflux disease without esophagitis: Secondary | ICD-10-CM | POA: Diagnosis not present

## 2014-01-27 DIAGNOSIS — E785 Hyperlipidemia, unspecified: Secondary | ICD-10-CM | POA: Diagnosis not present

## 2014-01-27 DIAGNOSIS — M069 Rheumatoid arthritis, unspecified: Secondary | ICD-10-CM | POA: Diagnosis not present

## 2014-01-30 DIAGNOSIS — M069 Rheumatoid arthritis, unspecified: Secondary | ICD-10-CM | POA: Diagnosis not present

## 2014-02-07 DIAGNOSIS — Z1211 Encounter for screening for malignant neoplasm of colon: Secondary | ICD-10-CM | POA: Diagnosis not present

## 2014-02-07 DIAGNOSIS — K5900 Constipation, unspecified: Secondary | ICD-10-CM | POA: Diagnosis not present

## 2014-02-07 DIAGNOSIS — Z79899 Other long term (current) drug therapy: Secondary | ICD-10-CM | POA: Diagnosis not present

## 2014-02-07 DIAGNOSIS — M069 Rheumatoid arthritis, unspecified: Secondary | ICD-10-CM | POA: Diagnosis not present

## 2014-02-07 DIAGNOSIS — N429 Disorder of prostate, unspecified: Secondary | ICD-10-CM | POA: Diagnosis not present

## 2014-02-07 DIAGNOSIS — K5730 Diverticulosis of large intestine without perforation or abscess without bleeding: Secondary | ICD-10-CM | POA: Diagnosis not present

## 2014-02-07 DIAGNOSIS — K222 Esophageal obstruction: Secondary | ICD-10-CM | POA: Diagnosis not present

## 2014-02-07 DIAGNOSIS — N289 Disorder of kidney and ureter, unspecified: Secondary | ICD-10-CM | POA: Diagnosis not present

## 2014-02-07 DIAGNOSIS — E538 Deficiency of other specified B group vitamins: Secondary | ICD-10-CM | POA: Diagnosis not present

## 2014-02-07 DIAGNOSIS — Z87442 Personal history of urinary calculi: Secondary | ICD-10-CM | POA: Diagnosis not present

## 2014-02-07 DIAGNOSIS — K449 Diaphragmatic hernia without obstruction or gangrene: Secondary | ICD-10-CM | POA: Diagnosis not present

## 2014-02-07 DIAGNOSIS — D129 Benign neoplasm of anus and anal canal: Secondary | ICD-10-CM | POA: Diagnosis not present

## 2014-02-07 DIAGNOSIS — I10 Essential (primary) hypertension: Secondary | ICD-10-CM | POA: Diagnosis not present

## 2014-02-07 DIAGNOSIS — K219 Gastro-esophageal reflux disease without esophagitis: Secondary | ICD-10-CM | POA: Diagnosis not present

## 2014-02-07 DIAGNOSIS — D128 Benign neoplasm of rectum: Secondary | ICD-10-CM | POA: Diagnosis not present

## 2014-02-07 DIAGNOSIS — K620 Anal polyp: Secondary | ICD-10-CM | POA: Diagnosis not present

## 2014-02-07 DIAGNOSIS — J45909 Unspecified asthma, uncomplicated: Secondary | ICD-10-CM | POA: Diagnosis not present

## 2014-02-10 DIAGNOSIS — B351 Tinea unguium: Secondary | ICD-10-CM | POA: Diagnosis not present

## 2014-02-10 DIAGNOSIS — H6690 Otitis media, unspecified, unspecified ear: Secondary | ICD-10-CM | POA: Diagnosis not present

## 2014-02-22 DIAGNOSIS — H6690 Otitis media, unspecified, unspecified ear: Secondary | ICD-10-CM | POA: Diagnosis not present

## 2014-02-22 DIAGNOSIS — J018 Other acute sinusitis: Secondary | ICD-10-CM | POA: Diagnosis not present

## 2014-02-22 DIAGNOSIS — M545 Low back pain, unspecified: Secondary | ICD-10-CM | POA: Diagnosis not present

## 2014-02-22 DIAGNOSIS — H60509 Unspecified acute noninfective otitis externa, unspecified ear: Secondary | ICD-10-CM | POA: Diagnosis not present

## 2014-03-08 DIAGNOSIS — H9190 Unspecified hearing loss, unspecified ear: Secondary | ICD-10-CM | POA: Diagnosis not present

## 2014-03-08 DIAGNOSIS — H9209 Otalgia, unspecified ear: Secondary | ICD-10-CM | POA: Diagnosis not present

## 2014-03-10 DIAGNOSIS — F528 Other sexual dysfunction not due to a substance or known physiological condition: Secondary | ICD-10-CM | POA: Diagnosis not present

## 2014-03-10 DIAGNOSIS — G4700 Insomnia, unspecified: Secondary | ICD-10-CM | POA: Diagnosis not present

## 2014-03-13 IMAGING — CR DG CHEST 2V
2 series · 2 of 2 positions shown · non-contrast
Comparison: 09/06/2012

CLINICAL DATA: Preop right ureteroscopy

CHEST - 2 VIEW

[w chest pa]
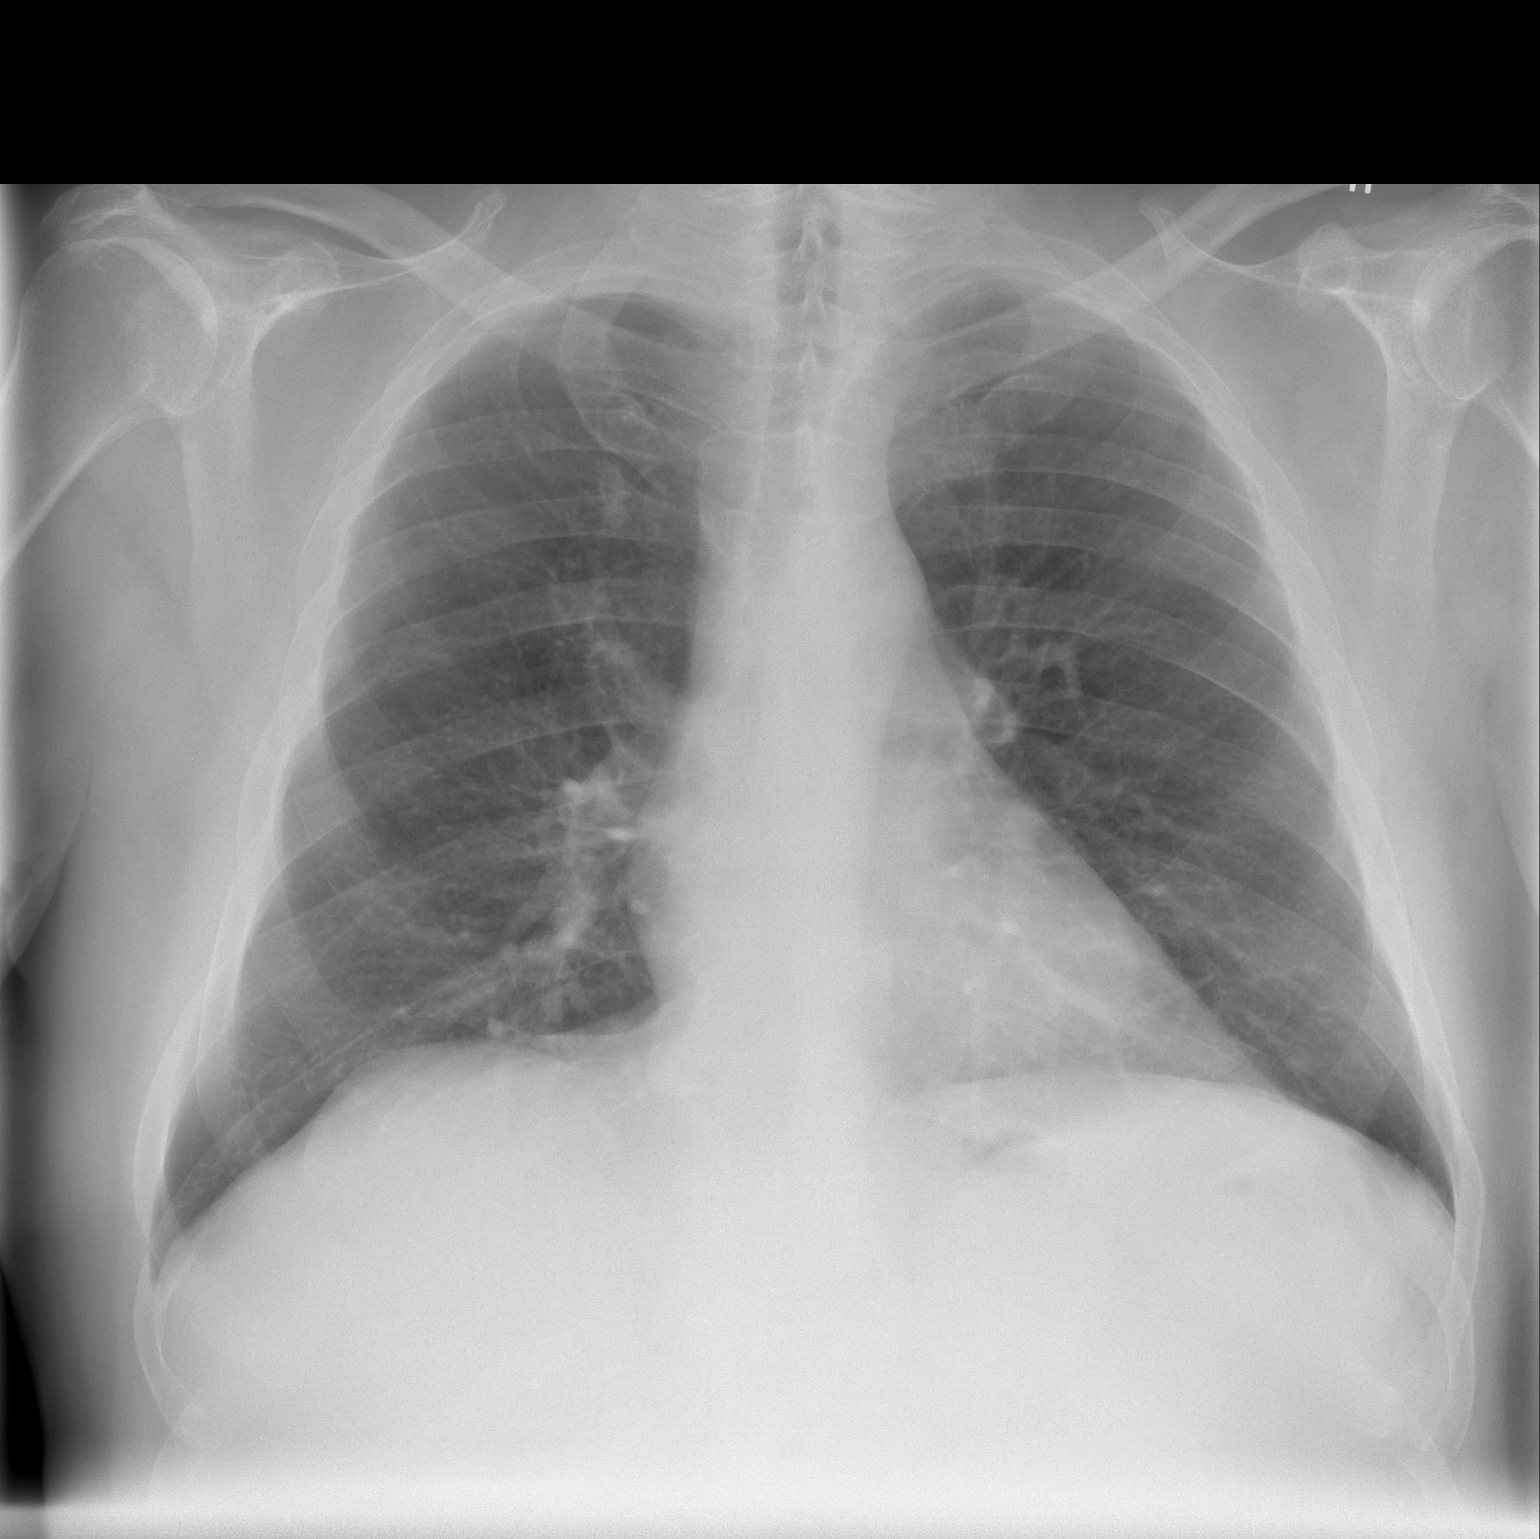

[w chest lat]
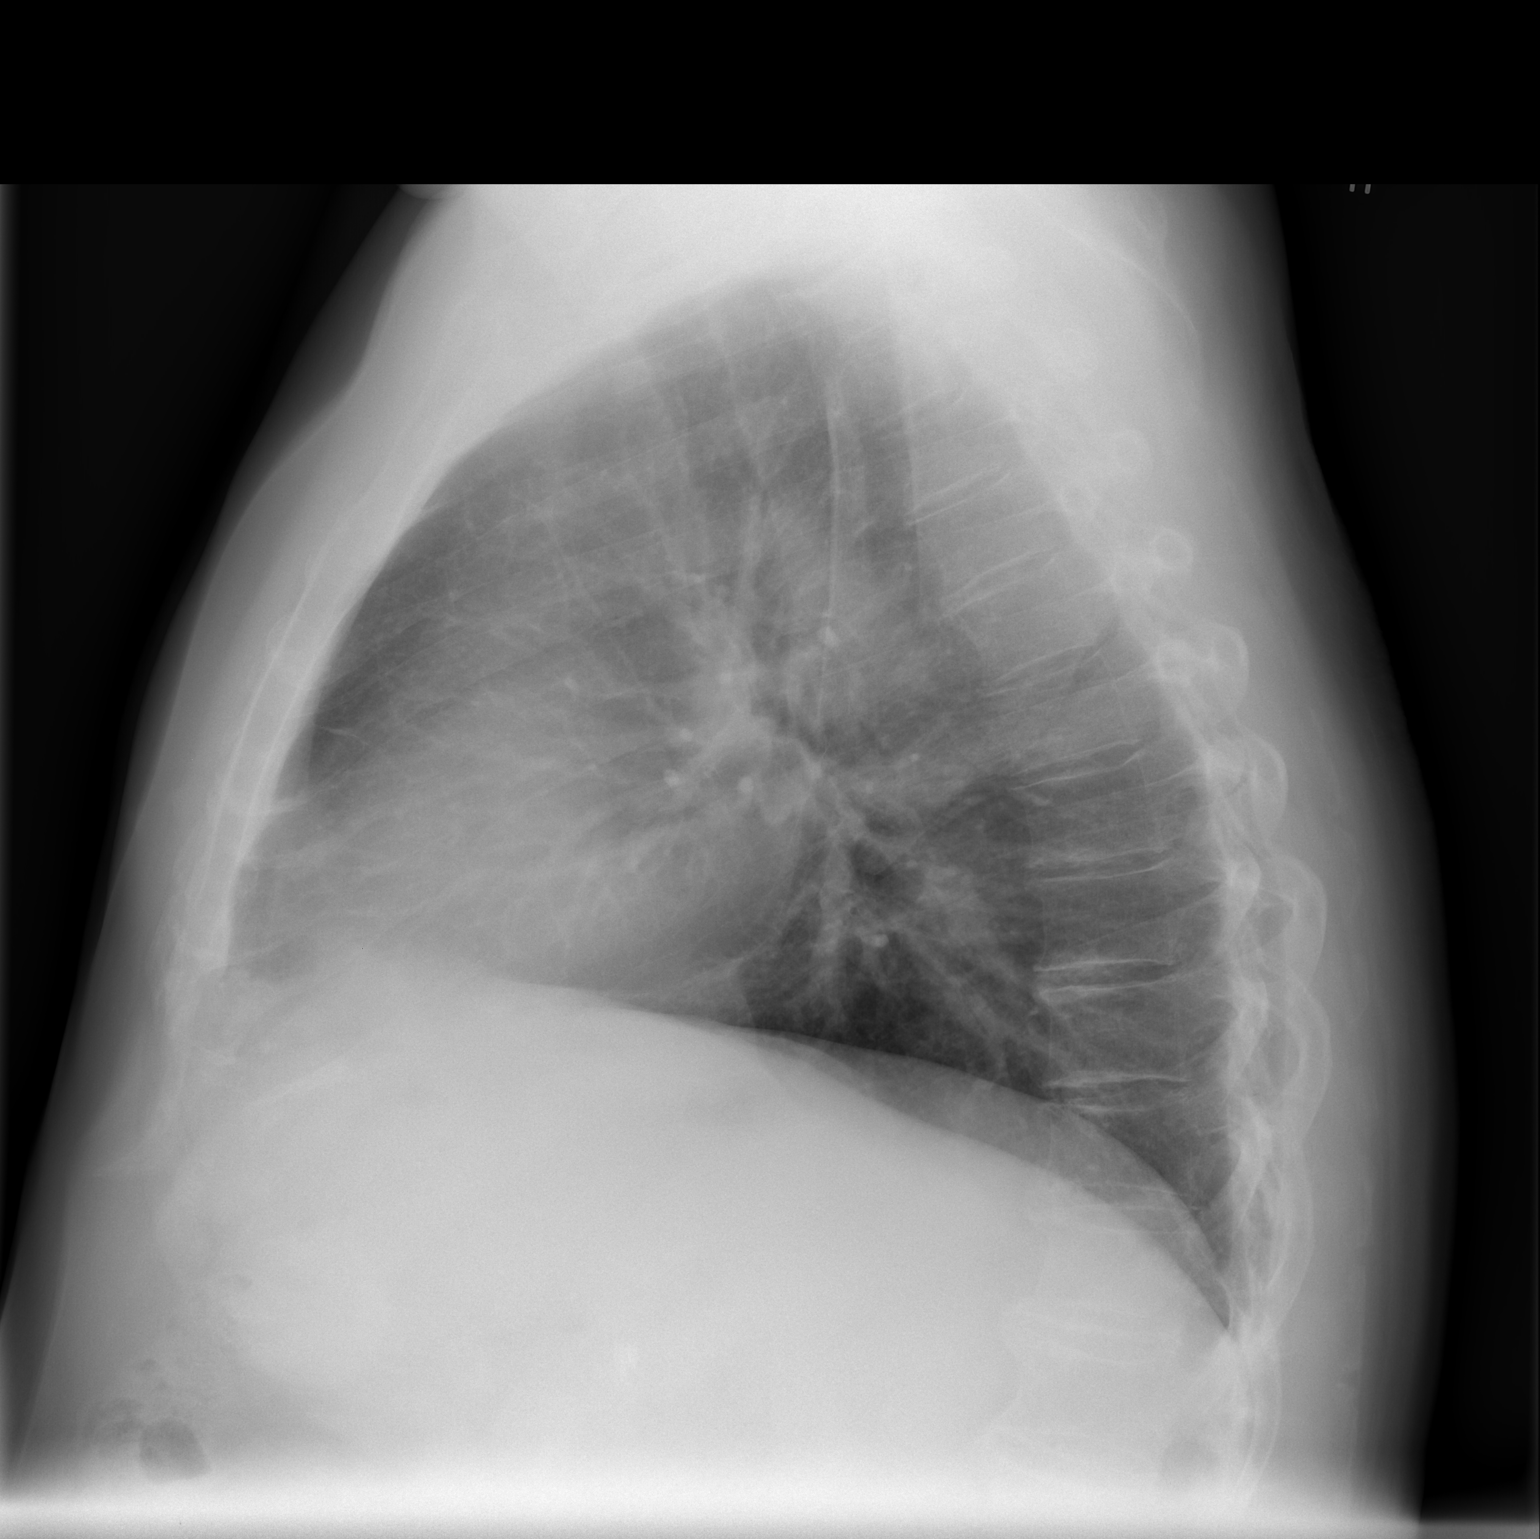

[2 of 2 positions shown; findings below may reference images not displayed]

FINDINGS: The heart size and mediastinal contours are within normal
limits.  Both lungs are clear.  The visualized skeletal structures
are unremarkable.
IMPRESSION: No active cardiopulmonary abnormalities.

## 2014-03-23 DIAGNOSIS — M069 Rheumatoid arthritis, unspecified: Secondary | ICD-10-CM | POA: Diagnosis not present

## 2014-03-27 DIAGNOSIS — N200 Calculus of kidney: Secondary | ICD-10-CM | POA: Diagnosis not present

## 2014-03-27 DIAGNOSIS — N529 Male erectile dysfunction, unspecified: Secondary | ICD-10-CM | POA: Diagnosis not present

## 2014-03-27 DIAGNOSIS — E291 Testicular hypofunction: Secondary | ICD-10-CM | POA: Diagnosis not present

## 2014-03-30 DIAGNOSIS — M069 Rheumatoid arthritis, unspecified: Secondary | ICD-10-CM | POA: Diagnosis not present

## 2014-03-30 DIAGNOSIS — Z125 Encounter for screening for malignant neoplasm of prostate: Secondary | ICD-10-CM | POA: Diagnosis not present

## 2014-03-30 DIAGNOSIS — M2550 Pain in unspecified joint: Secondary | ICD-10-CM | POA: Diagnosis not present

## 2014-03-30 DIAGNOSIS — M109 Gout, unspecified: Secondary | ICD-10-CM | POA: Diagnosis not present

## 2014-04-25 DIAGNOSIS — M545 Low back pain, unspecified: Secondary | ICD-10-CM | POA: Diagnosis not present

## 2014-04-28 DIAGNOSIS — G4700 Insomnia, unspecified: Secondary | ICD-10-CM | POA: Diagnosis not present

## 2014-04-28 DIAGNOSIS — E785 Hyperlipidemia, unspecified: Secondary | ICD-10-CM | POA: Diagnosis not present

## 2014-04-28 DIAGNOSIS — I10 Essential (primary) hypertension: Secondary | ICD-10-CM | POA: Diagnosis not present

## 2014-05-15 DIAGNOSIS — M069 Rheumatoid arthritis, unspecified: Secondary | ICD-10-CM | POA: Diagnosis not present

## 2014-06-05 DIAGNOSIS — N183 Chronic kidney disease, stage 3 unspecified: Secondary | ICD-10-CM | POA: Diagnosis not present

## 2014-06-05 DIAGNOSIS — N189 Chronic kidney disease, unspecified: Secondary | ICD-10-CM | POA: Diagnosis not present

## 2014-06-14 DIAGNOSIS — N2889 Other specified disorders of kidney and ureter: Secondary | ICD-10-CM | POA: Diagnosis not present

## 2014-06-14 DIAGNOSIS — N183 Chronic kidney disease, stage 3 unspecified: Secondary | ICD-10-CM | POA: Diagnosis not present

## 2014-06-14 DIAGNOSIS — N289 Disorder of kidney and ureter, unspecified: Secondary | ICD-10-CM | POA: Diagnosis not present

## 2014-06-26 ENCOUNTER — Encounter: Payer: Self-pay | Admitting: Gastroenterology

## 2014-06-27 DIAGNOSIS — M069 Rheumatoid arthritis, unspecified: Secondary | ICD-10-CM | POA: Diagnosis not present

## 2014-06-29 DIAGNOSIS — M069 Rheumatoid arthritis, unspecified: Secondary | ICD-10-CM | POA: Diagnosis not present

## 2014-06-29 DIAGNOSIS — N189 Chronic kidney disease, unspecified: Secondary | ICD-10-CM | POA: Diagnosis not present

## 2014-06-29 DIAGNOSIS — M2550 Pain in unspecified joint: Secondary | ICD-10-CM | POA: Diagnosis not present

## 2014-06-29 DIAGNOSIS — M109 Gout, unspecified: Secondary | ICD-10-CM | POA: Diagnosis not present

## 2014-07-05 DIAGNOSIS — N183 Chronic kidney disease, stage 3 unspecified: Secondary | ICD-10-CM | POA: Diagnosis not present

## 2014-07-05 DIAGNOSIS — N200 Calculus of kidney: Secondary | ICD-10-CM | POA: Diagnosis not present

## 2014-07-05 DIAGNOSIS — I129 Hypertensive chronic kidney disease with stage 1 through stage 4 chronic kidney disease, or unspecified chronic kidney disease: Secondary | ICD-10-CM | POA: Diagnosis not present

## 2014-07-18 DIAGNOSIS — M5136 Other intervertebral disc degeneration, lumbar region: Secondary | ICD-10-CM | POA: Diagnosis not present

## 2014-07-18 DIAGNOSIS — M546 Pain in thoracic spine: Secondary | ICD-10-CM | POA: Diagnosis not present

## 2014-07-18 DIAGNOSIS — M79602 Pain in left arm: Secondary | ICD-10-CM | POA: Diagnosis not present

## 2014-08-01 DIAGNOSIS — E785 Hyperlipidemia, unspecified: Secondary | ICD-10-CM | POA: Diagnosis not present

## 2014-08-01 DIAGNOSIS — N183 Chronic kidney disease, stage 3 (moderate): Secondary | ICD-10-CM | POA: Diagnosis not present

## 2014-08-01 DIAGNOSIS — I10 Essential (primary) hypertension: Secondary | ICD-10-CM | POA: Diagnosis not present

## 2014-08-01 DIAGNOSIS — J0100 Acute maxillary sinusitis, unspecified: Secondary | ICD-10-CM | POA: Diagnosis not present

## 2014-08-09 DIAGNOSIS — M545 Low back pain: Secondary | ICD-10-CM | POA: Diagnosis not present

## 2014-08-09 DIAGNOSIS — M5136 Other intervertebral disc degeneration, lumbar region: Secondary | ICD-10-CM | POA: Diagnosis not present

## 2014-08-23 DIAGNOSIS — N410 Acute prostatitis: Secondary | ICD-10-CM | POA: Diagnosis not present

## 2014-08-24 DIAGNOSIS — Z471 Aftercare following joint replacement surgery: Secondary | ICD-10-CM | POA: Diagnosis not present

## 2014-08-24 DIAGNOSIS — M109 Gout, unspecified: Secondary | ICD-10-CM | POA: Diagnosis not present

## 2014-08-24 DIAGNOSIS — Z96651 Presence of right artificial knee joint: Secondary | ICD-10-CM | POA: Diagnosis not present

## 2014-08-24 DIAGNOSIS — M1A09X0 Idiopathic chronic gout, multiple sites, without tophus (tophi): Secondary | ICD-10-CM | POA: Diagnosis not present

## 2014-08-24 DIAGNOSIS — M2550 Pain in unspecified joint: Secondary | ICD-10-CM | POA: Diagnosis not present

## 2014-09-08 DIAGNOSIS — J209 Acute bronchitis, unspecified: Secondary | ICD-10-CM | POA: Diagnosis not present

## 2014-09-25 DIAGNOSIS — N200 Calculus of kidney: Secondary | ICD-10-CM | POA: Diagnosis not present

## 2014-09-25 DIAGNOSIS — N401 Enlarged prostate with lower urinary tract symptoms: Secondary | ICD-10-CM | POA: Diagnosis not present

## 2014-09-25 DIAGNOSIS — N5201 Erectile dysfunction due to arterial insufficiency: Secondary | ICD-10-CM | POA: Diagnosis not present

## 2014-09-26 DIAGNOSIS — N209 Urinary calculus, unspecified: Secondary | ICD-10-CM | POA: Diagnosis not present

## 2014-09-26 DIAGNOSIS — I129 Hypertensive chronic kidney disease with stage 1 through stage 4 chronic kidney disease, or unspecified chronic kidney disease: Secondary | ICD-10-CM | POA: Diagnosis not present

## 2014-09-26 DIAGNOSIS — N183 Chronic kidney disease, stage 3 (moderate): Secondary | ICD-10-CM | POA: Diagnosis not present

## 2014-09-26 DIAGNOSIS — N189 Chronic kidney disease, unspecified: Secondary | ICD-10-CM | POA: Diagnosis not present

## 2014-10-04 DIAGNOSIS — M5136 Other intervertebral disc degeneration, lumbar region: Secondary | ICD-10-CM | POA: Diagnosis not present

## 2014-10-20 DIAGNOSIS — R05 Cough: Secondary | ICD-10-CM | POA: Diagnosis not present

## 2014-10-20 DIAGNOSIS — J0100 Acute maxillary sinusitis, unspecified: Secondary | ICD-10-CM | POA: Diagnosis not present

## 2014-11-06 DIAGNOSIS — E785 Hyperlipidemia, unspecified: Secondary | ICD-10-CM | POA: Diagnosis not present

## 2014-11-22 DIAGNOSIS — M79641 Pain in right hand: Secondary | ICD-10-CM | POA: Diagnosis not present

## 2014-11-22 DIAGNOSIS — M0579 Rheumatoid arthritis with rheumatoid factor of multiple sites without organ or systems involvement: Secondary | ICD-10-CM | POA: Diagnosis not present

## 2014-11-22 DIAGNOSIS — M109 Gout, unspecified: Secondary | ICD-10-CM | POA: Diagnosis not present

## 2014-11-22 DIAGNOSIS — M79642 Pain in left hand: Secondary | ICD-10-CM | POA: Diagnosis not present

## 2014-12-20 DIAGNOSIS — I129 Hypertensive chronic kidney disease with stage 1 through stage 4 chronic kidney disease, or unspecified chronic kidney disease: Secondary | ICD-10-CM | POA: Diagnosis not present

## 2014-12-20 DIAGNOSIS — N183 Chronic kidney disease, stage 3 (moderate): Secondary | ICD-10-CM | POA: Diagnosis not present

## 2015-01-11 DIAGNOSIS — R3911 Hesitancy of micturition: Secondary | ICD-10-CM | POA: Diagnosis not present

## 2015-01-11 DIAGNOSIS — R351 Nocturia: Secondary | ICD-10-CM | POA: Diagnosis not present

## 2015-01-11 DIAGNOSIS — N419 Inflammatory disease of prostate, unspecified: Secondary | ICD-10-CM | POA: Diagnosis not present

## 2015-01-11 DIAGNOSIS — W57XXXA Bitten or stung by nonvenomous insect and other nonvenomous arthropods, initial encounter: Secondary | ICD-10-CM | POA: Diagnosis not present

## 2015-01-24 DIAGNOSIS — M5136 Other intervertebral disc degeneration, lumbar region: Secondary | ICD-10-CM | POA: Diagnosis not present

## 2015-02-07 DIAGNOSIS — J0110 Acute frontal sinusitis, unspecified: Secondary | ICD-10-CM | POA: Diagnosis not present

## 2015-02-07 DIAGNOSIS — E785 Hyperlipidemia, unspecified: Secondary | ICD-10-CM | POA: Diagnosis not present

## 2015-02-23 DIAGNOSIS — M47816 Spondylosis without myelopathy or radiculopathy, lumbar region: Secondary | ICD-10-CM | POA: Diagnosis not present

## 2015-02-23 DIAGNOSIS — M545 Low back pain: Secondary | ICD-10-CM | POA: Diagnosis not present

## 2015-02-23 DIAGNOSIS — M5136 Other intervertebral disc degeneration, lumbar region: Secondary | ICD-10-CM | POA: Diagnosis not present

## 2015-02-27 DIAGNOSIS — L821 Other seborrheic keratosis: Secondary | ICD-10-CM | POA: Diagnosis not present

## 2015-02-27 DIAGNOSIS — L578 Other skin changes due to chronic exposure to nonionizing radiation: Secondary | ICD-10-CM | POA: Diagnosis not present

## 2015-02-27 DIAGNOSIS — L820 Inflamed seborrheic keratosis: Secondary | ICD-10-CM | POA: Diagnosis not present

## 2015-02-27 DIAGNOSIS — L570 Actinic keratosis: Secondary | ICD-10-CM | POA: Diagnosis not present

## 2015-03-06 DIAGNOSIS — M961 Postlaminectomy syndrome, not elsewhere classified: Secondary | ICD-10-CM | POA: Diagnosis not present

## 2015-03-06 DIAGNOSIS — M5136 Other intervertebral disc degeneration, lumbar region: Secondary | ICD-10-CM | POA: Diagnosis not present

## 2015-03-19 DIAGNOSIS — M5136 Other intervertebral disc degeneration, lumbar region: Secondary | ICD-10-CM | POA: Diagnosis not present

## 2015-03-22 DIAGNOSIS — M109 Gout, unspecified: Secondary | ICD-10-CM | POA: Diagnosis not present

## 2015-03-22 DIAGNOSIS — M0579 Rheumatoid arthritis with rheumatoid factor of multiple sites without organ or systems involvement: Secondary | ICD-10-CM | POA: Diagnosis not present

## 2015-04-11 DIAGNOSIS — N419 Inflammatory disease of prostate, unspecified: Secondary | ICD-10-CM | POA: Diagnosis not present

## 2015-04-13 DIAGNOSIS — M4150 Other secondary scoliosis, site unspecified: Secondary | ICD-10-CM | POA: Diagnosis not present

## 2015-04-13 DIAGNOSIS — M5136 Other intervertebral disc degeneration, lumbar region: Secondary | ICD-10-CM | POA: Diagnosis not present

## 2015-05-07 DIAGNOSIS — N400 Enlarged prostate without lower urinary tract symptoms: Secondary | ICD-10-CM | POA: Diagnosis not present

## 2015-05-07 DIAGNOSIS — I10 Essential (primary) hypertension: Secondary | ICD-10-CM | POA: Diagnosis not present

## 2015-05-07 DIAGNOSIS — E291 Testicular hypofunction: Secondary | ICD-10-CM | POA: Diagnosis not present

## 2015-05-07 DIAGNOSIS — E785 Hyperlipidemia, unspecified: Secondary | ICD-10-CM | POA: Diagnosis not present

## 2015-05-07 DIAGNOSIS — N529 Male erectile dysfunction, unspecified: Secondary | ICD-10-CM | POA: Diagnosis not present

## 2015-05-09 DIAGNOSIS — N138 Other obstructive and reflux uropathy: Secondary | ICD-10-CM | POA: Diagnosis not present

## 2015-05-09 DIAGNOSIS — E291 Testicular hypofunction: Secondary | ICD-10-CM | POA: Diagnosis not present

## 2015-05-09 DIAGNOSIS — N401 Enlarged prostate with lower urinary tract symptoms: Secondary | ICD-10-CM | POA: Diagnosis not present

## 2015-05-09 DIAGNOSIS — N5201 Erectile dysfunction due to arterial insufficiency: Secondary | ICD-10-CM | POA: Diagnosis not present

## 2015-05-23 DIAGNOSIS — M5136 Other intervertebral disc degeneration, lumbar region: Secondary | ICD-10-CM | POA: Diagnosis not present

## 2015-05-29 DIAGNOSIS — N410 Acute prostatitis: Secondary | ICD-10-CM | POA: Diagnosis not present

## 2015-06-21 DIAGNOSIS — R300 Dysuria: Secondary | ICD-10-CM | POA: Diagnosis not present

## 2015-06-21 DIAGNOSIS — N5201 Erectile dysfunction due to arterial insufficiency: Secondary | ICD-10-CM | POA: Diagnosis not present

## 2015-06-25 DIAGNOSIS — J0110 Acute frontal sinusitis, unspecified: Secondary | ICD-10-CM | POA: Diagnosis not present

## 2015-06-28 DIAGNOSIS — Z79899 Other long term (current) drug therapy: Secondary | ICD-10-CM | POA: Diagnosis not present

## 2015-06-28 DIAGNOSIS — G894 Chronic pain syndrome: Secondary | ICD-10-CM | POA: Diagnosis not present

## 2015-06-28 DIAGNOSIS — M069 Rheumatoid arthritis, unspecified: Secondary | ICD-10-CM | POA: Diagnosis not present

## 2015-06-28 DIAGNOSIS — M109 Gout, unspecified: Secondary | ICD-10-CM | POA: Diagnosis not present

## 2015-06-28 DIAGNOSIS — M961 Postlaminectomy syndrome, not elsewhere classified: Secondary | ICD-10-CM | POA: Diagnosis not present

## 2015-07-12 DIAGNOSIS — Z23 Encounter for immunization: Secondary | ICD-10-CM | POA: Diagnosis not present

## 2015-07-19 DIAGNOSIS — H11423 Conjunctival edema, bilateral: Secondary | ICD-10-CM | POA: Diagnosis not present

## 2015-07-19 DIAGNOSIS — H5203 Hypermetropia, bilateral: Secondary | ICD-10-CM | POA: Diagnosis not present

## 2015-07-19 DIAGNOSIS — H35722 Serous detachment of retinal pigment epithelium, left eye: Secondary | ICD-10-CM | POA: Diagnosis not present

## 2015-07-19 DIAGNOSIS — M109 Gout, unspecified: Secondary | ICD-10-CM | POA: Diagnosis not present

## 2015-07-19 DIAGNOSIS — H18413 Arcus senilis, bilateral: Secondary | ICD-10-CM | POA: Diagnosis not present

## 2015-07-19 DIAGNOSIS — H52223 Regular astigmatism, bilateral: Secondary | ICD-10-CM | POA: Diagnosis not present

## 2015-07-19 DIAGNOSIS — G894 Chronic pain syndrome: Secondary | ICD-10-CM | POA: Diagnosis not present

## 2015-07-19 DIAGNOSIS — M961 Postlaminectomy syndrome, not elsewhere classified: Secondary | ICD-10-CM | POA: Diagnosis not present

## 2015-07-19 DIAGNOSIS — H1045 Other chronic allergic conjunctivitis: Secondary | ICD-10-CM | POA: Diagnosis not present

## 2015-07-19 DIAGNOSIS — H25013 Cortical age-related cataract, bilateral: Secondary | ICD-10-CM | POA: Diagnosis not present

## 2015-07-19 DIAGNOSIS — M069 Rheumatoid arthritis, unspecified: Secondary | ICD-10-CM | POA: Diagnosis not present

## 2015-07-19 DIAGNOSIS — H524 Presbyopia: Secondary | ICD-10-CM | POA: Diagnosis not present

## 2015-07-19 DIAGNOSIS — H2513 Age-related nuclear cataract, bilateral: Secondary | ICD-10-CM | POA: Diagnosis not present

## 2015-07-19 DIAGNOSIS — H25043 Posterior subcapsular polar age-related cataract, bilateral: Secondary | ICD-10-CM | POA: Diagnosis not present

## 2015-08-01 DIAGNOSIS — M5416 Radiculopathy, lumbar region: Secondary | ICD-10-CM | POA: Diagnosis not present

## 2015-08-03 DIAGNOSIS — Z1389 Encounter for screening for other disorder: Secondary | ICD-10-CM | POA: Diagnosis not present

## 2015-08-03 DIAGNOSIS — N400 Enlarged prostate without lower urinary tract symptoms: Secondary | ICD-10-CM | POA: Diagnosis not present

## 2015-08-03 DIAGNOSIS — K219 Gastro-esophageal reflux disease without esophagitis: Secondary | ICD-10-CM | POA: Diagnosis not present

## 2015-08-03 DIAGNOSIS — E7800 Pure hypercholesterolemia, unspecified: Secondary | ICD-10-CM | POA: Diagnosis not present

## 2015-08-03 DIAGNOSIS — J4520 Mild intermittent asthma, uncomplicated: Secondary | ICD-10-CM | POA: Diagnosis not present

## 2015-08-03 DIAGNOSIS — N183 Chronic kidney disease, stage 3 (moderate): Secondary | ICD-10-CM | POA: Diagnosis not present

## 2015-08-03 DIAGNOSIS — E291 Testicular hypofunction: Secondary | ICD-10-CM | POA: Diagnosis not present

## 2015-08-03 DIAGNOSIS — I10 Essential (primary) hypertension: Secondary | ICD-10-CM | POA: Diagnosis not present

## 2015-08-03 DIAGNOSIS — M1A0710 Idiopathic chronic gout, right ankle and foot, without tophus (tophi): Secondary | ICD-10-CM | POA: Diagnosis not present

## 2015-08-03 DIAGNOSIS — I5032 Chronic diastolic (congestive) heart failure: Secondary | ICD-10-CM | POA: Diagnosis not present

## 2015-08-03 DIAGNOSIS — E559 Vitamin D deficiency, unspecified: Secondary | ICD-10-CM | POA: Diagnosis not present

## 2015-08-03 DIAGNOSIS — K589 Irritable bowel syndrome without diarrhea: Secondary | ICD-10-CM | POA: Diagnosis not present

## 2015-08-06 ENCOUNTER — Encounter (INDEPENDENT_AMBULATORY_CARE_PROVIDER_SITE_OTHER): Payer: Medicare Other | Admitting: Ophthalmology

## 2015-08-06 DIAGNOSIS — I10 Essential (primary) hypertension: Secondary | ICD-10-CM

## 2015-08-06 DIAGNOSIS — H353132 Nonexudative age-related macular degeneration, bilateral, intermediate dry stage: Secondary | ICD-10-CM

## 2015-08-06 DIAGNOSIS — H35033 Hypertensive retinopathy, bilateral: Secondary | ICD-10-CM | POA: Diagnosis not present

## 2015-08-06 DIAGNOSIS — H2513 Age-related nuclear cataract, bilateral: Secondary | ICD-10-CM

## 2015-08-06 DIAGNOSIS — H43813 Vitreous degeneration, bilateral: Secondary | ICD-10-CM

## 2015-08-06 DIAGNOSIS — D3132 Benign neoplasm of left choroid: Secondary | ICD-10-CM | POA: Diagnosis not present

## 2015-08-14 DIAGNOSIS — N411 Chronic prostatitis: Secondary | ICD-10-CM | POA: Diagnosis not present

## 2015-08-14 DIAGNOSIS — R300 Dysuria: Secondary | ICD-10-CM | POA: Diagnosis not present

## 2015-08-16 DIAGNOSIS — M961 Postlaminectomy syndrome, not elsewhere classified: Secondary | ICD-10-CM | POA: Diagnosis not present

## 2015-08-16 DIAGNOSIS — G894 Chronic pain syndrome: Secondary | ICD-10-CM | POA: Diagnosis not present

## 2015-08-16 DIAGNOSIS — Z79899 Other long term (current) drug therapy: Secondary | ICD-10-CM | POA: Diagnosis not present

## 2015-08-16 DIAGNOSIS — M109 Gout, unspecified: Secondary | ICD-10-CM | POA: Diagnosis not present

## 2015-08-16 DIAGNOSIS — M545 Low back pain: Secondary | ICD-10-CM | POA: Diagnosis not present

## 2015-08-20 ENCOUNTER — Other Ambulatory Visit: Payer: Self-pay | Admitting: Pain Medicine

## 2015-08-20 DIAGNOSIS — M25512 Pain in left shoulder: Secondary | ICD-10-CM

## 2015-09-06 DIAGNOSIS — M4806 Spinal stenosis, lumbar region: Secondary | ICD-10-CM | POA: Diagnosis not present

## 2015-09-06 DIAGNOSIS — M4156 Other secondary scoliosis, lumbar region: Secondary | ICD-10-CM | POA: Diagnosis not present

## 2015-09-06 DIAGNOSIS — M961 Postlaminectomy syndrome, not elsewhere classified: Secondary | ICD-10-CM | POA: Diagnosis not present

## 2015-09-06 DIAGNOSIS — M5136 Other intervertebral disc degeneration, lumbar region: Secondary | ICD-10-CM | POA: Diagnosis not present

## 2015-09-10 ENCOUNTER — Encounter (INDEPENDENT_AMBULATORY_CARE_PROVIDER_SITE_OTHER): Payer: Medicare Other | Admitting: Ophthalmology

## 2015-09-10 DIAGNOSIS — H43813 Vitreous degeneration, bilateral: Secondary | ICD-10-CM

## 2015-09-10 DIAGNOSIS — H35033 Hypertensive retinopathy, bilateral: Secondary | ICD-10-CM

## 2015-09-10 DIAGNOSIS — H353221 Exudative age-related macular degeneration, left eye, with active choroidal neovascularization: Secondary | ICD-10-CM

## 2015-09-10 DIAGNOSIS — H353112 Nonexudative age-related macular degeneration, right eye, intermediate dry stage: Secondary | ICD-10-CM | POA: Diagnosis not present

## 2015-09-10 DIAGNOSIS — D3132 Benign neoplasm of left choroid: Secondary | ICD-10-CM

## 2015-09-10 DIAGNOSIS — I10 Essential (primary) hypertension: Secondary | ICD-10-CM | POA: Diagnosis not present

## 2015-09-24 DIAGNOSIS — J00 Acute nasopharyngitis [common cold]: Secondary | ICD-10-CM | POA: Diagnosis not present

## 2015-09-24 DIAGNOSIS — J209 Acute bronchitis, unspecified: Secondary | ICD-10-CM | POA: Diagnosis not present

## 2015-09-26 ENCOUNTER — Encounter (INDEPENDENT_AMBULATORY_CARE_PROVIDER_SITE_OTHER): Payer: Medicare Other | Admitting: Ophthalmology

## 2015-09-26 DIAGNOSIS — H353221 Exudative age-related macular degeneration, left eye, with active choroidal neovascularization: Secondary | ICD-10-CM

## 2015-10-02 DIAGNOSIS — M5136 Other intervertebral disc degeneration, lumbar region: Secondary | ICD-10-CM | POA: Diagnosis not present

## 2015-10-05 DIAGNOSIS — J0101 Acute recurrent maxillary sinusitis: Secondary | ICD-10-CM | POA: Diagnosis not present

## 2015-10-23 DIAGNOSIS — N183 Chronic kidney disease, stage 3 (moderate): Secondary | ICD-10-CM | POA: Diagnosis not present

## 2015-10-23 DIAGNOSIS — I129 Hypertensive chronic kidney disease with stage 1 through stage 4 chronic kidney disease, or unspecified chronic kidney disease: Secondary | ICD-10-CM | POA: Diagnosis not present

## 2015-10-25 DIAGNOSIS — N183 Chronic kidney disease, stage 3 (moderate): Secondary | ICD-10-CM | POA: Diagnosis not present

## 2015-10-25 DIAGNOSIS — I10 Essential (primary) hypertension: Secondary | ICD-10-CM | POA: Diagnosis not present

## 2015-10-25 DIAGNOSIS — E7800 Pure hypercholesterolemia, unspecified: Secondary | ICD-10-CM | POA: Diagnosis not present

## 2015-10-25 DIAGNOSIS — R300 Dysuria: Secondary | ICD-10-CM | POA: Diagnosis not present

## 2015-10-31 ENCOUNTER — Encounter (INDEPENDENT_AMBULATORY_CARE_PROVIDER_SITE_OTHER): Payer: Medicare Other | Admitting: Ophthalmology

## 2015-10-31 DIAGNOSIS — H43813 Vitreous degeneration, bilateral: Secondary | ICD-10-CM

## 2015-10-31 DIAGNOSIS — H2513 Age-related nuclear cataract, bilateral: Secondary | ICD-10-CM

## 2015-10-31 DIAGNOSIS — H353221 Exudative age-related macular degeneration, left eye, with active choroidal neovascularization: Secondary | ICD-10-CM

## 2015-10-31 DIAGNOSIS — H35033 Hypertensive retinopathy, bilateral: Secondary | ICD-10-CM

## 2015-10-31 DIAGNOSIS — H353112 Nonexudative age-related macular degeneration, right eye, intermediate dry stage: Secondary | ICD-10-CM

## 2015-10-31 DIAGNOSIS — I10 Essential (primary) hypertension: Secondary | ICD-10-CM

## 2015-11-12 DIAGNOSIS — N5201 Erectile dysfunction due to arterial insufficiency: Secondary | ICD-10-CM | POA: Diagnosis not present

## 2015-11-12 DIAGNOSIS — N138 Other obstructive and reflux uropathy: Secondary | ICD-10-CM | POA: Diagnosis not present

## 2015-11-12 DIAGNOSIS — Z0000 Encounter for general adult medical examination without abnormal findings: Secondary | ICD-10-CM | POA: Diagnosis not present

## 2015-11-12 DIAGNOSIS — N200 Calculus of kidney: Secondary | ICD-10-CM | POA: Diagnosis not present

## 2015-11-12 DIAGNOSIS — E291 Testicular hypofunction: Secondary | ICD-10-CM | POA: Diagnosis not present

## 2015-11-12 DIAGNOSIS — N401 Enlarged prostate with lower urinary tract symptoms: Secondary | ICD-10-CM | POA: Diagnosis not present

## 2015-11-28 ENCOUNTER — Encounter (INDEPENDENT_AMBULATORY_CARE_PROVIDER_SITE_OTHER): Payer: Medicare Other | Admitting: Ophthalmology

## 2015-11-28 DIAGNOSIS — H353111 Nonexudative age-related macular degeneration, right eye, early dry stage: Secondary | ICD-10-CM | POA: Diagnosis not present

## 2015-11-28 DIAGNOSIS — H35033 Hypertensive retinopathy, bilateral: Secondary | ICD-10-CM | POA: Diagnosis not present

## 2015-11-28 DIAGNOSIS — H2513 Age-related nuclear cataract, bilateral: Secondary | ICD-10-CM

## 2015-11-28 DIAGNOSIS — H43813 Vitreous degeneration, bilateral: Secondary | ICD-10-CM

## 2015-11-28 DIAGNOSIS — H353121 Nonexudative age-related macular degeneration, left eye, early dry stage: Secondary | ICD-10-CM

## 2015-11-28 DIAGNOSIS — I10 Essential (primary) hypertension: Secondary | ICD-10-CM

## 2015-11-28 DIAGNOSIS — D3132 Benign neoplasm of left choroid: Secondary | ICD-10-CM | POA: Diagnosis not present

## 2015-12-18 DIAGNOSIS — E291 Testicular hypofunction: Secondary | ICD-10-CM | POA: Diagnosis not present

## 2015-12-24 DIAGNOSIS — E291 Testicular hypofunction: Secondary | ICD-10-CM | POA: Diagnosis not present

## 2015-12-24 DIAGNOSIS — N5201 Erectile dysfunction due to arterial insufficiency: Secondary | ICD-10-CM | POA: Diagnosis not present

## 2015-12-31 ENCOUNTER — Encounter (INDEPENDENT_AMBULATORY_CARE_PROVIDER_SITE_OTHER): Payer: Medicare Other | Admitting: Ophthalmology

## 2015-12-31 DIAGNOSIS — H353111 Nonexudative age-related macular degeneration, right eye, early dry stage: Secondary | ICD-10-CM | POA: Diagnosis not present

## 2015-12-31 DIAGNOSIS — D3132 Benign neoplasm of left choroid: Secondary | ICD-10-CM

## 2015-12-31 DIAGNOSIS — H353221 Exudative age-related macular degeneration, left eye, with active choroidal neovascularization: Secondary | ICD-10-CM

## 2015-12-31 DIAGNOSIS — H43813 Vitreous degeneration, bilateral: Secondary | ICD-10-CM | POA: Diagnosis not present

## 2015-12-31 DIAGNOSIS — H2513 Age-related nuclear cataract, bilateral: Secondary | ICD-10-CM | POA: Diagnosis not present

## 2015-12-31 DIAGNOSIS — I10 Essential (primary) hypertension: Secondary | ICD-10-CM | POA: Diagnosis not present

## 2015-12-31 DIAGNOSIS — H35033 Hypertensive retinopathy, bilateral: Secondary | ICD-10-CM

## 2016-01-01 DIAGNOSIS — M5136 Other intervertebral disc degeneration, lumbar region: Secondary | ICD-10-CM | POA: Diagnosis not present

## 2016-01-11 DIAGNOSIS — M069 Rheumatoid arthritis, unspecified: Secondary | ICD-10-CM | POA: Diagnosis not present

## 2016-01-11 DIAGNOSIS — R300 Dysuria: Secondary | ICD-10-CM | POA: Diagnosis not present

## 2016-01-31 ENCOUNTER — Encounter (INDEPENDENT_AMBULATORY_CARE_PROVIDER_SITE_OTHER): Payer: Medicare Other | Admitting: Ophthalmology

## 2016-01-31 DIAGNOSIS — H353111 Nonexudative age-related macular degeneration, right eye, early dry stage: Secondary | ICD-10-CM | POA: Diagnosis not present

## 2016-01-31 DIAGNOSIS — H43813 Vitreous degeneration, bilateral: Secondary | ICD-10-CM | POA: Diagnosis not present

## 2016-01-31 DIAGNOSIS — I10 Essential (primary) hypertension: Secondary | ICD-10-CM | POA: Diagnosis not present

## 2016-01-31 DIAGNOSIS — H353221 Exudative age-related macular degeneration, left eye, with active choroidal neovascularization: Secondary | ICD-10-CM

## 2016-01-31 DIAGNOSIS — D3132 Benign neoplasm of left choroid: Secondary | ICD-10-CM

## 2016-01-31 DIAGNOSIS — H35033 Hypertensive retinopathy, bilateral: Secondary | ICD-10-CM | POA: Diagnosis not present

## 2016-03-13 ENCOUNTER — Encounter (INDEPENDENT_AMBULATORY_CARE_PROVIDER_SITE_OTHER): Payer: Medicare Other | Admitting: Ophthalmology

## 2016-03-13 DIAGNOSIS — H35033 Hypertensive retinopathy, bilateral: Secondary | ICD-10-CM

## 2016-03-13 DIAGNOSIS — D3132 Benign neoplasm of left choroid: Secondary | ICD-10-CM | POA: Diagnosis not present

## 2016-03-13 DIAGNOSIS — H43813 Vitreous degeneration, bilateral: Secondary | ICD-10-CM | POA: Diagnosis not present

## 2016-03-13 DIAGNOSIS — H353221 Exudative age-related macular degeneration, left eye, with active choroidal neovascularization: Secondary | ICD-10-CM

## 2016-03-13 DIAGNOSIS — H353111 Nonexudative age-related macular degeneration, right eye, early dry stage: Secondary | ICD-10-CM | POA: Diagnosis not present

## 2016-03-13 DIAGNOSIS — H2513 Age-related nuclear cataract, bilateral: Secondary | ICD-10-CM | POA: Diagnosis not present

## 2016-03-13 DIAGNOSIS — I10 Essential (primary) hypertension: Secondary | ICD-10-CM | POA: Diagnosis not present

## 2016-03-25 DIAGNOSIS — M5136 Other intervertebral disc degeneration, lumbar region: Secondary | ICD-10-CM | POA: Diagnosis not present

## 2016-04-16 DIAGNOSIS — I10 Essential (primary) hypertension: Secondary | ICD-10-CM | POA: Diagnosis not present

## 2016-04-16 DIAGNOSIS — N410 Acute prostatitis: Secondary | ICD-10-CM | POA: Diagnosis not present

## 2016-04-24 ENCOUNTER — Encounter (INDEPENDENT_AMBULATORY_CARE_PROVIDER_SITE_OTHER): Payer: Medicare Other | Admitting: Ophthalmology

## 2016-04-24 DIAGNOSIS — I10 Essential (primary) hypertension: Secondary | ICD-10-CM

## 2016-04-24 DIAGNOSIS — H353221 Exudative age-related macular degeneration, left eye, with active choroidal neovascularization: Secondary | ICD-10-CM

## 2016-04-24 DIAGNOSIS — H43813 Vitreous degeneration, bilateral: Secondary | ICD-10-CM | POA: Diagnosis not present

## 2016-04-24 DIAGNOSIS — H353111 Nonexudative age-related macular degeneration, right eye, early dry stage: Secondary | ICD-10-CM | POA: Diagnosis not present

## 2016-04-24 DIAGNOSIS — D3132 Benign neoplasm of left choroid: Secondary | ICD-10-CM

## 2016-04-24 DIAGNOSIS — H35033 Hypertensive retinopathy, bilateral: Secondary | ICD-10-CM | POA: Diagnosis not present

## 2016-04-24 DIAGNOSIS — H2513 Age-related nuclear cataract, bilateral: Secondary | ICD-10-CM

## 2016-05-06 DIAGNOSIS — N183 Chronic kidney disease, stage 3 (moderate): Secondary | ICD-10-CM | POA: Diagnosis not present

## 2016-05-06 DIAGNOSIS — I129 Hypertensive chronic kidney disease with stage 1 through stage 4 chronic kidney disease, or unspecified chronic kidney disease: Secondary | ICD-10-CM | POA: Diagnosis not present

## 2016-05-06 DIAGNOSIS — Z6831 Body mass index (BMI) 31.0-31.9, adult: Secondary | ICD-10-CM | POA: Diagnosis not present

## 2016-05-22 ENCOUNTER — Encounter (INDEPENDENT_AMBULATORY_CARE_PROVIDER_SITE_OTHER): Payer: Medicare Other | Admitting: Ophthalmology

## 2016-05-22 DIAGNOSIS — I10 Essential (primary) hypertension: Secondary | ICD-10-CM

## 2016-05-22 DIAGNOSIS — H353111 Nonexudative age-related macular degeneration, right eye, early dry stage: Secondary | ICD-10-CM

## 2016-05-22 DIAGNOSIS — H353221 Exudative age-related macular degeneration, left eye, with active choroidal neovascularization: Secondary | ICD-10-CM | POA: Diagnosis not present

## 2016-05-22 DIAGNOSIS — H35033 Hypertensive retinopathy, bilateral: Secondary | ICD-10-CM

## 2016-05-22 DIAGNOSIS — H43813 Vitreous degeneration, bilateral: Secondary | ICD-10-CM | POA: Diagnosis not present

## 2016-05-22 DIAGNOSIS — D3132 Benign neoplasm of left choroid: Secondary | ICD-10-CM | POA: Diagnosis not present

## 2016-06-19 DIAGNOSIS — L578 Other skin changes due to chronic exposure to nonionizing radiation: Secondary | ICD-10-CM | POA: Diagnosis not present

## 2016-06-19 DIAGNOSIS — L821 Other seborrheic keratosis: Secondary | ICD-10-CM | POA: Diagnosis not present

## 2016-06-19 DIAGNOSIS — L570 Actinic keratosis: Secondary | ICD-10-CM | POA: Diagnosis not present

## 2016-06-19 DIAGNOSIS — L601 Onycholysis: Secondary | ICD-10-CM | POA: Diagnosis not present

## 2016-06-20 DIAGNOSIS — N401 Enlarged prostate with lower urinary tract symptoms: Secondary | ICD-10-CM | POA: Diagnosis not present

## 2016-06-20 DIAGNOSIS — E291 Testicular hypofunction: Secondary | ICD-10-CM | POA: Diagnosis not present

## 2016-06-24 DIAGNOSIS — M5136 Other intervertebral disc degeneration, lumbar region: Secondary | ICD-10-CM | POA: Diagnosis not present

## 2016-06-27 DIAGNOSIS — E291 Testicular hypofunction: Secondary | ICD-10-CM | POA: Diagnosis not present

## 2016-06-27 DIAGNOSIS — R351 Nocturia: Secondary | ICD-10-CM | POA: Diagnosis not present

## 2016-06-27 DIAGNOSIS — N401 Enlarged prostate with lower urinary tract symptoms: Secondary | ICD-10-CM | POA: Diagnosis not present

## 2016-06-27 DIAGNOSIS — N5201 Erectile dysfunction due to arterial insufficiency: Secondary | ICD-10-CM | POA: Diagnosis not present

## 2016-07-04 ENCOUNTER — Encounter (INDEPENDENT_AMBULATORY_CARE_PROVIDER_SITE_OTHER): Payer: Medicare Other | Admitting: Ophthalmology

## 2016-07-04 DIAGNOSIS — H353221 Exudative age-related macular degeneration, left eye, with active choroidal neovascularization: Secondary | ICD-10-CM | POA: Diagnosis not present

## 2016-07-04 DIAGNOSIS — I10 Essential (primary) hypertension: Secondary | ICD-10-CM

## 2016-07-04 DIAGNOSIS — H43813 Vitreous degeneration, bilateral: Secondary | ICD-10-CM | POA: Diagnosis not present

## 2016-07-04 DIAGNOSIS — H35033 Hypertensive retinopathy, bilateral: Secondary | ICD-10-CM | POA: Diagnosis not present

## 2016-07-04 DIAGNOSIS — H353111 Nonexudative age-related macular degeneration, right eye, early dry stage: Secondary | ICD-10-CM | POA: Diagnosis not present

## 2016-07-04 DIAGNOSIS — D3132 Benign neoplasm of left choroid: Secondary | ICD-10-CM

## 2016-07-04 DIAGNOSIS — N3000 Acute cystitis without hematuria: Secondary | ICD-10-CM | POA: Diagnosis not present

## 2016-07-11 DIAGNOSIS — J209 Acute bronchitis, unspecified: Secondary | ICD-10-CM | POA: Diagnosis not present

## 2016-08-04 DIAGNOSIS — Z23 Encounter for immunization: Secondary | ICD-10-CM | POA: Diagnosis not present

## 2016-08-11 DIAGNOSIS — N400 Enlarged prostate without lower urinary tract symptoms: Secondary | ICD-10-CM | POA: Diagnosis not present

## 2016-08-11 DIAGNOSIS — E7800 Pure hypercholesterolemia, unspecified: Secondary | ICD-10-CM | POA: Diagnosis not present

## 2016-08-11 DIAGNOSIS — M069 Rheumatoid arthritis, unspecified: Secondary | ICD-10-CM | POA: Diagnosis not present

## 2016-08-11 DIAGNOSIS — D510 Vitamin B12 deficiency anemia due to intrinsic factor deficiency: Secondary | ICD-10-CM | POA: Diagnosis not present

## 2016-08-11 DIAGNOSIS — N183 Chronic kidney disease, stage 3 (moderate): Secondary | ICD-10-CM | POA: Diagnosis not present

## 2016-08-11 DIAGNOSIS — K219 Gastro-esophageal reflux disease without esophagitis: Secondary | ICD-10-CM | POA: Diagnosis not present

## 2016-08-11 DIAGNOSIS — J4520 Mild intermittent asthma, uncomplicated: Secondary | ICD-10-CM | POA: Diagnosis not present

## 2016-08-11 DIAGNOSIS — R5383 Other fatigue: Secondary | ICD-10-CM | POA: Diagnosis not present

## 2016-08-11 DIAGNOSIS — K589 Irritable bowel syndrome without diarrhea: Secondary | ICD-10-CM | POA: Diagnosis not present

## 2016-08-11 DIAGNOSIS — I519 Heart disease, unspecified: Secondary | ICD-10-CM | POA: Diagnosis not present

## 2016-08-11 DIAGNOSIS — Z1389 Encounter for screening for other disorder: Secondary | ICD-10-CM | POA: Diagnosis not present

## 2016-08-11 DIAGNOSIS — I10 Essential (primary) hypertension: Secondary | ICD-10-CM | POA: Diagnosis not present

## 2016-08-11 DIAGNOSIS — M109 Gout, unspecified: Secondary | ICD-10-CM | POA: Diagnosis not present

## 2016-08-11 DIAGNOSIS — E291 Testicular hypofunction: Secondary | ICD-10-CM | POA: Diagnosis not present

## 2016-08-11 DIAGNOSIS — E559 Vitamin D deficiency, unspecified: Secondary | ICD-10-CM | POA: Diagnosis not present

## 2016-08-13 ENCOUNTER — Encounter (INDEPENDENT_AMBULATORY_CARE_PROVIDER_SITE_OTHER): Payer: Medicare Other | Admitting: Ophthalmology

## 2016-08-13 DIAGNOSIS — D3132 Benign neoplasm of left choroid: Secondary | ICD-10-CM | POA: Diagnosis not present

## 2016-08-13 DIAGNOSIS — H43813 Vitreous degeneration, bilateral: Secondary | ICD-10-CM

## 2016-08-13 DIAGNOSIS — H353221 Exudative age-related macular degeneration, left eye, with active choroidal neovascularization: Secondary | ICD-10-CM

## 2016-08-13 DIAGNOSIS — H35033 Hypertensive retinopathy, bilateral: Secondary | ICD-10-CM | POA: Diagnosis not present

## 2016-08-13 DIAGNOSIS — H353111 Nonexudative age-related macular degeneration, right eye, early dry stage: Secondary | ICD-10-CM

## 2016-08-13 DIAGNOSIS — I10 Essential (primary) hypertension: Secondary | ICD-10-CM

## 2016-09-26 DIAGNOSIS — R5383 Other fatigue: Secondary | ICD-10-CM | POA: Diagnosis not present

## 2016-09-26 DIAGNOSIS — H6690 Otitis media, unspecified, unspecified ear: Secondary | ICD-10-CM | POA: Diagnosis not present

## 2016-10-09 ENCOUNTER — Encounter (INDEPENDENT_AMBULATORY_CARE_PROVIDER_SITE_OTHER): Payer: Medicare Other | Admitting: Ophthalmology

## 2016-10-13 DIAGNOSIS — B351 Tinea unguium: Secondary | ICD-10-CM | POA: Diagnosis not present

## 2016-10-13 DIAGNOSIS — J0100 Acute maxillary sinusitis, unspecified: Secondary | ICD-10-CM | POA: Diagnosis not present

## 2016-10-17 ENCOUNTER — Encounter (INDEPENDENT_AMBULATORY_CARE_PROVIDER_SITE_OTHER): Payer: Medicare Other | Admitting: Ophthalmology

## 2016-10-20 DIAGNOSIS — J209 Acute bronchitis, unspecified: Secondary | ICD-10-CM | POA: Diagnosis not present

## 2016-10-20 DIAGNOSIS — J45901 Unspecified asthma with (acute) exacerbation: Secondary | ICD-10-CM | POA: Diagnosis not present

## 2016-11-07 ENCOUNTER — Encounter (INDEPENDENT_AMBULATORY_CARE_PROVIDER_SITE_OTHER): Payer: Medicare Other | Admitting: Ophthalmology

## 2016-11-07 DIAGNOSIS — H353221 Exudative age-related macular degeneration, left eye, with active choroidal neovascularization: Secondary | ICD-10-CM

## 2016-11-07 DIAGNOSIS — I10 Essential (primary) hypertension: Secondary | ICD-10-CM | POA: Diagnosis not present

## 2016-11-07 DIAGNOSIS — H35033 Hypertensive retinopathy, bilateral: Secondary | ICD-10-CM

## 2016-11-07 DIAGNOSIS — H2513 Age-related nuclear cataract, bilateral: Secondary | ICD-10-CM

## 2016-11-07 DIAGNOSIS — H43813 Vitreous degeneration, bilateral: Secondary | ICD-10-CM

## 2016-11-07 DIAGNOSIS — H353111 Nonexudative age-related macular degeneration, right eye, early dry stage: Secondary | ICD-10-CM | POA: Diagnosis not present

## 2016-11-07 DIAGNOSIS — D3132 Benign neoplasm of left choroid: Secondary | ICD-10-CM

## 2016-11-26 DIAGNOSIS — M5136 Other intervertebral disc degeneration, lumbar region: Secondary | ICD-10-CM | POA: Diagnosis not present

## 2016-12-02 DIAGNOSIS — L570 Actinic keratosis: Secondary | ICD-10-CM | POA: Diagnosis not present

## 2016-12-02 DIAGNOSIS — L820 Inflamed seborrheic keratosis: Secondary | ICD-10-CM | POA: Diagnosis not present

## 2016-12-02 DIAGNOSIS — L578 Other skin changes due to chronic exposure to nonionizing radiation: Secondary | ICD-10-CM | POA: Diagnosis not present

## 2016-12-02 DIAGNOSIS — L821 Other seborrheic keratosis: Secondary | ICD-10-CM | POA: Diagnosis not present

## 2016-12-10 DIAGNOSIS — Z96651 Presence of right artificial knee joint: Secondary | ICD-10-CM | POA: Diagnosis not present

## 2016-12-10 DIAGNOSIS — Z471 Aftercare following joint replacement surgery: Secondary | ICD-10-CM | POA: Diagnosis not present

## 2016-12-31 DIAGNOSIS — R109 Unspecified abdominal pain: Secondary | ICD-10-CM | POA: Diagnosis not present

## 2016-12-31 DIAGNOSIS — R300 Dysuria: Secondary | ICD-10-CM | POA: Diagnosis not present

## 2016-12-31 DIAGNOSIS — R1084 Generalized abdominal pain: Secondary | ICD-10-CM | POA: Diagnosis not present

## 2016-12-31 DIAGNOSIS — R319 Hematuria, unspecified: Secondary | ICD-10-CM | POA: Diagnosis not present

## 2017-01-08 DIAGNOSIS — Z471 Aftercare following joint replacement surgery: Secondary | ICD-10-CM | POA: Diagnosis not present

## 2017-01-08 DIAGNOSIS — Z96651 Presence of right artificial knee joint: Secondary | ICD-10-CM | POA: Diagnosis not present

## 2017-01-15 ENCOUNTER — Encounter (INDEPENDENT_AMBULATORY_CARE_PROVIDER_SITE_OTHER): Payer: Medicare Other | Admitting: Ophthalmology

## 2017-01-15 DIAGNOSIS — H43813 Vitreous degeneration, bilateral: Secondary | ICD-10-CM

## 2017-01-15 DIAGNOSIS — D3132 Benign neoplasm of left choroid: Secondary | ICD-10-CM

## 2017-01-15 DIAGNOSIS — I10 Essential (primary) hypertension: Secondary | ICD-10-CM

## 2017-01-15 DIAGNOSIS — H35033 Hypertensive retinopathy, bilateral: Secondary | ICD-10-CM

## 2017-01-15 DIAGNOSIS — H353111 Nonexudative age-related macular degeneration, right eye, early dry stage: Secondary | ICD-10-CM | POA: Diagnosis not present

## 2017-01-15 DIAGNOSIS — H353221 Exudative age-related macular degeneration, left eye, with active choroidal neovascularization: Secondary | ICD-10-CM | POA: Diagnosis not present

## 2017-01-19 DIAGNOSIS — B351 Tinea unguium: Secondary | ICD-10-CM | POA: Diagnosis not present

## 2017-01-19 DIAGNOSIS — M069 Rheumatoid arthritis, unspecified: Secondary | ICD-10-CM | POA: Diagnosis not present

## 2017-02-04 DIAGNOSIS — N401 Enlarged prostate with lower urinary tract symptoms: Secondary | ICD-10-CM | POA: Diagnosis not present

## 2017-02-05 DIAGNOSIS — J0100 Acute maxillary sinusitis, unspecified: Secondary | ICD-10-CM | POA: Diagnosis not present

## 2017-02-11 DIAGNOSIS — E291 Testicular hypofunction: Secondary | ICD-10-CM | POA: Diagnosis not present

## 2017-02-18 DIAGNOSIS — M5136 Other intervertebral disc degeneration, lumbar region: Secondary | ICD-10-CM | POA: Diagnosis not present

## 2017-03-17 DIAGNOSIS — M0579 Rheumatoid arthritis with rheumatoid factor of multiple sites without organ or systems involvement: Secondary | ICD-10-CM | POA: Diagnosis not present

## 2017-03-17 DIAGNOSIS — R5383 Other fatigue: Secondary | ICD-10-CM | POA: Diagnosis not present

## 2017-03-17 DIAGNOSIS — M7989 Other specified soft tissue disorders: Secondary | ICD-10-CM | POA: Diagnosis not present

## 2017-03-17 DIAGNOSIS — M1A09X1 Idiopathic chronic gout, multiple sites, with tophus (tophi): Secondary | ICD-10-CM | POA: Diagnosis not present

## 2017-03-17 DIAGNOSIS — E663 Overweight: Secondary | ICD-10-CM | POA: Diagnosis not present

## 2017-03-17 DIAGNOSIS — M150 Primary generalized (osteo)arthritis: Secondary | ICD-10-CM | POA: Diagnosis not present

## 2017-03-17 DIAGNOSIS — Z6829 Body mass index (BMI) 29.0-29.9, adult: Secondary | ICD-10-CM | POA: Diagnosis not present

## 2017-03-17 DIAGNOSIS — M5136 Other intervertebral disc degeneration, lumbar region: Secondary | ICD-10-CM | POA: Diagnosis not present

## 2017-03-20 ENCOUNTER — Encounter (INDEPENDENT_AMBULATORY_CARE_PROVIDER_SITE_OTHER): Payer: Medicare Other | Admitting: Ophthalmology

## 2017-03-20 DIAGNOSIS — D3132 Benign neoplasm of left choroid: Secondary | ICD-10-CM

## 2017-03-20 DIAGNOSIS — H353111 Nonexudative age-related macular degeneration, right eye, early dry stage: Secondary | ICD-10-CM | POA: Diagnosis not present

## 2017-03-20 DIAGNOSIS — H43813 Vitreous degeneration, bilateral: Secondary | ICD-10-CM

## 2017-03-20 DIAGNOSIS — H353221 Exudative age-related macular degeneration, left eye, with active choroidal neovascularization: Secondary | ICD-10-CM

## 2017-03-20 DIAGNOSIS — H35033 Hypertensive retinopathy, bilateral: Secondary | ICD-10-CM

## 2017-03-20 DIAGNOSIS — I10 Essential (primary) hypertension: Secondary | ICD-10-CM

## 2017-03-24 DIAGNOSIS — M659 Synovitis and tenosynovitis, unspecified: Secondary | ICD-10-CM | POA: Diagnosis not present

## 2017-03-24 DIAGNOSIS — G8918 Other acute postprocedural pain: Secondary | ICD-10-CM | POA: Diagnosis not present

## 2017-03-24 DIAGNOSIS — M67861 Other specified disorders of synovium, right knee: Secondary | ICD-10-CM | POA: Diagnosis not present

## 2017-03-25 ENCOUNTER — Encounter (INDEPENDENT_AMBULATORY_CARE_PROVIDER_SITE_OTHER): Payer: Medicare Other | Admitting: Ophthalmology

## 2017-03-31 DIAGNOSIS — M67861 Other specified disorders of synovium, right knee: Secondary | ICD-10-CM | POA: Diagnosis not present

## 2017-04-16 DIAGNOSIS — H52223 Regular astigmatism, bilateral: Secondary | ICD-10-CM | POA: Diagnosis not present

## 2017-04-16 DIAGNOSIS — H40011 Open angle with borderline findings, low risk, right eye: Secondary | ICD-10-CM | POA: Diagnosis not present

## 2017-04-16 DIAGNOSIS — H11423 Conjunctival edema, bilateral: Secondary | ICD-10-CM | POA: Diagnosis not present

## 2017-04-16 DIAGNOSIS — H18413 Arcus senilis, bilateral: Secondary | ICD-10-CM | POA: Diagnosis not present

## 2017-04-16 DIAGNOSIS — H2513 Age-related nuclear cataract, bilateral: Secondary | ICD-10-CM | POA: Diagnosis not present

## 2017-04-16 DIAGNOSIS — H02403 Unspecified ptosis of bilateral eyelids: Secondary | ICD-10-CM | POA: Diagnosis not present

## 2017-04-16 DIAGNOSIS — H353222 Exudative age-related macular degeneration, left eye, with inactive choroidal neovascularization: Secondary | ICD-10-CM | POA: Diagnosis not present

## 2017-04-16 DIAGNOSIS — H353111 Nonexudative age-related macular degeneration, right eye, early dry stage: Secondary | ICD-10-CM | POA: Diagnosis not present

## 2017-04-16 DIAGNOSIS — H5203 Hypermetropia, bilateral: Secondary | ICD-10-CM | POA: Diagnosis not present

## 2017-04-16 DIAGNOSIS — H04123 Dry eye syndrome of bilateral lacrimal glands: Secondary | ICD-10-CM | POA: Diagnosis not present

## 2017-04-16 DIAGNOSIS — H11153 Pinguecula, bilateral: Secondary | ICD-10-CM | POA: Diagnosis not present

## 2017-04-16 DIAGNOSIS — H524 Presbyopia: Secondary | ICD-10-CM | POA: Diagnosis not present

## 2017-04-22 DIAGNOSIS — I129 Hypertensive chronic kidney disease with stage 1 through stage 4 chronic kidney disease, or unspecified chronic kidney disease: Secondary | ICD-10-CM | POA: Diagnosis not present

## 2017-04-22 DIAGNOSIS — Z6831 Body mass index (BMI) 31.0-31.9, adult: Secondary | ICD-10-CM | POA: Diagnosis not present

## 2017-04-22 DIAGNOSIS — N183 Chronic kidney disease, stage 3 (moderate): Secondary | ICD-10-CM | POA: Diagnosis not present

## 2017-04-22 DIAGNOSIS — M109 Gout, unspecified: Secondary | ICD-10-CM | POA: Diagnosis not present

## 2017-04-24 DIAGNOSIS — N183 Chronic kidney disease, stage 3 (moderate): Secondary | ICD-10-CM | POA: Diagnosis not present

## 2017-04-24 DIAGNOSIS — M150 Primary generalized (osteo)arthritis: Secondary | ICD-10-CM | POA: Diagnosis not present

## 2017-04-24 DIAGNOSIS — M0579 Rheumatoid arthritis with rheumatoid factor of multiple sites without organ or systems involvement: Secondary | ICD-10-CM | POA: Diagnosis not present

## 2017-04-24 DIAGNOSIS — M1A09X1 Idiopathic chronic gout, multiple sites, with tophus (tophi): Secondary | ICD-10-CM | POA: Diagnosis not present

## 2017-04-24 DIAGNOSIS — M5136 Other intervertebral disc degeneration, lumbar region: Secondary | ICD-10-CM | POA: Diagnosis not present

## 2017-04-24 DIAGNOSIS — Z6830 Body mass index (BMI) 30.0-30.9, adult: Secondary | ICD-10-CM | POA: Diagnosis not present

## 2017-04-24 DIAGNOSIS — E669 Obesity, unspecified: Secondary | ICD-10-CM | POA: Diagnosis not present

## 2017-05-18 DIAGNOSIS — H02403 Unspecified ptosis of bilateral eyelids: Secondary | ICD-10-CM | POA: Diagnosis not present

## 2017-05-29 ENCOUNTER — Encounter (INDEPENDENT_AMBULATORY_CARE_PROVIDER_SITE_OTHER): Payer: Medicare Other | Admitting: Ophthalmology

## 2017-05-29 DIAGNOSIS — H35033 Hypertensive retinopathy, bilateral: Secondary | ICD-10-CM | POA: Diagnosis not present

## 2017-05-29 DIAGNOSIS — H353121 Nonexudative age-related macular degeneration, left eye, early dry stage: Secondary | ICD-10-CM | POA: Diagnosis not present

## 2017-05-29 DIAGNOSIS — H43813 Vitreous degeneration, bilateral: Secondary | ICD-10-CM | POA: Diagnosis not present

## 2017-05-29 DIAGNOSIS — D3132 Benign neoplasm of left choroid: Secondary | ICD-10-CM

## 2017-05-29 DIAGNOSIS — H2513 Age-related nuclear cataract, bilateral: Secondary | ICD-10-CM | POA: Diagnosis not present

## 2017-05-29 DIAGNOSIS — H353111 Nonexudative age-related macular degeneration, right eye, early dry stage: Secondary | ICD-10-CM | POA: Diagnosis not present

## 2017-05-29 DIAGNOSIS — I10 Essential (primary) hypertension: Secondary | ICD-10-CM | POA: Diagnosis not present

## 2017-06-04 DIAGNOSIS — I10 Essential (primary) hypertension: Secondary | ICD-10-CM | POA: Diagnosis not present

## 2017-06-04 DIAGNOSIS — J302 Other seasonal allergic rhinitis: Secondary | ICD-10-CM | POA: Diagnosis not present

## 2017-06-11 DIAGNOSIS — N183 Chronic kidney disease, stage 3 (moderate): Secondary | ICD-10-CM | POA: Diagnosis not present

## 2017-06-11 DIAGNOSIS — M0579 Rheumatoid arthritis with rheumatoid factor of multiple sites without organ or systems involvement: Secondary | ICD-10-CM | POA: Diagnosis not present

## 2017-06-11 DIAGNOSIS — M5136 Other intervertebral disc degeneration, lumbar region: Secondary | ICD-10-CM | POA: Diagnosis not present

## 2017-06-11 DIAGNOSIS — M150 Primary generalized (osteo)arthritis: Secondary | ICD-10-CM | POA: Diagnosis not present

## 2017-06-11 DIAGNOSIS — M1A09X1 Idiopathic chronic gout, multiple sites, with tophus (tophi): Secondary | ICD-10-CM | POA: Diagnosis not present

## 2017-06-11 DIAGNOSIS — Z6830 Body mass index (BMI) 30.0-30.9, adult: Secondary | ICD-10-CM | POA: Diagnosis not present

## 2017-06-16 DIAGNOSIS — J209 Acute bronchitis, unspecified: Secondary | ICD-10-CM | POA: Diagnosis not present

## 2017-06-16 DIAGNOSIS — J0100 Acute maxillary sinusitis, unspecified: Secondary | ICD-10-CM | POA: Diagnosis not present

## 2017-06-20 DIAGNOSIS — H1031 Unspecified acute conjunctivitis, right eye: Secondary | ICD-10-CM | POA: Diagnosis not present

## 2017-07-03 DIAGNOSIS — H53453 Other localized visual field defect, bilateral: Secondary | ICD-10-CM | POA: Diagnosis not present

## 2017-07-03 DIAGNOSIS — H02423 Myogenic ptosis of bilateral eyelids: Secondary | ICD-10-CM | POA: Diagnosis not present

## 2017-07-03 DIAGNOSIS — Z01818 Encounter for other preprocedural examination: Secondary | ICD-10-CM | POA: Diagnosis not present

## 2017-07-03 DIAGNOSIS — H02403 Unspecified ptosis of bilateral eyelids: Secondary | ICD-10-CM | POA: Diagnosis not present

## 2017-07-08 DIAGNOSIS — J45909 Unspecified asthma, uncomplicated: Secondary | ICD-10-CM | POA: Diagnosis not present

## 2017-07-08 DIAGNOSIS — B354 Tinea corporis: Secondary | ICD-10-CM | POA: Diagnosis not present

## 2017-07-08 DIAGNOSIS — R079 Chest pain, unspecified: Secondary | ICD-10-CM | POA: Diagnosis not present

## 2017-08-03 DIAGNOSIS — Z23 Encounter for immunization: Secondary | ICD-10-CM | POA: Diagnosis not present

## 2017-08-05 DIAGNOSIS — M150 Primary generalized (osteo)arthritis: Secondary | ICD-10-CM | POA: Diagnosis not present

## 2017-08-05 DIAGNOSIS — E669 Obesity, unspecified: Secondary | ICD-10-CM | POA: Diagnosis not present

## 2017-08-05 DIAGNOSIS — M1A09X1 Idiopathic chronic gout, multiple sites, with tophus (tophi): Secondary | ICD-10-CM | POA: Diagnosis not present

## 2017-08-05 DIAGNOSIS — M0579 Rheumatoid arthritis with rheumatoid factor of multiple sites without organ or systems involvement: Secondary | ICD-10-CM | POA: Diagnosis not present

## 2017-08-05 DIAGNOSIS — Z6830 Body mass index (BMI) 30.0-30.9, adult: Secondary | ICD-10-CM | POA: Diagnosis not present

## 2017-08-05 DIAGNOSIS — M5136 Other intervertebral disc degeneration, lumbar region: Secondary | ICD-10-CM | POA: Diagnosis not present

## 2017-08-05 DIAGNOSIS — N183 Chronic kidney disease, stage 3 (moderate): Secondary | ICD-10-CM | POA: Diagnosis not present

## 2017-08-07 ENCOUNTER — Telehealth (HOSPITAL_COMMUNITY): Payer: Self-pay | Admitting: Internal Medicine

## 2017-08-13 NOTE — Telephone Encounter (Signed)
User: Cherie Dark A Date/time: 08/13/17 11:36 AM  Comment: Called pt and lmsg for him to CB to get scheduled for lexiscan.   Context:  Outcome: Left Message  Phone number: [phone] Phone Type: Home Phone  Comm. type: Telephone Call type: Outgoing  Contact: Sidney, Irena Cords Relation to patient: Self    User: Cherie Dark A Date/time: 08/12/17 10:55 AM  Comment: Called pt and lmsg for him to CB to get scheduled for lexiscan.  Context:  Outcome: Left Message  Phone number: [phone] Phone Type: Home Phone  Comm. type: Telephone Call type: Outgoing  Contact: Iglehart, Irena Cords Relation to patient: Self    User: Cherie Dark A Date/time: 08/07/17 10:40 AM  Comment: Called pt and lmsg for him to CB to get schedule for stress test.   Context:  Outcome: Left Message  Phone number: [phone] Phone Type: Home Phone  Comm. type: Telephone Call type: Outgoing  Contact: Heavrin, Irena Cords Relation to patient: [facility] and spoke with Tye Maryland to inform her that a few calls had been made to schedule patient for a lexiscan and no response has been received at this time.

## 2017-08-18 ENCOUNTER — Telehealth (HOSPITAL_COMMUNITY): Payer: Self-pay | Admitting: Internal Medicine

## 2017-08-18 NOTE — Telephone Encounter (Signed)
Patient called me and he stated that his doctor gave him medicine that helped with his heart and he will not be needing the test at this time.

## 2017-08-19 DIAGNOSIS — Z23 Encounter for immunization: Secondary | ICD-10-CM | POA: Diagnosis not present

## 2017-08-19 DIAGNOSIS — R5383 Other fatigue: Secondary | ICD-10-CM | POA: Diagnosis not present

## 2017-08-21 ENCOUNTER — Encounter (INDEPENDENT_AMBULATORY_CARE_PROVIDER_SITE_OTHER): Payer: Medicare Other | Admitting: Ophthalmology

## 2017-08-21 DIAGNOSIS — H353221 Exudative age-related macular degeneration, left eye, with active choroidal neovascularization: Secondary | ICD-10-CM

## 2017-08-21 DIAGNOSIS — I10 Essential (primary) hypertension: Secondary | ICD-10-CM

## 2017-08-21 DIAGNOSIS — D3132 Benign neoplasm of left choroid: Secondary | ICD-10-CM | POA: Diagnosis not present

## 2017-08-21 DIAGNOSIS — H43813 Vitreous degeneration, bilateral: Secondary | ICD-10-CM

## 2017-08-21 DIAGNOSIS — H35033 Hypertensive retinopathy, bilateral: Secondary | ICD-10-CM

## 2017-08-21 DIAGNOSIS — H353111 Nonexudative age-related macular degeneration, right eye, early dry stage: Secondary | ICD-10-CM

## 2017-09-03 DIAGNOSIS — M0579 Rheumatoid arthritis with rheumatoid factor of multiple sites without organ or systems involvement: Secondary | ICD-10-CM | POA: Diagnosis not present

## 2017-09-08 DIAGNOSIS — K219 Gastro-esophageal reflux disease without esophagitis: Secondary | ICD-10-CM | POA: Diagnosis not present

## 2017-09-09 DIAGNOSIS — R1013 Epigastric pain: Secondary | ICD-10-CM | POA: Diagnosis not present

## 2017-09-09 DIAGNOSIS — K219 Gastro-esophageal reflux disease without esophagitis: Secondary | ICD-10-CM | POA: Diagnosis not present

## 2017-10-01 DIAGNOSIS — M0579 Rheumatoid arthritis with rheumatoid factor of multiple sites without organ or systems involvement: Secondary | ICD-10-CM | POA: Diagnosis not present

## 2017-10-15 ENCOUNTER — Encounter (INDEPENDENT_AMBULATORY_CARE_PROVIDER_SITE_OTHER): Payer: Medicare Other | Admitting: Ophthalmology

## 2017-10-15 DIAGNOSIS — H2513 Age-related nuclear cataract, bilateral: Secondary | ICD-10-CM | POA: Diagnosis not present

## 2017-10-15 DIAGNOSIS — H35033 Hypertensive retinopathy, bilateral: Secondary | ICD-10-CM | POA: Diagnosis not present

## 2017-10-15 DIAGNOSIS — D3132 Benign neoplasm of left choroid: Secondary | ICD-10-CM

## 2017-10-15 DIAGNOSIS — H353221 Exudative age-related macular degeneration, left eye, with active choroidal neovascularization: Secondary | ICD-10-CM | POA: Diagnosis not present

## 2017-10-15 DIAGNOSIS — H353112 Nonexudative age-related macular degeneration, right eye, intermediate dry stage: Secondary | ICD-10-CM | POA: Diagnosis not present

## 2017-10-15 DIAGNOSIS — I10 Essential (primary) hypertension: Secondary | ICD-10-CM

## 2017-10-15 DIAGNOSIS — H43813 Vitreous degeneration, bilateral: Secondary | ICD-10-CM | POA: Diagnosis not present

## 2017-10-21 DIAGNOSIS — K219 Gastro-esophageal reflux disease without esophagitis: Secondary | ICD-10-CM | POA: Diagnosis not present

## 2017-10-21 DIAGNOSIS — R1013 Epigastric pain: Secondary | ICD-10-CM | POA: Diagnosis not present

## 2017-10-23 DIAGNOSIS — G8929 Other chronic pain: Secondary | ICD-10-CM | POA: Insufficient documentation

## 2017-10-28 DIAGNOSIS — M0579 Rheumatoid arthritis with rheumatoid factor of multiple sites without organ or systems involvement: Secondary | ICD-10-CM | POA: Diagnosis not present

## 2017-10-28 DIAGNOSIS — I5032 Chronic diastolic (congestive) heart failure: Secondary | ICD-10-CM | POA: Diagnosis not present

## 2017-10-28 DIAGNOSIS — M109 Gout, unspecified: Secondary | ICD-10-CM | POA: Diagnosis not present

## 2017-10-28 DIAGNOSIS — E291 Testicular hypofunction: Secondary | ICD-10-CM | POA: Diagnosis not present

## 2017-10-28 DIAGNOSIS — E7800 Pure hypercholesterolemia, unspecified: Secondary | ICD-10-CM | POA: Diagnosis not present

## 2017-10-28 DIAGNOSIS — N183 Chronic kidney disease, stage 3 (moderate): Secondary | ICD-10-CM | POA: Diagnosis not present

## 2017-10-28 DIAGNOSIS — I10 Essential (primary) hypertension: Secondary | ICD-10-CM | POA: Diagnosis not present

## 2017-10-28 DIAGNOSIS — E559 Vitamin D deficiency, unspecified: Secondary | ICD-10-CM | POA: Diagnosis not present

## 2017-10-28 DIAGNOSIS — N400 Enlarged prostate without lower urinary tract symptoms: Secondary | ICD-10-CM | POA: Diagnosis not present

## 2017-10-28 DIAGNOSIS — Z86711 Personal history of pulmonary embolism: Secondary | ICD-10-CM | POA: Diagnosis not present

## 2017-10-28 DIAGNOSIS — Z1389 Encounter for screening for other disorder: Secondary | ICD-10-CM | POA: Diagnosis not present

## 2017-10-28 DIAGNOSIS — Z6829 Body mass index (BMI) 29.0-29.9, adult: Secondary | ICD-10-CM | POA: Diagnosis not present

## 2017-11-19 DIAGNOSIS — M5136 Other intervertebral disc degeneration, lumbar region: Secondary | ICD-10-CM | POA: Diagnosis not present

## 2017-11-26 DIAGNOSIS — M0579 Rheumatoid arthritis with rheumatoid factor of multiple sites without organ or systems involvement: Secondary | ICD-10-CM | POA: Diagnosis not present

## 2017-12-02 DIAGNOSIS — K222 Esophageal obstruction: Secondary | ICD-10-CM | POA: Diagnosis not present

## 2017-12-02 DIAGNOSIS — K219 Gastro-esophageal reflux disease without esophagitis: Secondary | ICD-10-CM | POA: Diagnosis not present

## 2017-12-02 DIAGNOSIS — K5900 Constipation, unspecified: Secondary | ICD-10-CM | POA: Diagnosis not present

## 2017-12-10 ENCOUNTER — Encounter (INDEPENDENT_AMBULATORY_CARE_PROVIDER_SITE_OTHER): Payer: Medicare Other | Admitting: Ophthalmology

## 2017-12-10 DIAGNOSIS — H353221 Exudative age-related macular degeneration, left eye, with active choroidal neovascularization: Secondary | ICD-10-CM | POA: Diagnosis not present

## 2017-12-10 DIAGNOSIS — H2513 Age-related nuclear cataract, bilateral: Secondary | ICD-10-CM

## 2017-12-10 DIAGNOSIS — D3132 Benign neoplasm of left choroid: Secondary | ICD-10-CM | POA: Diagnosis not present

## 2017-12-10 DIAGNOSIS — H353112 Nonexudative age-related macular degeneration, right eye, intermediate dry stage: Secondary | ICD-10-CM | POA: Diagnosis not present

## 2017-12-10 DIAGNOSIS — H35033 Hypertensive retinopathy, bilateral: Secondary | ICD-10-CM

## 2017-12-10 DIAGNOSIS — I10 Essential (primary) hypertension: Secondary | ICD-10-CM

## 2017-12-10 DIAGNOSIS — H43813 Vitreous degeneration, bilateral: Secondary | ICD-10-CM | POA: Diagnosis not present

## 2017-12-16 DIAGNOSIS — M5136 Other intervertebral disc degeneration, lumbar region: Secondary | ICD-10-CM | POA: Diagnosis not present

## 2017-12-16 DIAGNOSIS — N183 Chronic kidney disease, stage 3 (moderate): Secondary | ICD-10-CM | POA: Diagnosis not present

## 2017-12-16 DIAGNOSIS — M150 Primary generalized (osteo)arthritis: Secondary | ICD-10-CM | POA: Diagnosis not present

## 2017-12-16 DIAGNOSIS — Z6831 Body mass index (BMI) 31.0-31.9, adult: Secondary | ICD-10-CM | POA: Diagnosis not present

## 2017-12-16 DIAGNOSIS — M1A09X1 Idiopathic chronic gout, multiple sites, with tophus (tophi): Secondary | ICD-10-CM | POA: Diagnosis not present

## 2017-12-16 DIAGNOSIS — M0579 Rheumatoid arthritis with rheumatoid factor of multiple sites without organ or systems involvement: Secondary | ICD-10-CM | POA: Diagnosis not present

## 2017-12-16 DIAGNOSIS — E669 Obesity, unspecified: Secondary | ICD-10-CM | POA: Diagnosis not present

## 2017-12-17 DIAGNOSIS — M0579 Rheumatoid arthritis with rheumatoid factor of multiple sites without organ or systems involvement: Secondary | ICD-10-CM | POA: Diagnosis not present

## 2017-12-30 DIAGNOSIS — N183 Chronic kidney disease, stage 3 (moderate): Secondary | ICD-10-CM | POA: Diagnosis not present

## 2017-12-30 DIAGNOSIS — M109 Gout, unspecified: Secondary | ICD-10-CM | POA: Diagnosis not present

## 2017-12-30 DIAGNOSIS — Z6831 Body mass index (BMI) 31.0-31.9, adult: Secondary | ICD-10-CM | POA: Diagnosis not present

## 2017-12-30 DIAGNOSIS — I129 Hypertensive chronic kidney disease with stage 1 through stage 4 chronic kidney disease, or unspecified chronic kidney disease: Secondary | ICD-10-CM | POA: Diagnosis not present

## 2017-12-31 DIAGNOSIS — M0579 Rheumatoid arthritis with rheumatoid factor of multiple sites without organ or systems involvement: Secondary | ICD-10-CM | POA: Diagnosis not present

## 2018-01-05 DIAGNOSIS — L578 Other skin changes due to chronic exposure to nonionizing radiation: Secondary | ICD-10-CM | POA: Diagnosis not present

## 2018-01-05 DIAGNOSIS — L821 Other seborrheic keratosis: Secondary | ICD-10-CM | POA: Diagnosis not present

## 2018-01-05 DIAGNOSIS — L820 Inflamed seborrheic keratosis: Secondary | ICD-10-CM | POA: Diagnosis not present

## 2018-01-05 DIAGNOSIS — L720 Epidermal cyst: Secondary | ICD-10-CM | POA: Diagnosis not present

## 2018-01-05 DIAGNOSIS — L570 Actinic keratosis: Secondary | ICD-10-CM | POA: Diagnosis not present

## 2018-01-08 DIAGNOSIS — J45909 Unspecified asthma, uncomplicated: Secondary | ICD-10-CM | POA: Diagnosis not present

## 2018-01-08 DIAGNOSIS — J302 Other seasonal allergic rhinitis: Secondary | ICD-10-CM | POA: Diagnosis not present

## 2018-01-12 DIAGNOSIS — H25013 Cortical age-related cataract, bilateral: Secondary | ICD-10-CM | POA: Diagnosis not present

## 2018-01-12 DIAGNOSIS — H18413 Arcus senilis, bilateral: Secondary | ICD-10-CM | POA: Diagnosis not present

## 2018-01-12 DIAGNOSIS — H2513 Age-related nuclear cataract, bilateral: Secondary | ICD-10-CM | POA: Diagnosis not present

## 2018-01-12 DIAGNOSIS — H353221 Exudative age-related macular degeneration, left eye, with active choroidal neovascularization: Secondary | ICD-10-CM | POA: Diagnosis not present

## 2018-01-12 DIAGNOSIS — H11153 Pinguecula, bilateral: Secondary | ICD-10-CM | POA: Diagnosis not present

## 2018-01-12 DIAGNOSIS — H353111 Nonexudative age-related macular degeneration, right eye, early dry stage: Secondary | ICD-10-CM | POA: Diagnosis not present

## 2018-01-12 DIAGNOSIS — H524 Presbyopia: Secondary | ICD-10-CM | POA: Diagnosis not present

## 2018-01-12 DIAGNOSIS — H52223 Regular astigmatism, bilateral: Secondary | ICD-10-CM | POA: Diagnosis not present

## 2018-01-12 DIAGNOSIS — H5203 Hypermetropia, bilateral: Secondary | ICD-10-CM | POA: Diagnosis not present

## 2018-01-12 DIAGNOSIS — H04123 Dry eye syndrome of bilateral lacrimal glands: Secondary | ICD-10-CM | POA: Diagnosis not present

## 2018-01-12 DIAGNOSIS — H25043 Posterior subcapsular polar age-related cataract, bilateral: Secondary | ICD-10-CM | POA: Diagnosis not present

## 2018-01-13 DIAGNOSIS — K5900 Constipation, unspecified: Secondary | ICD-10-CM | POA: Diagnosis not present

## 2018-01-13 DIAGNOSIS — K222 Esophageal obstruction: Secondary | ICD-10-CM | POA: Diagnosis not present

## 2018-01-13 DIAGNOSIS — K219 Gastro-esophageal reflux disease without esophagitis: Secondary | ICD-10-CM | POA: Diagnosis not present

## 2018-01-15 DIAGNOSIS — R319 Hematuria, unspecified: Secondary | ICD-10-CM | POA: Diagnosis not present

## 2018-01-15 DIAGNOSIS — R300 Dysuria: Secondary | ICD-10-CM | POA: Diagnosis not present

## 2018-02-02 DIAGNOSIS — R350 Frequency of micturition: Secondary | ICD-10-CM | POA: Diagnosis not present

## 2018-02-02 DIAGNOSIS — R310 Gross hematuria: Secondary | ICD-10-CM | POA: Diagnosis not present

## 2018-02-02 DIAGNOSIS — N401 Enlarged prostate with lower urinary tract symptoms: Secondary | ICD-10-CM | POA: Diagnosis not present

## 2018-02-02 DIAGNOSIS — R948 Abnormal results of function studies of other organs and systems: Secondary | ICD-10-CM | POA: Diagnosis not present

## 2018-02-02 DIAGNOSIS — E291 Testicular hypofunction: Secondary | ICD-10-CM | POA: Diagnosis not present

## 2018-02-04 DIAGNOSIS — M5136 Other intervertebral disc degeneration, lumbar region: Secondary | ICD-10-CM | POA: Diagnosis not present

## 2018-02-11 ENCOUNTER — Encounter (INDEPENDENT_AMBULATORY_CARE_PROVIDER_SITE_OTHER): Payer: Medicare Other | Admitting: Ophthalmology

## 2018-02-11 DIAGNOSIS — H353111 Nonexudative age-related macular degeneration, right eye, early dry stage: Secondary | ICD-10-CM

## 2018-02-11 DIAGNOSIS — H353221 Exudative age-related macular degeneration, left eye, with active choroidal neovascularization: Secondary | ICD-10-CM

## 2018-02-11 DIAGNOSIS — H35033 Hypertensive retinopathy, bilateral: Secondary | ICD-10-CM

## 2018-02-11 DIAGNOSIS — I10 Essential (primary) hypertension: Secondary | ICD-10-CM | POA: Diagnosis not present

## 2018-02-11 DIAGNOSIS — H43813 Vitreous degeneration, bilateral: Secondary | ICD-10-CM | POA: Diagnosis not present

## 2018-02-11 DIAGNOSIS — D3132 Benign neoplasm of left choroid: Secondary | ICD-10-CM

## 2018-02-15 DIAGNOSIS — N401 Enlarged prostate with lower urinary tract symptoms: Secondary | ICD-10-CM | POA: Diagnosis not present

## 2018-02-15 DIAGNOSIS — R350 Frequency of micturition: Secondary | ICD-10-CM | POA: Diagnosis not present

## 2018-02-15 DIAGNOSIS — E291 Testicular hypofunction: Secondary | ICD-10-CM | POA: Diagnosis not present

## 2018-02-15 DIAGNOSIS — N5201 Erectile dysfunction due to arterial insufficiency: Secondary | ICD-10-CM | POA: Diagnosis not present

## 2018-02-16 DIAGNOSIS — M5136 Other intervertebral disc degeneration, lumbar region: Secondary | ICD-10-CM | POA: Diagnosis not present

## 2018-02-16 DIAGNOSIS — N183 Chronic kidney disease, stage 3 (moderate): Secondary | ICD-10-CM | POA: Diagnosis not present

## 2018-02-16 DIAGNOSIS — E669 Obesity, unspecified: Secondary | ICD-10-CM | POA: Diagnosis not present

## 2018-02-16 DIAGNOSIS — Z6830 Body mass index (BMI) 30.0-30.9, adult: Secondary | ICD-10-CM | POA: Diagnosis not present

## 2018-02-16 DIAGNOSIS — M0579 Rheumatoid arthritis with rheumatoid factor of multiple sites without organ or systems involvement: Secondary | ICD-10-CM | POA: Diagnosis not present

## 2018-02-16 DIAGNOSIS — M1A09X1 Idiopathic chronic gout, multiple sites, with tophus (tophi): Secondary | ICD-10-CM | POA: Diagnosis not present

## 2018-02-16 DIAGNOSIS — M150 Primary generalized (osteo)arthritis: Secondary | ICD-10-CM | POA: Diagnosis not present

## 2018-02-19 DIAGNOSIS — M0579 Rheumatoid arthritis with rheumatoid factor of multiple sites without organ or systems involvement: Secondary | ICD-10-CM | POA: Diagnosis not present

## 2018-03-17 DIAGNOSIS — J4540 Moderate persistent asthma, uncomplicated: Secondary | ICD-10-CM | POA: Diagnosis not present

## 2018-03-19 DIAGNOSIS — M0579 Rheumatoid arthritis with rheumatoid factor of multiple sites without organ or systems involvement: Secondary | ICD-10-CM | POA: Diagnosis not present

## 2018-04-05 ENCOUNTER — Encounter (INDEPENDENT_AMBULATORY_CARE_PROVIDER_SITE_OTHER): Payer: Medicare Other | Admitting: Ophthalmology

## 2018-04-05 DIAGNOSIS — H353112 Nonexudative age-related macular degeneration, right eye, intermediate dry stage: Secondary | ICD-10-CM

## 2018-04-05 DIAGNOSIS — D3132 Benign neoplasm of left choroid: Secondary | ICD-10-CM | POA: Diagnosis not present

## 2018-04-05 DIAGNOSIS — H353221 Exudative age-related macular degeneration, left eye, with active choroidal neovascularization: Secondary | ICD-10-CM | POA: Diagnosis not present

## 2018-04-05 DIAGNOSIS — I10 Essential (primary) hypertension: Secondary | ICD-10-CM | POA: Diagnosis not present

## 2018-04-05 DIAGNOSIS — H2513 Age-related nuclear cataract, bilateral: Secondary | ICD-10-CM

## 2018-04-05 DIAGNOSIS — H35033 Hypertensive retinopathy, bilateral: Secondary | ICD-10-CM

## 2018-04-05 DIAGNOSIS — H43813 Vitreous degeneration, bilateral: Secondary | ICD-10-CM

## 2018-04-06 ENCOUNTER — Ambulatory Visit (INDEPENDENT_AMBULATORY_CARE_PROVIDER_SITE_OTHER): Payer: Medicare Other | Admitting: Allergy

## 2018-04-06 ENCOUNTER — Encounter: Payer: Self-pay | Admitting: Allergy

## 2018-04-06 VITALS — BP 138/70 | HR 100 | Temp 98.4°F | Resp 20 | Ht 69.8 in | Wt 216.0 lb

## 2018-04-06 DIAGNOSIS — R062 Wheezing: Secondary | ICD-10-CM | POA: Diagnosis not present

## 2018-04-06 DIAGNOSIS — J310 Chronic rhinitis: Secondary | ICD-10-CM | POA: Diagnosis not present

## 2018-04-06 MED ORDER — AZELASTINE HCL 0.1 % NA SOLN: NASAL | 5 refills | Status: DC

## 2018-04-06 NOTE — Patient Instructions (Addendum)
Allergies    - will obtain environmental allergen panel to determine if your nasal, ear and eye symptoms are being triggered by allergens.       - it does seem that your current infusion for arthritis has triggered these current allergy symptoms.  You can discuss this with your rheumatologist    - you have had some improvement with use of xyzal, montelukast and flonase    - would continue xyzal 5mg  and singulair 10mg  daily at this time    - use flonase if you are having nasal congestion.  Use 2 sprays each nostril daily for 1-2 weeks at a time before stopping once improved.  If you are not having any congestion then hold flonase    - for nasal drainage/runny nose recommend use of nasal antihistamine, Astelin or Azelastin, 2 sprays each nostril twice a day   Wheezing   - have access to albuterol inhaler 2 puffs every 4-6 hours as needed for cough/wheeze/shortness of breath/chest tightness.  May use 15-20 minutes prior to activity.   Monitor frequency of use.     - continue montelukast daily as this is also a medication that helps to manage asthma  Asthma control goals:   Full participation in all desired activities (may need albuterol before activity)  Albuterol use two time or less a week on average (not counting use with activity)  Cough interfering with sleep two time or less a month  Oral steroids no more than once a year  No hospitalizations  Follow-up 3-4 months.  We will all you with the results and will request your other labwork from your PCP and/or rheumatologist

## 2018-04-06 NOTE — Progress Notes (Signed)
New Patient Note  RE: [name] MRN: [id number] DOB: 1946/05/05 Date of Office Visit: 04/06/2018  Referring provider: [name], MD Primary care provider: Nona Dell, Corene Cornea, MD  Chief Complaint: sinus issues  History of present illness: [name] is a 72 y.o. male presenting today for consultation for allergies and wheezing.   He has arthritis and follows with rheumatologist and states he was recently started on a new arthritis infusion ( states he was previously he on remicade but that was stopped as he was having a lot of infections on Remicade).  He does not know the name of this new infusion.  He states he has now gotten to infusions that are every 8 weeks.  His last infusion was about a month ago.  He states he did well after the first infusion but after the second infusion he started to develop allergy symptoms.  He states his nose burns, feels nasal pressure and a lot of nasal drainage, crusting of eyes in the morning.  He is convinced that his allergy symptoms are related to this infusion.  He states he is planning to talk with his rheumatologist about the infusions and potentially changing to another agent.  However he states that his arthritis pain is rather severe and states he absolutely needs this to be managed.   He is currently taking xyzal and montelukast, flonase which he states that this regimen "slows" down his allergy symptoms.    He states he was having some wheezing before he started on the infusion above.  He states he does seem to wheeze during warmer months.  He has an albuterol inhaler and states he takes it daily when he gets sick with respiratory illnesses and during warmer months will use occasionally as needed.  He denies ever needing an ICS or combination inhaler medication.  He states he has never been on montelukast before he was recently started on this by his PCP.  He denies getting any steroid medication for breathing letter problems however he  did receive "steroid allergy shot" by his PCP about 2 to 3 months ago.  He denies any history of eczema or food allergy.  Review of systems: Review of Systems  Constitutional: Negative for chills, fever and malaise/fatigue.  HENT: Positive for congestion and sinus pain. Negative for ear discharge, ear pain, nosebleeds and sore throat.   Eyes: Positive for discharge. Negative for pain and redness.  Respiratory: Positive for wheezing. Negative for cough, sputum production and shortness of breath.   Cardiovascular: Negative for chest pain.  Gastrointestinal: Negative for abdominal pain, constipation, diarrhea, heartburn, nausea and vomiting.  Musculoskeletal: Positive for joint pain.  Skin: Negative for itching and rash.  Neurological: Negative for headaches.    All other systems negative unless noted above in HPI  Past medical history: Past Medical History:  Diagnosis Date  . Arthritis   . Asthma   . BPH (benign prostatic hyperplasia)   . Complication of anesthesia    PROBLEMS VOIDING AFTER HERNIA REPAIR  . GERD (gastroesophageal reflux disease)   . Gout   . Hyperlipidemia   . Hypertension   . IBS (irritable bowel syndrome)   . Macular degeneration   . Pulmonary embolus ([place]) 09/06/12   HOSP AT [facility] AND PLACED ON COUMADIN  . Rheumatoid arthritis ([place])   . Seasonal allergies   . Urinary retention    HX OF KIDNEY STONES-BILATERAL, HX BPH  -- PT TOLD HIS KIDNEY FUNCTIONS  ELVATED  WHILE HOSP DEC 2013 FOR PULMONARY EMBOLUS    Past surgical history: Past Surgical History:  Procedure Laterality Date  . ADENOIDECTOMY    . APPENDECTOMY    . CHOLECYSTECTOMY    . HERNIA REPAIR  2011   umb  . LUMBAR LAMINECTOMY    . RIGHT HAND SURGERY 06/30/12    . TONSILLECTOMY    . TOTAL KNEE ARTHROPLASTY Right 06/17/2013   Procedure: RIGHT TOTAL KNEE ARTHROPLASTY;  Surgeon: Gearlean Alf, MD;  Location: [facility];  Service: Orthopedics;  Laterality: Right;    Family history:    Family History  Problem Relation Age of Onset  . Stroke Mother   . Heart attack Mother   . High blood pressure Mother   . Prostate cancer Father     Social history: Widowed.  Lives in a home without carpeting with gas and electric heating and central cooling.  There are dogs in the home.  No concern for water damage, mildew or roaches in the home.  He denies a smoking history.  He is retired.  Medication List: Allergies as of 04/06/2018      Reactions   Caffeine    Urinary retention   Codeine    "feel jittery"   Penicillins Itching   Pseudoephedrine    Urinary retention   Simvastatin Other (See Comments)   Hurt my bones      Medication List        Accurate as of 04/06/18  3:41 PM. Always use your most recent med list.          allopurinol 100 MG tablet Commonly known as:  ZYLOPRIM Take 100 mg by mouth 2 (two) times daily.   doxazosin 2 MG tablet Commonly known as:  CARDURA Take 2 mg by mouth every morning.   fluticasone 50 MCG/ACT nasal spray Commonly known as:  FLONASE   furosemide 40 MG tablet Commonly known as:  LASIX Take 40 mg by mouth daily.   HYDROcodone-acetaminophen 10-325 MG tablet Commonly known as:  NORCO Take 1 tablet by mouth 3 (three) times daily as needed.   levocetirizine 5 MG tablet Commonly known as:  XYZAL Take 5 mg by mouth daily as needed. For allergies   montelukast 10 MG tablet Commonly known as:  SINGULAIR   omeprazole 20 MG capsule Commonly known as:  PRILOSEC Take 20 mg by mouth 2 (two) times daily.   potassium citrate 10 MEQ (1080 MG) SR tablet Commonly known as:  UROCIT-K Take 10 mEq by mouth 2 (two) times daily.   PRESCRIPTION MEDICATION Infusion for rheumatoid arthritis- unsure of name of medication   Testosterone Cypionate 200 MG/ML Soln testosterone cypionate 200 mg/mL intramuscular oil  inject 0.5 milliliter intramuscularly every week       Known medication allergies: Allergies  Allergen Reactions  .  Caffeine     Urinary retention  . Codeine     "feel jittery"  . Penicillins Itching  . Pseudoephedrine     Urinary retention  . Simvastatin Other (See Comments)    Hurt my bones     Physical examination: Blood pressure 138/70, pulse 100, temperature 98.4 F (36.9 C), temperature source Oral, resp. rate 20, height 5' 9.8" (1.773 m), weight 216 lb (98 kg).  General: Alert, interactive, in no acute distress. HEENT: PERRLA, TMs pearly gray, turbinates mildly edematous with clear discharge, post-pharynx non erythematous. Neck: Supple without lymphadenopathy. Lungs: Clear to auscultation without wheezing, rhonchi or rales. {no increased work of breathing. CV: Normal S1,  S2 without murmurs. Abdomen: Nondistended, nontender. Skin: Warm and dry, without lesions or rashes. Extremities:  No clubbing, cyanosis or edema. Neuro:   Grossly intact.  Diagnositics/Labs:  Spirometry: FEV1: 1.77L 57%, FVC: 3.27L 76%, ratio consistent with Obstructive pattern with restrictive pattern  Assessment and plan:   Rhinitis with conjunctivitis    -Likely allergic in nature    - will obtain environmental allergen panel to determine if your nasal, ear and eye symptoms are being triggered by allergens.       - it does seem that your current infusion for arthritis has triggered these current allergy symptoms as the timing is consistent with your second infusion which would indicate sensitization.      - He has had improvement with use of xyzal, montelukast and flonase.  Thus it is likely that we can find a allergy regimen that will be effective enough to allow him to continue on his infusions.    - would continue xyzal 5mg  and singulair 10mg  daily at this time    - use flonase if you are having nasal congestion.  Use 2 sprays each nostril daily for 1-2 weeks at a time before stopping once improved.  If you are not having any congestion then hold flonase    - for nasal drainage/runny nose recommend use of nasal  antihistamine, Astelin or Azelastin, 2 sprays each nostril twice a day   Wheezing   - He does have a history of asthma.  Wheezing seems to be induced by heat.    -   - Lung function is reduced   - have access to albuterol inhaler 2 puffs every 4-6 hours as needed for cough/wheeze/shortness of breath/chest tightness.  May use 15-20 minutes prior to activity.   Monitor frequency of use.     - continue montelukast daily   Asthma control goals:   Full participation in all desired activities (may need albuterol before activity)  Albuterol use two time or less a week on average (not counting use with activity)  Cough interfering with sleep two time or less a month  Oral steroids no more than once a year  No hospitalizations  Follow-up 3-4 months.  We will all you with the results and will request your other labwork from your PCP and/or rheumatologist   I appreciate the opportunity to take part in Rodgers's care. Please do not hesitate to contact me with questions.  Sincerely,   Prudy Feeler, MD Allergy/Immunology [facility]

## 2018-04-10 LAB — ALLERGENS W/TOTAL IGE AREA 2
Alternaria Alternata IgE: <0.1 kU/L
Cat Dander IgE: <0.1 kU/L
Cedar, Mountain IgE: <0.1 kU/L
D Farinae IgE: <0.1 kU/L
D Pteronyssinus IgE: <0.1 kU/L
IGE (IMMUNOGLOBULIN E), SERUM: 5 [IU]/mL — AB (ref 6–495)
Maple/Box Elder IgE: <0.1 kU/L
Oak, White IgE: <0.1 kU/L
Pecan, Hickory IgE: <0.1 kU/L
Pigweed, Rough IgE: <0.1 kU/L
Ragweed, Short IgE: <0.1 kU/L
Sheep Sorrel IgE Qn: <0.1 kU/L
Timothy Grass IgE: <0.1 kU/L

## 2018-04-13 ENCOUNTER — Other Ambulatory Visit: Payer: Self-pay | Admitting: *Deleted

## 2018-04-13 ENCOUNTER — Telehealth: Payer: Self-pay | Admitting: *Deleted

## 2018-04-13 MED ORDER — ALBUTEROL SULFATE HFA 108 (90 BASE) MCG/ACT IN AERS: INHALATION_SPRAY | RESPIRATORY_TRACT | 1 refills | Status: DC

## 2018-04-13 NOTE — Telephone Encounter (Signed)
[name] requested that we send a prescription to [place] for his albuterol inhaler.  Per Dr. Nelva Bush, we cannot send an RX to another country. A printed prescription for albuterol was provided to [facility] and we suggested that he use a local pharmacy to fill.

## 2018-04-14 ENCOUNTER — Other Ambulatory Visit: Payer: Self-pay | Admitting: *Deleted

## 2018-04-14 DIAGNOSIS — K222 Esophageal obstruction: Secondary | ICD-10-CM | POA: Diagnosis not present

## 2018-04-14 DIAGNOSIS — K5900 Constipation, unspecified: Secondary | ICD-10-CM | POA: Diagnosis not present

## 2018-04-14 DIAGNOSIS — K219 Gastro-esophageal reflux disease without esophagitis: Secondary | ICD-10-CM | POA: Diagnosis not present

## 2018-04-14 MED ORDER — ALBUTEROL SULFATE HFA 108 (90 BASE) MCG/ACT IN AERS: INHALATION_SPRAY | RESPIRATORY_TRACT | 0 refills | Status: DC

## 2018-04-15 ENCOUNTER — Encounter: Payer: Self-pay | Admitting: *Deleted

## 2018-04-23 ENCOUNTER — Encounter: Payer: Self-pay | Admitting: *Deleted

## 2018-04-26 DIAGNOSIS — M961 Postlaminectomy syndrome, not elsewhere classified: Secondary | ICD-10-CM | POA: Insufficient documentation

## 2018-05-14 DIAGNOSIS — M0579 Rheumatoid arthritis with rheumatoid factor of multiple sites without organ or systems involvement: Secondary | ICD-10-CM | POA: Diagnosis not present

## 2018-05-16 DIAGNOSIS — J0100 Acute maxillary sinusitis, unspecified: Secondary | ICD-10-CM | POA: Diagnosis not present

## 2018-05-26 DIAGNOSIS — M5136 Other intervertebral disc degeneration, lumbar region: Secondary | ICD-10-CM | POA: Diagnosis not present

## 2018-05-26 DIAGNOSIS — E663 Overweight: Secondary | ICD-10-CM | POA: Diagnosis not present

## 2018-05-26 DIAGNOSIS — N183 Chronic kidney disease, stage 3 (moderate): Secondary | ICD-10-CM | POA: Diagnosis not present

## 2018-05-26 DIAGNOSIS — M150 Primary generalized (osteo)arthritis: Secondary | ICD-10-CM | POA: Diagnosis not present

## 2018-05-26 DIAGNOSIS — Z6829 Body mass index (BMI) 29.0-29.9, adult: Secondary | ICD-10-CM | POA: Diagnosis not present

## 2018-05-26 DIAGNOSIS — M1A09X1 Idiopathic chronic gout, multiple sites, with tophus (tophi): Secondary | ICD-10-CM | POA: Diagnosis not present

## 2018-05-26 DIAGNOSIS — M0579 Rheumatoid arthritis with rheumatoid factor of multiple sites without organ or systems involvement: Secondary | ICD-10-CM | POA: Diagnosis not present

## 2018-05-27 DIAGNOSIS — M5136 Other intervertebral disc degeneration, lumbar region: Secondary | ICD-10-CM | POA: Diagnosis not present

## 2018-05-27 DIAGNOSIS — Z79891 Long term (current) use of opiate analgesic: Secondary | ICD-10-CM | POA: Diagnosis not present

## 2018-05-27 DIAGNOSIS — M961 Postlaminectomy syndrome, not elsewhere classified: Secondary | ICD-10-CM | POA: Diagnosis not present

## 2018-05-27 DIAGNOSIS — J019 Acute sinusitis, unspecified: Secondary | ICD-10-CM | POA: Diagnosis not present

## 2018-05-27 DIAGNOSIS — J209 Acute bronchitis, unspecified: Secondary | ICD-10-CM | POA: Diagnosis not present

## 2018-05-31 ENCOUNTER — Encounter (INDEPENDENT_AMBULATORY_CARE_PROVIDER_SITE_OTHER): Payer: Medicare Other | Admitting: Ophthalmology

## 2018-06-03 ENCOUNTER — Encounter (INDEPENDENT_AMBULATORY_CARE_PROVIDER_SITE_OTHER): Payer: Medicare Other | Admitting: Ophthalmology

## 2018-06-03 DIAGNOSIS — D3132 Benign neoplasm of left choroid: Secondary | ICD-10-CM

## 2018-06-03 DIAGNOSIS — H35033 Hypertensive retinopathy, bilateral: Secondary | ICD-10-CM | POA: Diagnosis not present

## 2018-06-03 DIAGNOSIS — H353211 Exudative age-related macular degeneration, right eye, with active choroidal neovascularization: Secondary | ICD-10-CM | POA: Diagnosis not present

## 2018-06-03 DIAGNOSIS — H43813 Vitreous degeneration, bilateral: Secondary | ICD-10-CM | POA: Diagnosis not present

## 2018-06-03 DIAGNOSIS — H353122 Nonexudative age-related macular degeneration, left eye, intermediate dry stage: Secondary | ICD-10-CM

## 2018-06-03 DIAGNOSIS — I10 Essential (primary) hypertension: Secondary | ICD-10-CM

## 2018-06-03 DIAGNOSIS — H2513 Age-related nuclear cataract, bilateral: Secondary | ICD-10-CM | POA: Diagnosis not present

## 2018-06-11 DIAGNOSIS — Z79899 Other long term (current) drug therapy: Secondary | ICD-10-CM | POA: Diagnosis not present

## 2018-06-11 DIAGNOSIS — I10 Essential (primary) hypertension: Secondary | ICD-10-CM

## 2018-06-11 DIAGNOSIS — N189 Chronic kidney disease, unspecified: Secondary | ICD-10-CM | POA: Diagnosis not present

## 2018-06-11 DIAGNOSIS — J45909 Unspecified asthma, uncomplicated: Secondary | ICD-10-CM | POA: Diagnosis not present

## 2018-06-11 DIAGNOSIS — Z86711 Personal history of pulmonary embolism: Secondary | ICD-10-CM | POA: Diagnosis not present

## 2018-06-11 DIAGNOSIS — R0602 Shortness of breath: Secondary | ICD-10-CM | POA: Diagnosis not present

## 2018-06-11 DIAGNOSIS — K219 Gastro-esophageal reflux disease without esophagitis: Secondary | ICD-10-CM | POA: Diagnosis not present

## 2018-06-11 DIAGNOSIS — N183 Chronic kidney disease, stage 3 (moderate): Secondary | ICD-10-CM | POA: Diagnosis not present

## 2018-06-11 DIAGNOSIS — R079 Chest pain, unspecified: Secondary | ICD-10-CM | POA: Diagnosis not present

## 2018-06-11 DIAGNOSIS — R0789 Other chest pain: Secondary | ICD-10-CM | POA: Diagnosis not present

## 2018-06-11 DIAGNOSIS — M069 Rheumatoid arthritis, unspecified: Secondary | ICD-10-CM | POA: Diagnosis not present

## 2018-06-11 DIAGNOSIS — I129 Hypertensive chronic kidney disease with stage 1 through stage 4 chronic kidney disease, or unspecified chronic kidney disease: Secondary | ICD-10-CM | POA: Diagnosis not present

## 2018-06-11 DIAGNOSIS — Z86718 Personal history of other venous thrombosis and embolism: Secondary | ICD-10-CM | POA: Diagnosis not present

## 2018-06-11 DIAGNOSIS — R7989 Other specified abnormal findings of blood chemistry: Secondary | ICD-10-CM | POA: Diagnosis not present

## 2018-06-16 DIAGNOSIS — Z23 Encounter for immunization: Secondary | ICD-10-CM | POA: Diagnosis not present

## 2018-06-16 DIAGNOSIS — J45909 Unspecified asthma, uncomplicated: Secondary | ICD-10-CM | POA: Diagnosis not present

## 2018-06-16 DIAGNOSIS — R0789 Other chest pain: Secondary | ICD-10-CM | POA: Diagnosis not present

## 2018-06-23 ENCOUNTER — Telehealth: Payer: Self-pay

## 2018-06-23 NOTE — Telephone Encounter (Signed)
Referral sent to scheduling and notes filed

## 2018-07-02 ENCOUNTER — Encounter (INDEPENDENT_AMBULATORY_CARE_PROVIDER_SITE_OTHER): Payer: Medicare Other | Admitting: Ophthalmology

## 2018-07-02 DIAGNOSIS — H43813 Vitreous degeneration, bilateral: Secondary | ICD-10-CM

## 2018-07-02 DIAGNOSIS — H35033 Hypertensive retinopathy, bilateral: Secondary | ICD-10-CM

## 2018-07-02 DIAGNOSIS — H353221 Exudative age-related macular degeneration, left eye, with active choroidal neovascularization: Secondary | ICD-10-CM | POA: Diagnosis not present

## 2018-07-02 DIAGNOSIS — D3132 Benign neoplasm of left choroid: Secondary | ICD-10-CM

## 2018-07-02 DIAGNOSIS — H353111 Nonexudative age-related macular degeneration, right eye, early dry stage: Secondary | ICD-10-CM

## 2018-07-02 DIAGNOSIS — I10 Essential (primary) hypertension: Secondary | ICD-10-CM | POA: Diagnosis not present

## 2018-07-13 DIAGNOSIS — D631 Anemia in chronic kidney disease: Secondary | ICD-10-CM | POA: Diagnosis not present

## 2018-07-13 DIAGNOSIS — Z9889 Other specified postprocedural states: Secondary | ICD-10-CM | POA: Diagnosis not present

## 2018-07-13 DIAGNOSIS — N2581 Secondary hyperparathyroidism of renal origin: Secondary | ICD-10-CM | POA: Diagnosis not present

## 2018-07-13 DIAGNOSIS — I129 Hypertensive chronic kidney disease with stage 1 through stage 4 chronic kidney disease, or unspecified chronic kidney disease: Secondary | ICD-10-CM | POA: Diagnosis not present

## 2018-07-13 DIAGNOSIS — M069 Rheumatoid arthritis, unspecified: Secondary | ICD-10-CM | POA: Diagnosis not present

## 2018-07-13 DIAGNOSIS — M519 Unspecified thoracic, thoracolumbar and lumbosacral intervertebral disc disorder: Secondary | ICD-10-CM | POA: Diagnosis not present

## 2018-07-13 DIAGNOSIS — Z8719 Personal history of other diseases of the digestive system: Secondary | ICD-10-CM | POA: Diagnosis not present

## 2018-07-13 DIAGNOSIS — I82402 Acute embolism and thrombosis of unspecified deep veins of left lower extremity: Secondary | ICD-10-CM | POA: Diagnosis not present

## 2018-07-13 DIAGNOSIS — N183 Chronic kidney disease, stage 3 (moderate): Secondary | ICD-10-CM | POA: Diagnosis not present

## 2018-07-13 DIAGNOSIS — Z9049 Acquired absence of other specified parts of digestive tract: Secondary | ICD-10-CM | POA: Diagnosis not present

## 2018-07-20 DIAGNOSIS — M5136 Other intervertebral disc degeneration, lumbar region: Secondary | ICD-10-CM | POA: Diagnosis not present

## 2018-07-20 DIAGNOSIS — M545 Low back pain: Secondary | ICD-10-CM | POA: Diagnosis not present

## 2018-07-20 DIAGNOSIS — M961 Postlaminectomy syndrome, not elsewhere classified: Secondary | ICD-10-CM | POA: Diagnosis not present

## 2018-07-30 ENCOUNTER — Encounter (INDEPENDENT_AMBULATORY_CARE_PROVIDER_SITE_OTHER): Payer: Medicare Other | Admitting: Ophthalmology

## 2018-07-30 DIAGNOSIS — H353112 Nonexudative age-related macular degeneration, right eye, intermediate dry stage: Secondary | ICD-10-CM

## 2018-07-30 DIAGNOSIS — H35033 Hypertensive retinopathy, bilateral: Secondary | ICD-10-CM | POA: Diagnosis not present

## 2018-07-30 DIAGNOSIS — H43813 Vitreous degeneration, bilateral: Secondary | ICD-10-CM | POA: Diagnosis not present

## 2018-07-30 DIAGNOSIS — D3132 Benign neoplasm of left choroid: Secondary | ICD-10-CM

## 2018-07-30 DIAGNOSIS — I10 Essential (primary) hypertension: Secondary | ICD-10-CM

## 2018-08-11 NOTE — Progress Notes (Signed)
Cardiology Office Note   Date:  08/16/2018   ID:  [name], DOB 17-Oct-1945, MRN [id number]  PCP:  [name], MD  Cardiologist:   Jenkins Rouge, MD   No chief complaint on file.     History of Present Illness: [name] is a 72 y.o. male who presents for consultation regarding chest tightness Referred by DR [facility] Reviewed his office note from 06/16/18.  Patient admitted to [facility] with dyspnea and chest pain. History of PE. History of CKD  V/Q scan negative for PE ECG was non ischemic ? Infiltrative process with low voltage , poor R wave progression and long PR TTE with EF 55-60% borderline LVH.  On f/u just had cough no pain CRF;s HTN, HLD Rx with medrol dose pack for asthma and doxycycline   Also has arthritis previously Rx with Remicade but had too many infections ? New med started Seems that allergies and wheezing worse after 2nd new infusion. 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     Past Medical History:  Diagnosis Date  . Arthritis   . Asthma   . BPH (benign prostatic hyperplasia)   . Complication of anesthesia    PROBLEMS VOIDING AFTER HERNIA REPAIR  . GERD (gastroesophageal reflux disease)   . Gout   . Hyperlipidemia   . Hypertension   . IBS (irritable bowel syndrome)   . Macular degeneration   . Pulmonary embolus ([place]) 09/06/12   HOSP AT [facility] AND PLACED ON COUMADIN  . Rheumatoid arthritis ([place])   . Seasonal allergies   . Urinary retention    HX OF KIDNEY STONES-BILATERAL, HX BPH  -- PT TOLD HIS KIDNEY FUNCTIONS  ELVATED WHILE HOSP DEC 2013 FOR PULMONARY EMBOLUS    Past Surgical History:    Procedure Laterality Date  . ADENOIDECTOMY    . APPENDECTOMY    . CHOLECYSTECTOMY    . HERNIA REPAIR  2011   umb  . LUMBAR LAMINECTOMY    . RIGHT HAND SURGERY 06/30/12    . TONSILLECTOMY    . TOTAL KNEE ARTHROPLASTY Right 06/17/2013   Procedure: RIGHT TOTAL KNEE ARTHROPLASTY;  Surgeon: Gearlean Alf, MD;  Location: [facility];  Service: Orthopedics;  Laterality: Right;     Current Outpatient Medications  Medication Sig Dispense Refill  . albuterol (PROAIR HFA) 108 (90 Base) MCG/ACT inhaler Inhale two puffs every 4-6 hours if needed for cough or wheeze. 3 Inhaler 0  . allopurinol (ZYLOPRIM) 100 MG tablet Take 100 mg by mouth 2 (two) times daily.     Marland Kitchen azelastine (ASTELIN) 0.1 % nasal spray Use two sprays in each nostril twice daily as directed for nasal drainage. 30 mL 5  . doxazosin (CARDURA) 2 MG tablet Take 2 mg by mouth every morning.    . fluticasone (FLONASE) 50 MCG/ACT nasal spray     . furosemide (LASIX) 40 MG tablet Take 40 mg by mouth daily.  3  . HYDROcodone-acetaminophen (NORCO) 10-325 MG tablet Take 1 tablet by mouth 3 (three) times daily as needed.  0  . levocetirizine (XYZAL) 5 MG tablet Take 5 mg by mouth  daily as needed. For allergies    . montelukast (SINGULAIR) 10 MG tablet   5  . omeprazole (PRILOSEC) 20 MG capsule Take 20 mg by mouth 2 (two) times daily.    . potassium citrate (UROCIT-K) 10 MEQ (1080 MG) SR tablet Take 10 mEq by mouth 2 (two) times daily.    Marland Kitchen PRESCRIPTION MEDICATION Infusion for rheumatoid arthritis- unsure of name of medication    . Testosterone Cypionate 200 MG/ML SOLN testosterone cypionate 200 mg/mL intramuscular oil  inject 0.5 milliliter intramuscularly every week     No current facility-administered medications for this visit.     Allergies:   Caffeine; Codeine; Penicillins; Pseudoephedrine; and Simvastatin    Social History:  The patient  reports that he has never smoked. He has never used smokeless tobacco. He reports that he does not  drink alcohol or use drugs.   Family History:  The patient's family history includes Heart attack in his mother; High blood pressure in his mother; Prostate cancer in his father; Stroke in his mother.    ROS:  Please see the history of present illness.   Otherwise, review of systems are positive for none.   All other systems are reviewed and negative.    PHYSICAL EXAM: VS:  There were no vitals taken for this visit. , BMI There is no height or weight on file to calculate BMI. Affect appropriate Healthy:  appears stated age 3: normal Neck supple with no adenopathy JVP normal no bruits no thyromegaly Lungs clear with no wheezing and good diaphragmatic motion Heart:  S1/S2 no murmur, no rub, gallop or click PMI normal Abdomen: benighn, BS positve, no tenderness, no AAA no bruit.  No HSM or HJR Distal pulses intact with no bruits No edema Neuro non-focal Skin warm and dry No muscular weakness    EKG:  NSR artifact normal    Recent Labs: No results found for requested labs within last 8760 hours.    Lipid Panel    Component Value Date/Time   CHOL 197 12/24/2010 1157   TRIG 103.0 12/24/2010 1157   TRIG 164 (H) 09/24/2006 1307   HDL 51.90 12/24/2010 1157   CHOLHDL 4 12/24/2010 1157   VLDL 20.6 12/24/2010 1157   LDLCALC 125 (H) 12/24/2010 1157      Wt Readings from Last 3 Encounters:  04/06/18 216 lb (98 kg)  06/17/13 206 lb 6.4 oz (93.6 kg)  06/14/13 206 lb 6.4 oz (93.6 kg)      Other studies Reviewed: Additional studies/ records that were reviewed today include: Notes from primary and [facility]. Notes from allergy .    ASSESSMENT AND PLAN:  1.  Chest Tightness related to drug reaction. ECG normal no indication for stress testing currently 2.  Dyspnea :  Related to infusion and allergies will get TTE to assess RV/LV function  3. HTN:.  Well controlled.  Continue current medications and low sodium Dash type diet.   4. HLD:  On zetia labs with  primary  5. RA:  F/u rheumatiology ? New infusion exacerbating allergies and asthma 6. Asthma:  May be related to chest tightness and dyspnea ? Previous history of PE With recent V/Q negaqtive No active wheezing Non smoker f/u primary    Current medicines are reviewed at length with the patient today.  The patient does not have concerns regarding medicines.  The following changes have been made:  None   Labs/ tests ordered today include: TTE  No orders of the defined  types were placed in this encounter.    Disposition:   FU with me in 3 months     Signed, Jenkins Rouge, MD  08/16/2018 5:45 PM    [address], [place], [place]  [address] Phone: [phone]; Fax: [phone]

## 2018-08-19 ENCOUNTER — Encounter: Payer: Self-pay | Admitting: Cardiovascular Disease

## 2018-08-19 ENCOUNTER — Ambulatory Visit (INDEPENDENT_AMBULATORY_CARE_PROVIDER_SITE_OTHER): Payer: Medicare Other | Admitting: Cardiovascular Disease

## 2018-08-19 ENCOUNTER — Ambulatory Visit: Payer: PRIVATE HEALTH INSURANCE | Admitting: Cardiovascular Disease

## 2018-08-19 VITALS — BP 124/78 | HR 104 | Ht 69.8 in | Wt 218.2 lb

## 2018-08-19 DIAGNOSIS — E785 Hyperlipidemia, unspecified: Secondary | ICD-10-CM

## 2018-08-19 DIAGNOSIS — R0600 Dyspnea, unspecified: Secondary | ICD-10-CM

## 2018-08-19 DIAGNOSIS — R0789 Other chest pain: Secondary | ICD-10-CM

## 2018-08-19 DIAGNOSIS — I10 Essential (primary) hypertension: Secondary | ICD-10-CM | POA: Diagnosis not present

## 2018-08-19 NOTE — Patient Instructions (Signed)
Medication Instructions:   If you need a refill on your cardiac medications before your next appointment, please call your pharmacy.   Lab work:  If you have labs (blood work) drawn today and your tests are completely normal, you will receive your results only by: Marland Kitchen MyChart Message (if you have MyChart) OR . A paper copy in the mail If you have any lab test that is abnormal or we need to change your treatment, we will call you to review the results.  Testing/Procedures: Your physician has requested that you have an echocardiogram. Echocardiography is a painless test that uses sound waves to create images of your heart. It provides your doctor with information about the size and shape of your heart and how well your heart's chambers and valves are working. This procedure takes approximately one hour. There are no restrictions for this procedure.  Follow-Up: At [facility], you and your health needs are our priority.  As part of our continuing mission to provide you with exceptional heart care, we have created designated Provider Care Teams.  These Care Teams include your primary Cardiologist (physician) and Advanced Practice Providers (APPs -  Physician Assistants and Nurse Practitioners) who all work together to provide you with the care you need, when you need it. You will need a follow up appointment in 3 months.   You may see Jenkins Rouge, MD or one of the following Advanced Practice Providers on your designated Care Team:   Truitt Merle, NP Cecilie Kicks, NP . Kathyrn Drown, NP

## 2018-08-25 ENCOUNTER — Other Ambulatory Visit (HOSPITAL_COMMUNITY): Payer: Medicare Other

## 2018-08-26 DIAGNOSIS — Z6830 Body mass index (BMI) 30.0-30.9, adult: Secondary | ICD-10-CM | POA: Diagnosis not present

## 2018-08-26 DIAGNOSIS — M5136 Other intervertebral disc degeneration, lumbar region: Secondary | ICD-10-CM | POA: Diagnosis not present

## 2018-08-26 DIAGNOSIS — N183 Chronic kidney disease, stage 3 (moderate): Secondary | ICD-10-CM | POA: Diagnosis not present

## 2018-08-26 DIAGNOSIS — E669 Obesity, unspecified: Secondary | ICD-10-CM | POA: Diagnosis not present

## 2018-08-26 DIAGNOSIS — M150 Primary generalized (osteo)arthritis: Secondary | ICD-10-CM | POA: Diagnosis not present

## 2018-08-26 DIAGNOSIS — M1A09X1 Idiopathic chronic gout, multiple sites, with tophus (tophi): Secondary | ICD-10-CM | POA: Diagnosis not present

## 2018-08-26 DIAGNOSIS — M0579 Rheumatoid arthritis with rheumatoid factor of multiple sites without organ or systems involvement: Secondary | ICD-10-CM | POA: Diagnosis not present

## 2018-08-27 ENCOUNTER — Encounter (INDEPENDENT_AMBULATORY_CARE_PROVIDER_SITE_OTHER): Payer: Medicare Other | Admitting: Ophthalmology

## 2018-08-27 DIAGNOSIS — H353221 Exudative age-related macular degeneration, left eye, with active choroidal neovascularization: Secondary | ICD-10-CM | POA: Diagnosis not present

## 2018-08-27 DIAGNOSIS — H35033 Hypertensive retinopathy, bilateral: Secondary | ICD-10-CM | POA: Diagnosis not present

## 2018-08-27 DIAGNOSIS — I10 Essential (primary) hypertension: Secondary | ICD-10-CM

## 2018-08-27 DIAGNOSIS — H353111 Nonexudative age-related macular degeneration, right eye, early dry stage: Secondary | ICD-10-CM | POA: Diagnosis not present

## 2018-08-27 DIAGNOSIS — D3132 Benign neoplasm of left choroid: Secondary | ICD-10-CM | POA: Diagnosis not present

## 2018-08-27 DIAGNOSIS — H43813 Vitreous degeneration, bilateral: Secondary | ICD-10-CM

## 2018-08-27 DIAGNOSIS — H2513 Age-related nuclear cataract, bilateral: Secondary | ICD-10-CM | POA: Diagnosis not present

## 2018-08-30 ENCOUNTER — Ambulatory Visit (HOSPITAL_COMMUNITY): Payer: Medicare Other | Attending: Internal Medicine

## 2018-08-30 DIAGNOSIS — R0600 Dyspnea, unspecified: Secondary | ICD-10-CM | POA: Diagnosis not present

## 2018-09-09 DIAGNOSIS — M5136 Other intervertebral disc degeneration, lumbar region: Secondary | ICD-10-CM | POA: Diagnosis not present

## 2018-09-09 DIAGNOSIS — M25511 Pain in right shoulder: Secondary | ICD-10-CM | POA: Diagnosis not present

## 2018-09-09 DIAGNOSIS — Z79891 Long term (current) use of opiate analgesic: Secondary | ICD-10-CM | POA: Diagnosis not present

## 2018-09-09 DIAGNOSIS — M961 Postlaminectomy syndrome, not elsewhere classified: Secondary | ICD-10-CM | POA: Diagnosis not present

## 2018-09-09 DIAGNOSIS — M25512 Pain in left shoulder: Secondary | ICD-10-CM | POA: Diagnosis not present

## 2018-09-24 ENCOUNTER — Encounter (INDEPENDENT_AMBULATORY_CARE_PROVIDER_SITE_OTHER): Payer: Medicare Other | Admitting: Ophthalmology

## 2018-09-24 DIAGNOSIS — H353111 Nonexudative age-related macular degeneration, right eye, early dry stage: Secondary | ICD-10-CM | POA: Diagnosis not present

## 2018-09-24 DIAGNOSIS — H35033 Hypertensive retinopathy, bilateral: Secondary | ICD-10-CM | POA: Diagnosis not present

## 2018-09-24 DIAGNOSIS — H353221 Exudative age-related macular degeneration, left eye, with active choroidal neovascularization: Secondary | ICD-10-CM

## 2018-09-24 DIAGNOSIS — H43813 Vitreous degeneration, bilateral: Secondary | ICD-10-CM

## 2018-09-24 DIAGNOSIS — D3132 Benign neoplasm of left choroid: Secondary | ICD-10-CM

## 2018-09-24 DIAGNOSIS — I10 Essential (primary) hypertension: Secondary | ICD-10-CM | POA: Diagnosis not present

## 2018-09-27 DIAGNOSIS — J018 Other acute sinusitis: Secondary | ICD-10-CM | POA: Diagnosis not present

## 2018-10-08 DIAGNOSIS — R05 Cough: Secondary | ICD-10-CM | POA: Diagnosis not present

## 2018-10-08 DIAGNOSIS — R0789 Other chest pain: Secondary | ICD-10-CM | POA: Diagnosis not present

## 2018-10-11 DIAGNOSIS — M25512 Pain in left shoulder: Secondary | ICD-10-CM | POA: Diagnosis not present

## 2018-10-11 DIAGNOSIS — M25511 Pain in right shoulder: Secondary | ICD-10-CM | POA: Diagnosis not present

## 2018-10-22 ENCOUNTER — Encounter (INDEPENDENT_AMBULATORY_CARE_PROVIDER_SITE_OTHER): Payer: Medicare Other | Admitting: Ophthalmology

## 2018-10-22 DIAGNOSIS — H2513 Age-related nuclear cataract, bilateral: Secondary | ICD-10-CM

## 2018-10-22 DIAGNOSIS — H353111 Nonexudative age-related macular degeneration, right eye, early dry stage: Secondary | ICD-10-CM

## 2018-10-22 DIAGNOSIS — H353221 Exudative age-related macular degeneration, left eye, with active choroidal neovascularization: Secondary | ICD-10-CM

## 2018-10-22 DIAGNOSIS — H43813 Vitreous degeneration, bilateral: Secondary | ICD-10-CM | POA: Diagnosis not present

## 2018-10-22 DIAGNOSIS — D3132 Benign neoplasm of left choroid: Secondary | ICD-10-CM | POA: Diagnosis not present

## 2018-10-22 DIAGNOSIS — H35033 Hypertensive retinopathy, bilateral: Secondary | ICD-10-CM

## 2018-10-22 DIAGNOSIS — I10 Essential (primary) hypertension: Secondary | ICD-10-CM

## 2018-11-05 DIAGNOSIS — M542 Cervicalgia: Secondary | ICD-10-CM | POA: Diagnosis not present

## 2018-11-05 DIAGNOSIS — M5136 Other intervertebral disc degeneration, lumbar region: Secondary | ICD-10-CM | POA: Diagnosis not present

## 2018-11-05 DIAGNOSIS — M545 Low back pain: Secondary | ICD-10-CM | POA: Diagnosis not present

## 2018-11-05 DIAGNOSIS — M5030 Other cervical disc degeneration, unspecified cervical region: Secondary | ICD-10-CM | POA: Diagnosis not present

## 2018-11-05 DIAGNOSIS — Z79891 Long term (current) use of opiate analgesic: Secondary | ICD-10-CM | POA: Diagnosis not present

## 2018-11-05 DIAGNOSIS — M961 Postlaminectomy syndrome, not elsewhere classified: Secondary | ICD-10-CM | POA: Diagnosis not present

## 2018-11-11 NOTE — Progress Notes (Deleted)
Cardiology Office Note   Date:  11/11/2018   ID:  [name], DOB 03/27/1946, MRN [id number]  PCP:  Gary Roger, MD  Cardiologist:   Gary Rouge, MD   No chief complaint on file.     History of Present Illness:  73 y.o. first seen November 2019 with chest tightness and dyspnea related to med infusion for arthritis.  Patient admitted to [facility] with dyspnea and chest pain. History of PE. History of CKD  V/Q scan negative for PE ECG was non ischemic ? Infiltrative process with low voltage , poor R wave progression and long PR TTE with EF 55-60% borderline LVH.  On f/u just had cough no pain CRF;s HTN, HLD Rx with medrol dose pack for asthma and doxycycline   Also has arthritis previously Rx with Remicade but had too many infections ? New med started Seems that allergies and wheezing worse after 2nd new infusion. Also uses xyzal, allegra and Flonase  Prescribe albuterol inhaler   Retired Administrator. Got remarried after his wife died 4 years ago From [place] in [place] now  No chest pain, palpitations, syncope or edema His PE was 4 years ago or so after some foot surgery.   F/U echo with [place] 08/30/18 showed EF 50% no valve disease Discussed need for f/u EF evaluation in May Suspect cardiac MRI would be best givne suggestion of lateral and apical RWMA;s.  Quantitate EF and R/o infiltrative process with RA low voltage and long PR  ***   Past Medical History:  Diagnosis Date  . Arthritis   . Asthma   . BPH (benign prostatic hyperplasia)   . Complication of anesthesia    PROBLEMS VOIDING AFTER HERNIA REPAIR  . GERD (gastroesophageal reflux disease)   . Gout   . Hyperlipidemia   . Hypertension   . IBS (irritable bowel syndrome)   . Macular degeneration   . Pulmonary embolus ([place]) 09/06/12   HOSP AT [facility] AND PLACED ON COUMADIN  . Rheumatoid arthritis ([place])   . Seasonal allergies   . Urinary retention    HX OF KIDNEY STONES-BILATERAL, HX  BPH  -- PT TOLD HIS KIDNEY FUNCTIONS  ELVATED WHILE HOSP DEC 2013 FOR PULMONARY EMBOLUS    Past Surgical History:  Procedure Laterality Date  . ADENOIDECTOMY    . APPENDECTOMY    . CHOLECYSTECTOMY    . HERNIA REPAIR  2011   umb  . LUMBAR LAMINECTOMY    . RIGHT HAND SURGERY 06/30/12    . TONSILLECTOMY    . TOTAL KNEE ARTHROPLASTY Right 06/17/2013   Procedure: RIGHT TOTAL KNEE ARTHROPLASTY;  Surgeon: Gary Alf, MD;  Location: [facility] ORS;  Service: Orthopedics;  Laterality: Right;     Current Outpatient Medications  Medication Sig Dispense Refill  . albuterol (PROAIR HFA) 108 (90 Base) MCG/ACT inhaler Inhale two puffs every 4-6 hours if needed for cough or wheeze. 3 Inhaler 0  . allopurinol (ZYLOPRIM) 100 MG tablet Take 100 mg by mouth 2 (two) times daily.     Marland Kitchen azelastine (ASTELIN) 0.1 % nasal spray Use two sprays in each nostril twice daily as directed for nasal drainage. 30 mL 5  . doxazosin (CARDURA) 2 MG tablet Take 2 mg by mouth every morning.    . fluticasone (FLONASE) 50 MCG/ACT nasal spray     . furosemide (LASIX) 40 MG tablet Take 40 mg by mouth daily.  3  . HYDROcodone-acetaminophen (NORCO) 10-325 MG tablet Take  1 tablet by mouth 3 (three) times daily as needed.  0  . levocetirizine (XYZAL) 5 MG tablet Take 5 mg by mouth daily as needed. For allergies    . montelukast (SINGULAIR) 10 MG tablet   5  . omeprazole (PRILOSEC) 20 MG capsule Take 20 mg by mouth 2 (two) times daily.    . potassium citrate (UROCIT-K) 10 MEQ (1080 MG) SR tablet Take 10 mEq by mouth 2 (two) times daily.    Marland Kitchen PRESCRIPTION MEDICATION Infusion for rheumatoid arthritis- unsure of name of medication    . Testosterone Cypionate 200 MG/ML SOLN testosterone cypionate 200 mg/mL intramuscular oil  inject 0.5 milliliter intramuscularly every week     No current facility-administered medications for this visit.     Allergies:   Caffeine; Codeine; Penicillins; Pseudoephedrine; and Simvastatin    Social  History:  The patient  reports that he has never smoked. He has never used smokeless tobacco. He reports that he does not drink alcohol or use drugs.   Family History:  The patient's family history includes Heart attack in his mother; High blood pressure in his mother; Prostate cancer in his father; Stroke in his mother.    ROS:  Please see the history of present illness.   Otherwise, review of systems are positive for none.   All other systems are reviewed and negative.    PHYSICAL EXAM: VS:  There were no vitals taken for this visit. , BMI There is no height or weight on file to calculate BMI. Affect appropriate Healthy:  appears stated age 42: normal Neck supple with no adenopathy JVP normal no bruits no thyromegaly Lungs clear with no wheezing and good diaphragmatic motion Heart:  S1/S2 no murmur, no rub, gallop or click PMI normal Abdomen: benighn, BS positve, no tenderness, no AAA no bruit.  No HSM or HJR Distal pulses intact with no bruits No edema Neuro non-focal Skin warm and dry No muscular weakness    EKG:  08/19/18 NSR artifact normal    Recent Labs: No results found for requested labs within last 8760 hours.    Lipid Panel    Component Value Date/Time   CHOL 197 12/24/2010 1157   TRIG 103.0 12/24/2010 1157   TRIG 164 (H) 09/24/2006 1307   HDL 51.90 12/24/2010 1157   CHOLHDL 4 12/24/2010 1157   VLDL 20.6 12/24/2010 1157   LDLCALC 125 (H) 12/24/2010 1157      Wt Readings from Last 3 Encounters:  08/19/18 218 lb 4 oz (99 kg)  04/06/18 216 lb (98 kg)  06/17/13 206 lb 6.4 oz (93.6 kg)      Other studies Reviewed: Additional studies/ records that were reviewed today include: Notes from primary and [facility]. Notes from allergy .    ASSESSMENT AND PLAN:  1.  Chest Tightness related to drug reaction. ECG normal no indication for stress testing currently 2.  Dyspnea :  Related to infusion and allergies TTE 08/30/18 EF 50% will repeat in  May  3. HTN:.  *** 4. HLD:  On zetia labs with primary  5. RA:  F/u rheumatiology ? New infusion exacerbating allergies and asthma 6. Asthma:  May be related to chest tightness and dyspnea ? Previous history of PE With recent V/Q negaqtive No active wheezing Non smoker f/u primary    Current medicines are reviewed at length with the patient today.  The patient does not have concerns regarding medicines.  The following changes have been made:  ***  Labs/ tests ordered today include:TTE f/u May  No orders of the defined types were placed in this encounter.    Disposition:   FU with me in May after repeat TTE     Signed, Gary Rouge, MD  11/11/2018 11:17 AM    [facility] [address], [place], [place]  [address] Phone: [phone]; Fax: [phone]

## 2018-11-15 DIAGNOSIS — M542 Cervicalgia: Secondary | ICD-10-CM | POA: Diagnosis not present

## 2018-11-18 ENCOUNTER — Ambulatory Visit: Payer: Medicare Other | Admitting: Cardiovascular Disease

## 2018-11-22 ENCOUNTER — Encounter (INDEPENDENT_AMBULATORY_CARE_PROVIDER_SITE_OTHER): Payer: Medicare Other | Admitting: Ophthalmology

## 2018-11-22 DIAGNOSIS — H353221 Exudative age-related macular degeneration, left eye, with active choroidal neovascularization: Secondary | ICD-10-CM | POA: Diagnosis not present

## 2018-11-22 DIAGNOSIS — D3132 Benign neoplasm of left choroid: Secondary | ICD-10-CM | POA: Diagnosis not present

## 2018-11-22 DIAGNOSIS — H353111 Nonexudative age-related macular degeneration, right eye, early dry stage: Secondary | ICD-10-CM

## 2018-11-22 DIAGNOSIS — I10 Essential (primary) hypertension: Secondary | ICD-10-CM

## 2018-11-22 DIAGNOSIS — H35033 Hypertensive retinopathy, bilateral: Secondary | ICD-10-CM

## 2018-11-22 DIAGNOSIS — H43813 Vitreous degeneration, bilateral: Secondary | ICD-10-CM

## 2018-11-30 DIAGNOSIS — M5030 Other cervical disc degeneration, unspecified cervical region: Secondary | ICD-10-CM | POA: Diagnosis not present

## 2018-12-02 DIAGNOSIS — M109 Gout, unspecified: Secondary | ICD-10-CM | POA: Diagnosis not present

## 2018-12-02 DIAGNOSIS — E7800 Pure hypercholesterolemia, unspecified: Secondary | ICD-10-CM | POA: Diagnosis not present

## 2018-12-02 DIAGNOSIS — N400 Enlarged prostate without lower urinary tract symptoms: Secondary | ICD-10-CM | POA: Diagnosis not present

## 2018-12-02 DIAGNOSIS — M069 Rheumatoid arthritis, unspecified: Secondary | ICD-10-CM | POA: Diagnosis not present

## 2018-12-02 DIAGNOSIS — N183 Chronic kidney disease, stage 3 (moderate): Secondary | ICD-10-CM | POA: Diagnosis not present

## 2018-12-02 DIAGNOSIS — Z1389 Encounter for screening for other disorder: Secondary | ICD-10-CM | POA: Diagnosis not present

## 2018-12-02 DIAGNOSIS — K589 Irritable bowel syndrome without diarrhea: Secondary | ICD-10-CM | POA: Diagnosis not present

## 2018-12-02 DIAGNOSIS — J4540 Moderate persistent asthma, uncomplicated: Secondary | ICD-10-CM | POA: Diagnosis not present

## 2018-12-02 DIAGNOSIS — E291 Testicular hypofunction: Secondary | ICD-10-CM | POA: Diagnosis not present

## 2018-12-02 DIAGNOSIS — Z6830 Body mass index (BMI) 30.0-30.9, adult: Secondary | ICD-10-CM | POA: Diagnosis not present

## 2018-12-02 DIAGNOSIS — I10 Essential (primary) hypertension: Secondary | ICD-10-CM | POA: Diagnosis not present

## 2018-12-02 DIAGNOSIS — I5032 Chronic diastolic (congestive) heart failure: Secondary | ICD-10-CM | POA: Diagnosis not present

## 2018-12-20 ENCOUNTER — Other Ambulatory Visit: Payer: Self-pay

## 2018-12-20 ENCOUNTER — Encounter (INDEPENDENT_AMBULATORY_CARE_PROVIDER_SITE_OTHER): Payer: Medicare Other | Admitting: Ophthalmology

## 2018-12-20 DIAGNOSIS — H43813 Vitreous degeneration, bilateral: Secondary | ICD-10-CM | POA: Diagnosis not present

## 2018-12-20 DIAGNOSIS — D3132 Benign neoplasm of left choroid: Secondary | ICD-10-CM | POA: Diagnosis not present

## 2018-12-20 DIAGNOSIS — I10 Essential (primary) hypertension: Secondary | ICD-10-CM

## 2018-12-20 DIAGNOSIS — H353112 Nonexudative age-related macular degeneration, right eye, intermediate dry stage: Secondary | ICD-10-CM

## 2018-12-20 DIAGNOSIS — H353221 Exudative age-related macular degeneration, left eye, with active choroidal neovascularization: Secondary | ICD-10-CM | POA: Diagnosis not present

## 2018-12-20 DIAGNOSIS — H35033 Hypertensive retinopathy, bilateral: Secondary | ICD-10-CM | POA: Diagnosis not present

## 2018-12-29 DIAGNOSIS — M5136 Other intervertebral disc degeneration, lumbar region: Secondary | ICD-10-CM | POA: Diagnosis not present

## 2018-12-29 DIAGNOSIS — Z79891 Long term (current) use of opiate analgesic: Secondary | ICD-10-CM | POA: Diagnosis not present

## 2018-12-29 DIAGNOSIS — M961 Postlaminectomy syndrome, not elsewhere classified: Secondary | ICD-10-CM | POA: Diagnosis not present

## 2018-12-29 DIAGNOSIS — M5030 Other cervical disc degeneration, unspecified cervical region: Secondary | ICD-10-CM | POA: Diagnosis not present

## 2018-12-29 DIAGNOSIS — M545 Low back pain: Secondary | ICD-10-CM | POA: Diagnosis not present

## 2019-01-17 ENCOUNTER — Other Ambulatory Visit: Payer: Self-pay

## 2019-01-17 ENCOUNTER — Encounter (INDEPENDENT_AMBULATORY_CARE_PROVIDER_SITE_OTHER): Payer: Medicare Other | Admitting: Ophthalmology

## 2019-01-17 DIAGNOSIS — H353112 Nonexudative age-related macular degeneration, right eye, intermediate dry stage: Secondary | ICD-10-CM

## 2019-01-17 DIAGNOSIS — H35033 Hypertensive retinopathy, bilateral: Secondary | ICD-10-CM | POA: Diagnosis not present

## 2019-01-17 DIAGNOSIS — I10 Essential (primary) hypertension: Secondary | ICD-10-CM | POA: Diagnosis not present

## 2019-01-17 DIAGNOSIS — H353221 Exudative age-related macular degeneration, left eye, with active choroidal neovascularization: Secondary | ICD-10-CM

## 2019-01-17 DIAGNOSIS — H43813 Vitreous degeneration, bilateral: Secondary | ICD-10-CM

## 2019-01-17 DIAGNOSIS — D3132 Benign neoplasm of left choroid: Secondary | ICD-10-CM | POA: Diagnosis not present

## 2019-01-24 DIAGNOSIS — M069 Rheumatoid arthritis, unspecified: Secondary | ICD-10-CM | POA: Diagnosis not present

## 2019-02-14 ENCOUNTER — Encounter (INDEPENDENT_AMBULATORY_CARE_PROVIDER_SITE_OTHER): Payer: Medicare Other | Admitting: Ophthalmology

## 2019-02-14 ENCOUNTER — Other Ambulatory Visit: Payer: Self-pay

## 2019-02-14 DIAGNOSIS — H353221 Exudative age-related macular degeneration, left eye, with active choroidal neovascularization: Secondary | ICD-10-CM | POA: Diagnosis not present

## 2019-02-14 DIAGNOSIS — H43813 Vitreous degeneration, bilateral: Secondary | ICD-10-CM

## 2019-02-14 DIAGNOSIS — H353111 Nonexudative age-related macular degeneration, right eye, early dry stage: Secondary | ICD-10-CM

## 2019-02-14 DIAGNOSIS — H35033 Hypertensive retinopathy, bilateral: Secondary | ICD-10-CM | POA: Diagnosis not present

## 2019-02-14 DIAGNOSIS — I10 Essential (primary) hypertension: Secondary | ICD-10-CM | POA: Diagnosis not present

## 2019-02-14 DIAGNOSIS — H2513 Age-related nuclear cataract, bilateral: Secondary | ICD-10-CM

## 2019-02-14 DIAGNOSIS — D3132 Benign neoplasm of left choroid: Secondary | ICD-10-CM | POA: Diagnosis not present

## 2019-02-23 DIAGNOSIS — N200 Calculus of kidney: Secondary | ICD-10-CM | POA: Diagnosis not present

## 2019-02-23 DIAGNOSIS — H353290 Exudative age-related macular degeneration, unspecified eye, stage unspecified: Secondary | ICD-10-CM | POA: Insufficient documentation

## 2019-02-23 DIAGNOSIS — M109 Gout, unspecified: Secondary | ICD-10-CM | POA: Diagnosis not present

## 2019-02-23 DIAGNOSIS — M1990 Unspecified osteoarthritis, unspecified site: Secondary | ICD-10-CM | POA: Diagnosis not present

## 2019-02-23 DIAGNOSIS — K219 Gastro-esophageal reflux disease without esophagitis: Secondary | ICD-10-CM | POA: Diagnosis not present

## 2019-02-23 DIAGNOSIS — J45909 Unspecified asthma, uncomplicated: Secondary | ICD-10-CM | POA: Diagnosis not present

## 2019-02-23 DIAGNOSIS — E669 Obesity, unspecified: Secondary | ICD-10-CM | POA: Diagnosis not present

## 2019-02-23 DIAGNOSIS — Z1331 Encounter for screening for depression: Secondary | ICD-10-CM | POA: Diagnosis not present

## 2019-02-23 DIAGNOSIS — N289 Disorder of kidney and ureter, unspecified: Secondary | ICD-10-CM | POA: Diagnosis not present

## 2019-02-23 DIAGNOSIS — K5900 Constipation, unspecified: Secondary | ICD-10-CM | POA: Diagnosis not present

## 2019-02-23 DIAGNOSIS — J189 Pneumonia, unspecified organism: Secondary | ICD-10-CM | POA: Insufficient documentation

## 2019-02-23 DIAGNOSIS — Z1339 Encounter for screening examination for other mental health and behavioral disorders: Secondary | ICD-10-CM | POA: Diagnosis not present

## 2019-02-23 DIAGNOSIS — R948 Abnormal results of function studies of other organs and systems: Secondary | ICD-10-CM | POA: Diagnosis not present

## 2019-02-23 DIAGNOSIS — Z0000 Encounter for general adult medical examination without abnormal findings: Secondary | ICD-10-CM | POA: Insufficient documentation

## 2019-02-23 DIAGNOSIS — H353220 Exudative age-related macular degeneration, left eye, stage unspecified: Secondary | ICD-10-CM | POA: Diagnosis not present

## 2019-02-23 DIAGNOSIS — E291 Testicular hypofunction: Secondary | ICD-10-CM | POA: Diagnosis not present

## 2019-03-01 DIAGNOSIS — M47812 Spondylosis without myelopathy or radiculopathy, cervical region: Secondary | ICD-10-CM | POA: Diagnosis not present

## 2019-03-02 DIAGNOSIS — N5201 Erectile dysfunction due to arterial insufficiency: Secondary | ICD-10-CM | POA: Diagnosis not present

## 2019-03-02 DIAGNOSIS — R350 Frequency of micturition: Secondary | ICD-10-CM | POA: Diagnosis not present

## 2019-03-02 DIAGNOSIS — E291 Testicular hypofunction: Secondary | ICD-10-CM | POA: Diagnosis not present

## 2019-03-02 DIAGNOSIS — N401 Enlarged prostate with lower urinary tract symptoms: Secondary | ICD-10-CM | POA: Diagnosis not present

## 2019-03-14 ENCOUNTER — Other Ambulatory Visit: Payer: Self-pay

## 2019-03-14 ENCOUNTER — Encounter (INDEPENDENT_AMBULATORY_CARE_PROVIDER_SITE_OTHER): Payer: Medicare Other | Admitting: Ophthalmology

## 2019-03-14 DIAGNOSIS — D3132 Benign neoplasm of left choroid: Secondary | ICD-10-CM | POA: Diagnosis not present

## 2019-03-14 DIAGNOSIS — H353121 Nonexudative age-related macular degeneration, left eye, early dry stage: Secondary | ICD-10-CM

## 2019-03-14 DIAGNOSIS — I10 Essential (primary) hypertension: Secondary | ICD-10-CM

## 2019-03-14 DIAGNOSIS — H35033 Hypertensive retinopathy, bilateral: Secondary | ICD-10-CM | POA: Diagnosis not present

## 2019-03-14 DIAGNOSIS — H353211 Exudative age-related macular degeneration, right eye, with active choroidal neovascularization: Secondary | ICD-10-CM | POA: Diagnosis not present

## 2019-03-14 DIAGNOSIS — H2513 Age-related nuclear cataract, bilateral: Secondary | ICD-10-CM | POA: Diagnosis not present

## 2019-03-14 DIAGNOSIS — H43813 Vitreous degeneration, bilateral: Secondary | ICD-10-CM | POA: Diagnosis not present

## 2019-04-11 ENCOUNTER — Other Ambulatory Visit: Payer: Self-pay

## 2019-04-11 ENCOUNTER — Encounter (INDEPENDENT_AMBULATORY_CARE_PROVIDER_SITE_OTHER): Payer: Medicare Other | Admitting: Ophthalmology

## 2019-04-11 DIAGNOSIS — H43813 Vitreous degeneration, bilateral: Secondary | ICD-10-CM

## 2019-04-11 DIAGNOSIS — H353221 Exudative age-related macular degeneration, left eye, with active choroidal neovascularization: Secondary | ICD-10-CM

## 2019-04-11 DIAGNOSIS — H353112 Nonexudative age-related macular degeneration, right eye, intermediate dry stage: Secondary | ICD-10-CM | POA: Diagnosis not present

## 2019-04-11 DIAGNOSIS — H35033 Hypertensive retinopathy, bilateral: Secondary | ICD-10-CM

## 2019-04-11 DIAGNOSIS — H2513 Age-related nuclear cataract, bilateral: Secondary | ICD-10-CM

## 2019-04-11 DIAGNOSIS — D3132 Benign neoplasm of left choroid: Secondary | ICD-10-CM

## 2019-04-11 DIAGNOSIS — I10 Essential (primary) hypertension: Secondary | ICD-10-CM | POA: Diagnosis not present

## 2019-04-19 DIAGNOSIS — Z79891 Long term (current) use of opiate analgesic: Secondary | ICD-10-CM | POA: Diagnosis not present

## 2019-04-19 DIAGNOSIS — M5136 Other intervertebral disc degeneration, lumbar region: Secondary | ICD-10-CM | POA: Diagnosis not present

## 2019-05-09 ENCOUNTER — Other Ambulatory Visit: Payer: Self-pay

## 2019-05-09 ENCOUNTER — Encounter (INDEPENDENT_AMBULATORY_CARE_PROVIDER_SITE_OTHER): Payer: Medicare Other | Admitting: Ophthalmology

## 2019-05-09 DIAGNOSIS — H43813 Vitreous degeneration, bilateral: Secondary | ICD-10-CM | POA: Diagnosis not present

## 2019-05-09 DIAGNOSIS — D3132 Benign neoplasm of left choroid: Secondary | ICD-10-CM

## 2019-05-09 DIAGNOSIS — H353112 Nonexudative age-related macular degeneration, right eye, intermediate dry stage: Secondary | ICD-10-CM | POA: Diagnosis not present

## 2019-05-09 DIAGNOSIS — H353221 Exudative age-related macular degeneration, left eye, with active choroidal neovascularization: Secondary | ICD-10-CM | POA: Diagnosis not present

## 2019-05-09 DIAGNOSIS — H35033 Hypertensive retinopathy, bilateral: Secondary | ICD-10-CM

## 2019-05-09 DIAGNOSIS — I10 Essential (primary) hypertension: Secondary | ICD-10-CM | POA: Diagnosis not present

## 2019-05-20 DIAGNOSIS — L820 Inflamed seborrheic keratosis: Secondary | ICD-10-CM | POA: Diagnosis not present

## 2019-05-20 DIAGNOSIS — L578 Other skin changes due to chronic exposure to nonionizing radiation: Secondary | ICD-10-CM | POA: Diagnosis not present

## 2019-05-20 DIAGNOSIS — L821 Other seborrheic keratosis: Secondary | ICD-10-CM | POA: Diagnosis not present

## 2019-05-20 DIAGNOSIS — L720 Epidermal cyst: Secondary | ICD-10-CM | POA: Diagnosis not present

## 2019-05-20 DIAGNOSIS — L570 Actinic keratosis: Secondary | ICD-10-CM | POA: Diagnosis not present

## 2019-05-24 DIAGNOSIS — J329 Chronic sinusitis, unspecified: Secondary | ICD-10-CM | POA: Diagnosis not present

## 2019-05-24 DIAGNOSIS — I129 Hypertensive chronic kidney disease with stage 1 through stage 4 chronic kidney disease, or unspecified chronic kidney disease: Secondary | ICD-10-CM | POA: Insufficient documentation

## 2019-05-24 DIAGNOSIS — N183 Chronic kidney disease, stage 3 (moderate): Secondary | ICD-10-CM | POA: Diagnosis not present

## 2019-06-06 ENCOUNTER — Encounter (INDEPENDENT_AMBULATORY_CARE_PROVIDER_SITE_OTHER): Payer: Medicare Other | Admitting: Ophthalmology

## 2019-06-06 ENCOUNTER — Other Ambulatory Visit: Payer: Self-pay

## 2019-06-06 DIAGNOSIS — H353221 Exudative age-related macular degeneration, left eye, with active choroidal neovascularization: Secondary | ICD-10-CM | POA: Diagnosis not present

## 2019-06-06 DIAGNOSIS — H353112 Nonexudative age-related macular degeneration, right eye, intermediate dry stage: Secondary | ICD-10-CM

## 2019-06-06 DIAGNOSIS — H2513 Age-related nuclear cataract, bilateral: Secondary | ICD-10-CM | POA: Diagnosis not present

## 2019-06-06 DIAGNOSIS — I10 Essential (primary) hypertension: Secondary | ICD-10-CM | POA: Diagnosis not present

## 2019-06-06 DIAGNOSIS — H35033 Hypertensive retinopathy, bilateral: Secondary | ICD-10-CM

## 2019-06-06 DIAGNOSIS — D3132 Benign neoplasm of left choroid: Secondary | ICD-10-CM

## 2019-06-06 DIAGNOSIS — H43813 Vitreous degeneration, bilateral: Secondary | ICD-10-CM

## 2019-06-16 DIAGNOSIS — M47812 Spondylosis without myelopathy or radiculopathy, cervical region: Secondary | ICD-10-CM | POA: Diagnosis not present

## 2019-06-16 DIAGNOSIS — Z79891 Long term (current) use of opiate analgesic: Secondary | ICD-10-CM | POA: Diagnosis not present

## 2019-06-30 DIAGNOSIS — N183 Chronic kidney disease, stage 3 (moderate): Secondary | ICD-10-CM | POA: Diagnosis not present

## 2019-07-07 DIAGNOSIS — N183 Chronic kidney disease, stage 3 unspecified: Secondary | ICD-10-CM | POA: Diagnosis not present

## 2019-07-07 DIAGNOSIS — N2581 Secondary hyperparathyroidism of renal origin: Secondary | ICD-10-CM | POA: Diagnosis not present

## 2019-07-07 DIAGNOSIS — M069 Rheumatoid arthritis, unspecified: Secondary | ICD-10-CM | POA: Diagnosis not present

## 2019-07-07 DIAGNOSIS — Z8719 Personal history of other diseases of the digestive system: Secondary | ICD-10-CM | POA: Diagnosis not present

## 2019-07-07 DIAGNOSIS — M519 Unspecified thoracic, thoracolumbar and lumbosacral intervertebral disc disorder: Secondary | ICD-10-CM | POA: Diagnosis not present

## 2019-07-07 DIAGNOSIS — I129 Hypertensive chronic kidney disease with stage 1 through stage 4 chronic kidney disease, or unspecified chronic kidney disease: Secondary | ICD-10-CM | POA: Diagnosis not present

## 2019-07-07 DIAGNOSIS — Z9049 Acquired absence of other specified parts of digestive tract: Secondary | ICD-10-CM | POA: Diagnosis not present

## 2019-07-07 DIAGNOSIS — I82402 Acute embolism and thrombosis of unspecified deep veins of left lower extremity: Secondary | ICD-10-CM | POA: Diagnosis not present

## 2019-07-07 DIAGNOSIS — Z9889 Other specified postprocedural states: Secondary | ICD-10-CM | POA: Diagnosis not present

## 2019-07-18 ENCOUNTER — Encounter (INDEPENDENT_AMBULATORY_CARE_PROVIDER_SITE_OTHER): Payer: Medicare Other | Admitting: Ophthalmology

## 2019-07-18 DIAGNOSIS — E291 Testicular hypofunction: Secondary | ICD-10-CM | POA: Diagnosis not present

## 2019-07-18 DIAGNOSIS — E669 Obesity, unspecified: Secondary | ICD-10-CM | POA: Diagnosis not present

## 2019-07-18 DIAGNOSIS — H353221 Exudative age-related macular degeneration, left eye, with active choroidal neovascularization: Secondary | ICD-10-CM

## 2019-07-18 DIAGNOSIS — I129 Hypertensive chronic kidney disease with stage 1 through stage 4 chronic kidney disease, or unspecified chronic kidney disease: Secondary | ICD-10-CM | POA: Diagnosis not present

## 2019-07-18 DIAGNOSIS — N183 Chronic kidney disease, stage 3 unspecified: Secondary | ICD-10-CM | POA: Diagnosis not present

## 2019-07-18 DIAGNOSIS — H35033 Hypertensive retinopathy, bilateral: Secondary | ICD-10-CM

## 2019-07-18 DIAGNOSIS — I10 Essential (primary) hypertension: Secondary | ICD-10-CM | POA: Diagnosis not present

## 2019-07-18 DIAGNOSIS — H353112 Nonexudative age-related macular degeneration, right eye, intermediate dry stage: Secondary | ICD-10-CM

## 2019-07-18 DIAGNOSIS — N200 Calculus of kidney: Secondary | ICD-10-CM | POA: Diagnosis not present

## 2019-07-18 DIAGNOSIS — D3132 Benign neoplasm of left choroid: Secondary | ICD-10-CM | POA: Diagnosis not present

## 2019-07-18 DIAGNOSIS — M1990 Unspecified osteoarthritis, unspecified site: Secondary | ICD-10-CM | POA: Diagnosis not present

## 2019-07-18 DIAGNOSIS — J309 Allergic rhinitis, unspecified: Secondary | ICD-10-CM | POA: Diagnosis not present

## 2019-07-18 DIAGNOSIS — H43813 Vitreous degeneration, bilateral: Secondary | ICD-10-CM | POA: Diagnosis not present

## 2019-07-25 DIAGNOSIS — Z23 Encounter for immunization: Secondary | ICD-10-CM | POA: Diagnosis not present

## 2019-08-06 DIAGNOSIS — M5136 Other intervertebral disc degeneration, lumbar region: Secondary | ICD-10-CM | POA: Diagnosis not present

## 2019-08-25 ENCOUNTER — Encounter (INDEPENDENT_AMBULATORY_CARE_PROVIDER_SITE_OTHER): Payer: Medicare Other | Admitting: Ophthalmology

## 2019-08-25 DIAGNOSIS — H43813 Vitreous degeneration, bilateral: Secondary | ICD-10-CM | POA: Diagnosis not present

## 2019-08-25 DIAGNOSIS — H2513 Age-related nuclear cataract, bilateral: Secondary | ICD-10-CM

## 2019-08-25 DIAGNOSIS — I10 Essential (primary) hypertension: Secondary | ICD-10-CM

## 2019-08-25 DIAGNOSIS — H353112 Nonexudative age-related macular degeneration, right eye, intermediate dry stage: Secondary | ICD-10-CM

## 2019-08-25 DIAGNOSIS — H35033 Hypertensive retinopathy, bilateral: Secondary | ICD-10-CM

## 2019-08-25 DIAGNOSIS — H353221 Exudative age-related macular degeneration, left eye, with active choroidal neovascularization: Secondary | ICD-10-CM

## 2019-08-25 DIAGNOSIS — D3132 Benign neoplasm of left choroid: Secondary | ICD-10-CM

## 2019-09-15 DIAGNOSIS — M5030 Other cervical disc degeneration, unspecified cervical region: Secondary | ICD-10-CM | POA: Diagnosis not present

## 2019-09-15 DIAGNOSIS — M545 Low back pain: Secondary | ICD-10-CM | POA: Diagnosis not present

## 2019-09-15 DIAGNOSIS — Z79891 Long term (current) use of opiate analgesic: Secondary | ICD-10-CM | POA: Diagnosis not present

## 2019-09-15 DIAGNOSIS — M961 Postlaminectomy syndrome, not elsewhere classified: Secondary | ICD-10-CM | POA: Diagnosis not present

## 2019-09-15 DIAGNOSIS — Z79899 Other long term (current) drug therapy: Secondary | ICD-10-CM | POA: Diagnosis not present

## 2019-09-15 DIAGNOSIS — M5136 Other intervertebral disc degeneration, lumbar region: Secondary | ICD-10-CM | POA: Diagnosis not present

## 2019-09-20 DIAGNOSIS — M47812 Spondylosis without myelopathy or radiculopathy, cervical region: Secondary | ICD-10-CM | POA: Diagnosis not present

## 2019-09-27 ENCOUNTER — Encounter (INDEPENDENT_AMBULATORY_CARE_PROVIDER_SITE_OTHER): Payer: Medicare Other | Admitting: Ophthalmology

## 2019-09-27 DIAGNOSIS — H43813 Vitreous degeneration, bilateral: Secondary | ICD-10-CM

## 2019-09-27 DIAGNOSIS — I10 Essential (primary) hypertension: Secondary | ICD-10-CM | POA: Diagnosis not present

## 2019-09-27 DIAGNOSIS — H353221 Exudative age-related macular degeneration, left eye, with active choroidal neovascularization: Secondary | ICD-10-CM

## 2019-09-27 DIAGNOSIS — H35033 Hypertensive retinopathy, bilateral: Secondary | ICD-10-CM

## 2019-09-27 DIAGNOSIS — H2513 Age-related nuclear cataract, bilateral: Secondary | ICD-10-CM

## 2019-09-27 DIAGNOSIS — H353112 Nonexudative age-related macular degeneration, right eye, intermediate dry stage: Secondary | ICD-10-CM | POA: Diagnosis not present

## 2019-09-27 DIAGNOSIS — D3132 Benign neoplasm of left choroid: Secondary | ICD-10-CM

## 2019-10-31 ENCOUNTER — Encounter (INDEPENDENT_AMBULATORY_CARE_PROVIDER_SITE_OTHER): Payer: Medicare Other | Admitting: Ophthalmology

## 2019-10-31 DIAGNOSIS — D3132 Benign neoplasm of left choroid: Secondary | ICD-10-CM

## 2019-10-31 DIAGNOSIS — H35033 Hypertensive retinopathy, bilateral: Secondary | ICD-10-CM

## 2019-10-31 DIAGNOSIS — H43813 Vitreous degeneration, bilateral: Secondary | ICD-10-CM | POA: Diagnosis not present

## 2019-10-31 DIAGNOSIS — H353221 Exudative age-related macular degeneration, left eye, with active choroidal neovascularization: Secondary | ICD-10-CM

## 2019-10-31 DIAGNOSIS — H353112 Nonexudative age-related macular degeneration, right eye, intermediate dry stage: Secondary | ICD-10-CM | POA: Diagnosis not present

## 2019-10-31 DIAGNOSIS — I10 Essential (primary) hypertension: Secondary | ICD-10-CM

## 2019-11-09 ENCOUNTER — Other Ambulatory Visit: Payer: Self-pay | Admitting: Allergy

## 2019-11-15 ENCOUNTER — Ambulatory Visit (INDEPENDENT_AMBULATORY_CARE_PROVIDER_SITE_OTHER): Payer: Medicare Other | Admitting: Allergy

## 2019-11-15 ENCOUNTER — Encounter: Payer: Self-pay | Admitting: Allergy

## 2019-11-15 ENCOUNTER — Other Ambulatory Visit: Payer: Self-pay

## 2019-11-15 VITALS — BP 102/64 | HR 72 | Temp 98.9°F | Resp 14 | Ht 69.7 in | Wt 218.0 lb

## 2019-11-15 DIAGNOSIS — R062 Wheezing: Secondary | ICD-10-CM

## 2019-11-15 DIAGNOSIS — J018 Other acute sinusitis: Secondary | ICD-10-CM

## 2019-11-15 DIAGNOSIS — J310 Chronic rhinitis: Secondary | ICD-10-CM

## 2019-11-15 MED ORDER — AMOXICILLIN-POT CLAVULANATE 875-125 MG PO TABS: ORAL_TABLET | ORAL | 0 refills | Status: DC

## 2019-11-15 MED ORDER — AZELASTINE HCL 0.1 % NA SOLN: NASAL | 5 refills | Status: DC

## 2019-11-15 NOTE — Patient Instructions (Addendum)
Acute Sinusitis  - recent treatment for sinusitis with Augmentin and oral steroids with incomplete improvement in symptoms.  Thus will extend Augmentin course for additional 10 days  - continue nasal spray regimen as below  Rhinitis, non-allergic    - environmental allergy panel obtained in 2019 was negative       - would continue xyzal 5mg  daily at this time    - use flonase if you are having nasal congestion.  Use 2 sprays each nostril daily for 1-2 weeks at a time before stopping once improved.  If you are not having any congestion then hold flonase    - for nasal drainage/runny nose recommend use of nasal antihistamine, Astelin or Azelastin, 2 sprays each nostril twice a day   Wheezing   - have access to albuterol inhaler 2 puffs every 4-6 hours as needed for cough/wheeze/shortness of breath/chest tightness.  May use 15-20 minutes prior to activity.   Monitor frequency of use.     - provided with spacer chamber to use with your inhaler for better administration of the medication  Asthma control goals:   Full participation in all desired activities (may need albuterol before activity)  Albuterol use two time or less a week on average (not counting use with activity)  Cough interfering with sleep two time or less a month  Oral steroids no more than once a year  No hospitalizations  Follow-up  4-6 months or sooner if needed

## 2019-11-15 NOTE — Progress Notes (Signed)
Follow-up Note  RE: [name] MRN: [id number] DOB: [birth year] Date of Office Visit: 11/15/2019   History of present illness: [name] is a 74 y.o. male presenting today for follow-up of rhinitis and wheezing.  He was last seen in the office on 04/06/2018 by myself.  He returns today as he was denied a refill by his pharmacy as he needed an office visit prior to refill.  He states that the [place] works very well in decreasing his nasal drainage when he does need to use it.  He also states that the Flonase helps with control of his nasal congestion.  However about 3 to 4 weeks ago he developed increase in sinus pressure, nasal congestion and drainage as well as ear pressure and cough that was triggered by drainage.  He states he saw his PCP and was prescribed Augmentin that he believes he took for 10 days as well as a steroid pack.  He states that he did have improvement in his symptoms but not complete resolution with this regimen.  He is not having any fevers at this time. He states he did stop the Singulair as he thought it was making him have abdominal pain and when he did stop the Singulair the pain resolved. He states with having to wear a mask if he gets too hot that he does have more cough or shortness of breath.  He is not sure if the albuterol is that helpful.  He does not have a spacer device at this time. He is no longer doing the Remicade infusion or any infusions with his rheumatologist.  He states he was just having difficulties when he was on this infusion.   Review of systems: Review of Systems  Constitutional: Negative for chills and fever.  HENT: Positive for congestion, sinus pain and sore throat. Negative for nosebleeds.   Eyes: Negative.   Respiratory: Positive for cough and shortness of breath.   Cardiovascular: Negative.   Gastrointestinal: Negative.   Musculoskeletal: Negative.   Skin: Positive for itching (Ears).  Neurological: Negative.     All  other systems negative unless noted above in HPI  Past medical/social/surgical/family history have been reviewed and are unchanged unless specifically indicated below.  No changes  Medication List: Current Outpatient Medications  Medication Sig Dispense Refill  . albuterol (PROAIR HFA) 108 (90 Base) MCG/ACT inhaler Inhale two puffs every 4-6 hours if needed for cough or wheeze. 3 Inhaler 0  . allopurinol (ZYLOPRIM) 100 MG tablet Take 100 mg by mouth 2 (two) times daily.     Marland Kitchen amLODipine (NORVASC) 2.5 MG tablet Take 2.5 mg by mouth daily.    Marland Kitchen azelastine (ASTELIN) 0.1 % nasal spray Use two sprays in each nostril twice daily as directed for nasal drainage. 30 mL 5  . doxazosin (CARDURA) 2 MG tablet Take 2 mg by mouth every morning.    . fluticasone (FLONASE) 50 MCG/ACT nasal spray Place 1 spray into both nostrils daily.     . furosemide (LASIX) 40 MG tablet Take 40 mg by mouth daily.  3  . HYDROcodone-acetaminophen (NORCO) 10-325 MG tablet Take 1 tablet by mouth 3 (three) times daily as needed.  0  . levocetirizine (XYZAL) 5 MG tablet Take 5 mg by mouth daily as needed. For allergies    . omeprazole (PRILOSEC) 20 MG capsule Take 20 mg by mouth 2 (two) times daily.    . potassium citrate (UROCIT-K) 10 MEQ (1080 MG) SR tablet Take 10  mEq by mouth 2 (two) times daily.    . Testosterone Cypionate 200 MG/ML SOLN testosterone cypionate 200 mg/mL intramuscular oil  inject 0.5 milliliter intramuscularly every week     No current facility-administered medications for this visit.     Known medication allergies: Allergies  Allergen Reactions  . Caffeine     Urinary retention  . Codeine     "feel jittery"  . Iodinated Diagnostic Agents   . Penicillins Itching  . Pseudoephedrine     Urinary retention  . Simvastatin Other (See Comments)    Hurt my bones     Physical examination: Blood pressure 102/64, pulse 72, temperature 98.9 F (37.2 C), temperature source Temporal, resp. rate 14,  height 5' 9.7" (1.77 m), weight 218 lb (98.9 kg), SpO2 95 %.  General: Alert, interactive, in no acute distress. HEENT: PERRLA, TMs pearly gray, turbinates moderately edematous with clear discharge, post-pharynx non erythematous. Neck: Supple without lymphadenopathy. Lungs: Clear to auscultation without wheezing, rhonchi or rales. {no increased work of breathing. CV: Normal S1, S2 without murmurs. Abdomen: Nondistended, nontender. Skin: Warm and dry, without lesions or rashes. Extremities:  No clubbing, cyanosis or edema. Neuro:   Grossly intact.  Diagnositics/Labs: Labs:  Component     Latest Ref Rng & Units 04/06/2018  IgE (Immunoglobulin E), Serum     6 - 495 IU/mL 5 (L)  D Pteronyssinus IgE     Class 0 kU/L <0.10  D Farinae IgE     Class 0 kU/L <0.10  Cat Dander IgE     Class 0 kU/L <0.10  Dog Dander IgE     Class 0 kU/L <0.10  [place] Grass IgE     Class 0 kU/L <0.10  Timothy Grass IgE     Class 0 kU/L <0.10  [place] Grass IgE     Class 0 kU/L <0.10  Cockroach, German IgE     Class 0 kU/L <0.10  Penicillium Chrysogen IgE     Class 0 kU/L <0.10  Cladosporium Herbarum IgE     Class 0 kU/L <0.10  Aspergillus Fumigatus IgE     Class 0 kU/L <0.10  Alternaria Alternata IgE     Class 0 kU/L <0.10  Maple/Box Elder IgE     Class 0 kU/L <0.10  Common Silver Wendee Copp IgE     Class 0 kU/L <0.10  Cedar, [place] IgE     Class 0 kU/L <0.10  Oak, White IgE     Class 0 kU/L <0.10  Elm, American IgE     Class 0 kU/L <0.10  Cottonwood IgE     Class 0 kU/L <0.10  Pecan, Hickory IgE     Class 0 kU/L <0.10  White Mulberry IgE     Class 0 kU/L <0.10  Ragweed, Short IgE     Class 0 kU/L <0.10  Pigweed, Rough IgE     Class 0 kU/L <0.10  Sheep Sorrel IgE Qn     Class 0 kU/L <0.10  Mouse Urine IgE     Class 0 kU/L <0.10    Spirometry: FEV1: 1.57L 52%, FVC: 2.52L 60%, ratio consistent with Mixed obstructive restrictive pattern   Assessment and plan:   Acute Sinusitis  -  recent treatment for sinusitis with Augmentin and oral steroids with incomplete improvement in symptoms.  Thus will extend Augmentin course for additional 10 days  - continue nasal spray regimen as below  Rhinitis, non-allergic    - environmental allergy panel obtained in 2019 was negative       -  would continue xyzal 5mg  daily at this time    - use flonase if you are having nasal congestion.  Use 2 sprays each nostril daily for 1-2 weeks at a time before stopping once improved.  If you are not having any congestion then hold flonase    - for nasal drainage/runny nose recommend use of nasal antihistamine, Astelin or Azelastin, 2 sprays each nostril twice a day   Wheezing   - have access to albuterol inhaler 2 puffs every 4-6 hours as needed for cough/wheeze/shortness of breath/chest tightness.  May use 15-20 minutes prior to activity.   Monitor frequency of use.     - provided with spacer chamber to use with your inhaler for better administration of the medication  Asthma control goals:   Full participation in all desired activities (may need albuterol before activity)  Albuterol use two time or less a week on average (not counting use with activity)  Cough interfering with sleep two time or less a month  Oral steroids no more than once a year  No hospitalizations  Follow-up  4-6 months or sooner if needed  I appreciate the opportunity to take part in Laroy's care. Please do not hesitate to contact me with questions.  Sincerely,   Prudy Feeler, MD Allergy/Immunology [facility] [place] [facility] [place]

## 2019-12-05 ENCOUNTER — Other Ambulatory Visit: Payer: Self-pay

## 2019-12-05 ENCOUNTER — Encounter (INDEPENDENT_AMBULATORY_CARE_PROVIDER_SITE_OTHER): Payer: Medicare Other | Admitting: Ophthalmology

## 2019-12-05 DIAGNOSIS — H43813 Vitreous degeneration, bilateral: Secondary | ICD-10-CM | POA: Diagnosis not present

## 2019-12-05 DIAGNOSIS — I10 Essential (primary) hypertension: Secondary | ICD-10-CM

## 2019-12-05 DIAGNOSIS — H353112 Nonexudative age-related macular degeneration, right eye, intermediate dry stage: Secondary | ICD-10-CM | POA: Diagnosis not present

## 2019-12-05 DIAGNOSIS — D3132 Benign neoplasm of left choroid: Secondary | ICD-10-CM

## 2019-12-05 DIAGNOSIS — H35033 Hypertensive retinopathy, bilateral: Secondary | ICD-10-CM

## 2019-12-05 DIAGNOSIS — H353221 Exudative age-related macular degeneration, left eye, with active choroidal neovascularization: Secondary | ICD-10-CM

## 2019-12-06 DIAGNOSIS — Z23 Encounter for immunization: Secondary | ICD-10-CM | POA: Diagnosis not present

## 2019-12-27 DIAGNOSIS — I129 Hypertensive chronic kidney disease with stage 1 through stage 4 chronic kidney disease, or unspecified chronic kidney disease: Secondary | ICD-10-CM | POA: Diagnosis not present

## 2019-12-27 DIAGNOSIS — E291 Testicular hypofunction: Secondary | ICD-10-CM | POA: Diagnosis not present

## 2019-12-27 DIAGNOSIS — N183 Chronic kidney disease, stage 3 unspecified: Secondary | ICD-10-CM | POA: Diagnosis not present

## 2019-12-27 DIAGNOSIS — Z0000 Encounter for general adult medical examination without abnormal findings: Secondary | ICD-10-CM | POA: Diagnosis not present

## 2019-12-29 DIAGNOSIS — M5136 Other intervertebral disc degeneration, lumbar region: Secondary | ICD-10-CM | POA: Diagnosis not present

## 2019-12-29 DIAGNOSIS — M961 Postlaminectomy syndrome, not elsewhere classified: Secondary | ICD-10-CM | POA: Diagnosis not present

## 2019-12-30 ENCOUNTER — Telehealth: Payer: Self-pay | Admitting: Family

## 2019-12-30 NOTE — Telephone Encounter (Signed)
Schedule new patient appt on 01/23/20 at 0900. Mailed appointment letter giving patient date/time/location

## 2020-01-09 ENCOUNTER — Encounter (INDEPENDENT_AMBULATORY_CARE_PROVIDER_SITE_OTHER): Payer: Medicare Other | Admitting: Ophthalmology

## 2020-01-09 DIAGNOSIS — H35033 Hypertensive retinopathy, bilateral: Secondary | ICD-10-CM | POA: Diagnosis not present

## 2020-01-09 DIAGNOSIS — H43813 Vitreous degeneration, bilateral: Secondary | ICD-10-CM

## 2020-01-09 DIAGNOSIS — I10 Essential (primary) hypertension: Secondary | ICD-10-CM

## 2020-01-09 DIAGNOSIS — H353221 Exudative age-related macular degeneration, left eye, with active choroidal neovascularization: Secondary | ICD-10-CM | POA: Diagnosis not present

## 2020-01-09 DIAGNOSIS — H2513 Age-related nuclear cataract, bilateral: Secondary | ICD-10-CM

## 2020-01-09 DIAGNOSIS — D3132 Benign neoplasm of left choroid: Secondary | ICD-10-CM | POA: Diagnosis not present

## 2020-01-09 DIAGNOSIS — H353112 Nonexudative age-related macular degeneration, right eye, intermediate dry stage: Secondary | ICD-10-CM

## 2020-01-12 DIAGNOSIS — E291 Testicular hypofunction: Secondary | ICD-10-CM | POA: Diagnosis not present

## 2020-01-12 DIAGNOSIS — M109 Gout, unspecified: Secondary | ICD-10-CM | POA: Diagnosis not present

## 2020-01-12 DIAGNOSIS — E669 Obesity, unspecified: Secondary | ICD-10-CM | POA: Diagnosis not present

## 2020-01-12 DIAGNOSIS — J309 Allergic rhinitis, unspecified: Secondary | ICD-10-CM | POA: Diagnosis not present

## 2020-01-12 DIAGNOSIS — Z1331 Encounter for screening for depression: Secondary | ICD-10-CM | POA: Diagnosis not present

## 2020-01-12 DIAGNOSIS — N200 Calculus of kidney: Secondary | ICD-10-CM | POA: Diagnosis not present

## 2020-01-12 DIAGNOSIS — M069 Rheumatoid arthritis, unspecified: Secondary | ICD-10-CM | POA: Insufficient documentation

## 2020-01-12 DIAGNOSIS — D751 Secondary polycythemia: Secondary | ICD-10-CM | POA: Insufficient documentation

## 2020-01-12 DIAGNOSIS — K219 Gastro-esophageal reflux disease without esophagitis: Secondary | ICD-10-CM | POA: Diagnosis not present

## 2020-01-12 DIAGNOSIS — I129 Hypertensive chronic kidney disease with stage 1 through stage 4 chronic kidney disease, or unspecified chronic kidney disease: Secondary | ICD-10-CM | POA: Diagnosis not present

## 2020-01-12 DIAGNOSIS — N1832 Chronic kidney disease, stage 3b: Secondary | ICD-10-CM | POA: Insufficient documentation

## 2020-01-12 DIAGNOSIS — Z0000 Encounter for general adult medical examination without abnormal findings: Secondary | ICD-10-CM | POA: Diagnosis not present

## 2020-01-12 DIAGNOSIS — J45909 Unspecified asthma, uncomplicated: Secondary | ICD-10-CM | POA: Diagnosis not present

## 2020-01-12 DIAGNOSIS — H353220 Exudative age-related macular degeneration, left eye, stage unspecified: Secondary | ICD-10-CM | POA: Diagnosis not present

## 2020-01-12 DIAGNOSIS — E559 Vitamin D deficiency, unspecified: Secondary | ICD-10-CM | POA: Diagnosis not present

## 2020-01-13 DIAGNOSIS — Z23 Encounter for immunization: Secondary | ICD-10-CM | POA: Diagnosis not present

## 2020-01-16 DIAGNOSIS — M545 Low back pain: Secondary | ICD-10-CM | POA: Diagnosis not present

## 2020-01-16 DIAGNOSIS — M961 Postlaminectomy syndrome, not elsewhere classified: Secondary | ICD-10-CM | POA: Diagnosis not present

## 2020-01-16 DIAGNOSIS — Z79891 Long term (current) use of opiate analgesic: Secondary | ICD-10-CM | POA: Diagnosis not present

## 2020-01-16 DIAGNOSIS — M5136 Other intervertebral disc degeneration, lumbar region: Secondary | ICD-10-CM | POA: Diagnosis not present

## 2020-01-16 DIAGNOSIS — M5030 Other cervical disc degeneration, unspecified cervical region: Secondary | ICD-10-CM | POA: Diagnosis not present

## 2020-01-23 ENCOUNTER — Encounter: Payer: Self-pay | Admitting: Family

## 2020-01-23 ENCOUNTER — Inpatient Hospital Stay: Payer: Medicare Other

## 2020-01-23 ENCOUNTER — Telehealth: Payer: Self-pay | Admitting: Family

## 2020-01-23 ENCOUNTER — Other Ambulatory Visit: Payer: Self-pay

## 2020-01-23 ENCOUNTER — Inpatient Hospital Stay: Payer: Medicare Other | Attending: Family | Admitting: Family

## 2020-01-23 ENCOUNTER — Other Ambulatory Visit: Payer: Self-pay | Admitting: Family

## 2020-01-23 VITALS — BP 121/93 | HR 98 | Temp 97.7°F | Resp 18 | Ht 69.0 in | Wt 219.0 lb

## 2020-01-23 DIAGNOSIS — Z8042 Family history of malignant neoplasm of prostate: Secondary | ICD-10-CM | POA: Insufficient documentation

## 2020-01-23 DIAGNOSIS — Z86718 Personal history of other venous thrombosis and embolism: Secondary | ICD-10-CM | POA: Insufficient documentation

## 2020-01-23 DIAGNOSIS — R0602 Shortness of breath: Secondary | ICD-10-CM | POA: Insufficient documentation

## 2020-01-23 DIAGNOSIS — E349 Endocrine disorder, unspecified: Secondary | ICD-10-CM

## 2020-01-23 DIAGNOSIS — Z885 Allergy status to narcotic agent status: Secondary | ICD-10-CM | POA: Diagnosis not present

## 2020-01-23 DIAGNOSIS — J45901 Unspecified asthma with (acute) exacerbation: Secondary | ICD-10-CM | POA: Insufficient documentation

## 2020-01-23 DIAGNOSIS — R200 Anesthesia of skin: Secondary | ICD-10-CM | POA: Insufficient documentation

## 2020-01-23 DIAGNOSIS — R202 Paresthesia of skin: Secondary | ICD-10-CM | POA: Diagnosis not present

## 2020-01-23 DIAGNOSIS — D751 Secondary polycythemia: Secondary | ICD-10-CM | POA: Diagnosis not present

## 2020-01-23 DIAGNOSIS — M19012 Primary osteoarthritis, left shoulder: Secondary | ICD-10-CM | POA: Insufficient documentation

## 2020-01-23 DIAGNOSIS — R140 Abdominal distension (gaseous): Secondary | ICD-10-CM | POA: Insufficient documentation

## 2020-01-23 DIAGNOSIS — Z79899 Other long term (current) drug therapy: Secondary | ICD-10-CM | POA: Insufficient documentation

## 2020-01-23 DIAGNOSIS — Z823 Family history of stroke: Secondary | ICD-10-CM | POA: Insufficient documentation

## 2020-01-23 DIAGNOSIS — Z87442 Personal history of urinary calculi: Secondary | ICD-10-CM | POA: Diagnosis not present

## 2020-01-23 DIAGNOSIS — D582 Other hemoglobinopathies: Secondary | ICD-10-CM | POA: Diagnosis not present

## 2020-01-23 DIAGNOSIS — D45 Polycythemia vera: Secondary | ICD-10-CM

## 2020-01-23 DIAGNOSIS — R7989 Other specified abnormal findings of blood chemistry: Secondary | ICD-10-CM | POA: Diagnosis not present

## 2020-01-23 DIAGNOSIS — Z86711 Personal history of pulmonary embolism: Secondary | ICD-10-CM | POA: Diagnosis not present

## 2020-01-23 DIAGNOSIS — K5900 Constipation, unspecified: Secondary | ICD-10-CM | POA: Diagnosis not present

## 2020-01-23 DIAGNOSIS — Z8249 Family history of ischemic heart disease and other diseases of the circulatory system: Secondary | ICD-10-CM | POA: Diagnosis not present

## 2020-01-23 DIAGNOSIS — Z880 Allergy status to penicillin: Secondary | ICD-10-CM | POA: Diagnosis not present

## 2020-01-23 LAB — CMP (CANCER CENTER ONLY)
ALT: 12 U/L (ref 0–44)
AST: 14 U/L — ABNORMAL LOW (ref 15–41)
Albumin: 4.2 g/dL (ref 3.5–5.0)
Alkaline Phosphatase: 84 U/L (ref 38–126)
Anion gap: 9 (ref 5–15)
BUN: 24 mg/dL — ABNORMAL HIGH (ref 8–23)
CO2: 29 mmol/L (ref 22–32)
Calcium: 9.9 mg/dL (ref 8.9–10.3)
Chloride: 102 mmol/L (ref 98–111)
Creatinine: 1.88 mg/dL — ABNORMAL HIGH (ref 0.61–1.24)
GFR, Est AFR Am: 39 mL/min — ABNORMAL LOW (ref >60)
GFR, Estimated: 34 mL/min — ABNORMAL LOW (ref >60)
Glucose, Bld: 116 mg/dL — ABNORMAL HIGH (ref 70–99)
Potassium: 3.8 mmol/L (ref 3.5–5.1)
Sodium: 140 mmol/L (ref 135–145)
Total Bilirubin: 1.2 mg/dL (ref 0.3–1.2)
Total Protein: 6.7 g/dL (ref 6.5–8.1)

## 2020-01-23 LAB — CBC WITH DIFFERENTIAL (CANCER CENTER ONLY)
Abs Immature Granulocytes: 0.04 10*3/uL (ref 0.00–0.07)
Basophils Absolute: 0.1 10*3/uL (ref 0.0–0.1)
Basophils Relative: 1 %
Eosinophils Absolute: 0.1 10*3/uL (ref 0.0–0.5)
Eosinophils Relative: 2 %
HCT: 50.7 % (ref 39.0–52.0)
Hemoglobin: 16.6 g/dL (ref 13.0–17.0)
Immature Granulocytes: 1 %
Lymphocytes Relative: 33 %
Lymphs Abs: 2 10*3/uL (ref 0.7–4.0)
MCH: 30.2 pg (ref 26.0–34.0)
MCHC: 32.7 g/dL (ref 30.0–36.0)
MCV: 92.3 fL (ref 80.0–100.0)
Monocytes Absolute: 0.6 10*3/uL (ref 0.1–1.0)
Monocytes Relative: 9 %
Neutro Abs: 3.4 10*3/uL (ref 1.7–7.7)
Neutrophils Relative %: 54 %
Platelet Count: 221 10*3/uL (ref 150–400)
RBC: 5.49 MIL/uL (ref 4.22–5.81)
RDW: 13.5 % (ref 11.5–15.5)
WBC Count: 6.2 10*3/uL (ref 4.0–10.5)
nRBC: 0 % (ref 0.0–0.2)

## 2020-01-23 LAB — RETICULOCYTES
Immature Retic Fract: 10.1 % (ref 2.3–15.9)
RBC.: 5.42 MIL/uL (ref 4.22–5.81)
Retic Count, Absolute: 77 10*3/uL (ref 19.0–186.0)
Retic Ct Pct: 1.4 % (ref 0.4–3.1)

## 2020-01-23 LAB — IRON AND TIBC
Iron: 125 ug/dL (ref 42–163)
Saturation Ratios: 45 % (ref 20–55)
TIBC: 279 ug/dL (ref 202–409)
UIBC: 154 ug/dL (ref 117–376)

## 2020-01-23 LAB — FERRITIN: Ferritin: 199 ng/mL (ref 24–336)

## 2020-01-23 LAB — SAVE SMEAR(SSMR), FOR PROVIDER SLIDE REVIEW

## 2020-01-23 LAB — LACTATE DEHYDROGENASE: LDH: 162 U/L (ref 98–192)

## 2020-01-23 NOTE — Progress Notes (Signed)
Hematology/Oncology Consultation   Name: [name]      MRN: [id number]    Location: Room/bed info not found  Date: 01/23/2020 Time:9:33 AM   REFERRING PHYSICIAN: Edrick Oh, MD  REASON FOR CONSULT: Polycythemia    DIAGNOSIS: Secondary polycythemia - testosterone injections every 2 weeks  HISTORY OF PRESENT ILLNESS: [name] is a very pleasant 74 yo caucasian gentleman with erythrocytosis that appears to be associated with his testosterone injections. He has held his injection for 30 days and Hgb is down from 17.8 to 16.6.  He states that he is asymptomatic with this. No headaches, blurry or loss of vision, hot flashes, night sweats. No ruddy complexion.  No fever, chills, n/v, cough, rash, dizziness, chest pain, palpitations, abdominal pain or changes in bowel or bladder habits.  He has SOB secondary to exacerbation of asthma at times.  He does not smoke.  He only occasionally takes a baby aspirin.   He has bloating and gas with certain foods.  He takes Mirilax as needed for constipation.  He states that his last colonoscopy was in 5 years ago with Dr. Lyda Jester in [place]. He had 2 polyps removed and states that he is good for 10 years. He states that he has history of left DVT and PEs that developed after surgery on his left hand for arthritic nodules in [place]. He states that he was on injections while in the hospital and then went home on a pill for 30 days. He can not remember which medications these were.  No other history of thrombus.  No issues with bleeding. No bruising or petechiae.  No personal history of cancer. His father had both eye and prostate cancer.  He states that Dr. Diona Fanti checks his PSA annually.  His mother had history of heart attack and stroke.  No liver issues that he is aware of.  No swelling or tenderness in his extremities.  He has arthritis in his neck, shoulders and hands. He occasionally has numbness and tingling in the left hand  secondary to arthritis in the left shoulder. He goes for injections periodically.  No falls or syncopal episodes to report.  He has maintained a good appetite and is staying well hydrated. His weight is stable.   ROS: All other 10 point review of systems is negative.   PAST MEDICAL HISTORY:   Past Medical History:  Diagnosis Date  . Arthritis   . Asthma   . BPH (benign prostatic hyperplasia)   . Complication of anesthesia    PROBLEMS VOIDING AFTER HERNIA REPAIR  . GERD (gastroesophageal reflux disease)   . Gout   . Hyperlipidemia   . Hypertension   . IBS (irritable bowel syndrome)   . Macular degeneration   . Pulmonary embolus ([place]) 09/06/12   HOSP AT [facility] AND PLACED ON COUMADIN  . Rheumatoid arthritis ([place])   . Seasonal allergies   . Urinary retention    HX OF KIDNEY STONES-BILATERAL, HX BPH  -- PT TOLD HIS KIDNEY FUNCTIONS  ELVATED WHILE HOSP DEC 2013 FOR PULMONARY EMBOLUS    ALLERGIES: Allergies  Allergen Reactions  . Caffeine Other (See Comments)    Urinary retention and jittery  . Codeine Other (See Comments)    Jittery  . Iodinated Diagnostic Agents Other (See Comments)    Hard on kidneys. The blue dye that went through my IV.   Marland Kitchen Pseudoephedrine Other (See Comments)    Urinary retention  . Simvastatin Other (See Comments)  Hurt my bones  . Penicillins Itching      MEDICATIONS:  Current Outpatient Medications on File Prior to Visit  Medication Sig Dispense Refill  . albuterol (PROAIR HFA) 108 (90 Base) MCG/ACT inhaler Inhale two puffs every 4-6 hours if needed for cough or wheeze. 3 Inhaler 0  . allopurinol (ZYLOPRIM) 100 MG tablet Take 100 mg by mouth 2 (two) times daily.     Marland Kitchen amLODipine (NORVASC) 2.5 MG tablet Take 2.5 mg by mouth daily.    Marland Kitchen azelastine (ASTELIN) 0.1 % nasal spray Use two sprays in each nostril twice daily as directed for nasal drainage. 30 mL 5  . doxazosin (CARDURA) 2 MG tablet Take 2 mg by mouth every morning.    .  fluticasone (FLONASE) 50 MCG/ACT nasal spray Place 1 spray into both nostrils daily.     . furosemide (LASIX) 40 MG tablet Take 40 mg by mouth daily.  3  . HYDROcodone-acetaminophen (NORCO) 10-325 MG tablet Take 1 tablet by mouth 3 (three) times daily as needed.  0  . levocetirizine (XYZAL) 5 MG tablet Take 5 mg by mouth daily as needed. For allergies    . omeprazole (PRILOSEC) 20 MG capsule Take 20 mg by mouth 2 (two) times daily.    . potassium citrate (UROCIT-K) 10 MEQ (1080 MG) SR tablet Take 10 mEq by mouth 2 (two) times daily.    . Testosterone Cypionate 200 MG/ML SOLN testosterone cypionate 200 mg/mL intramuscular oil  inject 0.5 milliliter intramuscularly every week     No current facility-administered medications on file prior to visit.     PAST SURGICAL HISTORY Past Surgical History:  Procedure Laterality Date  . ADENOIDECTOMY    . APPENDECTOMY    . CHOLECYSTECTOMY    . HERNIA REPAIR  2011   umb  . LUMBAR LAMINECTOMY    . RIGHT HAND SURGERY 06/30/12    . TONSILLECTOMY    . TOTAL KNEE ARTHROPLASTY Right 06/17/2013   Procedure: RIGHT TOTAL KNEE ARTHROPLASTY;  Surgeon: Gearlean Alf, MD;  Location: [facility];  Service: Orthopedics;  Laterality: Right;    FAMILY HISTORY: Family History  Problem Relation Age of Onset  . Stroke Mother   . Heart attack Mother   . High blood pressure Mother   . Prostate cancer Father     SOCIAL HISTORY:  reports that he has never smoked. He has never used smokeless tobacco. He reports that he does not drink alcohol or use drugs.  PERFORMANCE STATUS: The patient's performance status is 0 - Asymptomatic  PHYSICAL EXAM: Most Recent Vital Signs: Blood pressure (!) 121/93, pulse 98, temperature 97.7 F (36.5 C), temperature source Temporal, resp. rate 18, height '5\' 9"'  (1.753 m), weight 219 lb (99.3 kg), SpO2 99 %. BP (!) 121/93 (BP Location: Left Arm, Patient Position: Sitting)   Pulse 98   Temp 97.7 F (36.5 C) (Temporal)   Resp 18   Ht  '5\' 9"'  (1.753 m)   Wt 219 lb (99.3 kg)   SpO2 99%   BMI 32.34 kg/m   General Appearance:    Alert, cooperative, no distress, appears stated age  Head:    Normocephalic, without obvious abnormality, atraumatic  Eyes:    PERRL, conjunctiva/corneas clear, EOM's intact, fundi    benign, both eyes             Throat:   Lips, mucosa, and tongue normal; teeth and gums normal  Neck:   Supple, symmetrical, trachea midline, no adenopathy;  thyroid:  No enlargement/tenderness/nodules; no carotid   bruit or JVD  Back:     Symmetric, no curvature, ROM normal, no CVA tenderness  Lungs:     Clear to auscultation bilaterally, respirations unlabored  Chest wall:    No tenderness or deformity  Heart:    Regular rate and rhythm, S1 and S2 normal, no murmur, rub   or gallop  Abdomen:     Soft, non-tender, bowel sounds active all four quadrants,    no masses, no organomegaly        Extremities:   Extremities normal, atraumatic, no cyanosis or edema  Pulses:   2+ and symmetric all extremities  Skin:   Skin color, texture, turgor normal, no rashes or lesions  Lymph nodes:   Cervical, supraclavicular, and axillary nodes normal  Neurologic:   CNII-XII intact. Normal strength, sensation and reflexes      throughout    LABORATORY DATA:  Results for orders placed or performed in visit on 01/23/20 (from the past 48 hour(s))  Save Smear (SSMR)     Status: None   Collection Time: 01/23/20  8:50 AM  Result Value Ref Range   Smear Review SMEAR STAINED AND AVAILABLE FOR REVIEW     Comment: Performed at [facility] at [facility], [address], [place], [place] [address]  Reticulocytes     Status: None   Collection Time: 01/23/20  8:50 AM  Result Value Ref Range   Retic Ct Pct 1.4 0.4 - 3.1 %   RBC. 5.42 4.22 - 5.81 MIL/uL   Retic Count, Absolute 77.0 19.0 - 186.0 K/uL   Immature Retic Fract 10.1 2.3 - 15.9 %    Comment: Performed at [facility] at  [facility], [address] [place], [place] [address]  CMP ([place] only)     Status: Abnormal   Collection Time: 01/23/20  8:50 AM  Result Value Ref Range   Sodium 140 135 - 145 mmol/L   Potassium 3.8 3.5 - 5.1 mmol/L   Chloride 102 98 - 111 mmol/L   CO2 29 22 - 32 mmol/L   Glucose, Bld 116 (H) 70 - 99 mg/dL    Comment: Glucose reference range applies only to samples taken after fasting for at least 8 hours.   BUN 24 (H) 8 - 23 mg/dL   Creatinine 1.88 (H) 0.61 - 1.24 mg/dL   Calcium 9.9 8.9 - 10.3 mg/dL   Total Protein 6.7 6.5 - 8.1 g/dL   Albumin 4.2 3.5 - 5.0 g/dL   AST 14 (L) 15 - 41 U/L   ALT 12 0 - 44 U/L   Alkaline Phosphatase 84 38 - 126 U/L   Total Bilirubin 1.2 0.3 - 1.2 mg/dL   GFR, Est Non Af Am 34 (L) >60 mL/min   GFR, Est AFR Am 39 (L) >60 mL/min   Anion gap 9 5 - 15    Comment: Performed at [facility] Lab at [facility], [address], [place], [place] [address]  CBC with Differential (Cancer Center Only)     Status: None   Collection Time: 01/23/20  8:50 AM  Result Value Ref Range   WBC Count 6.2 4.0 - 10.5 K/uL   RBC 5.49 4.22 - 5.81 MIL/uL   Hemoglobin 16.6 13.0 - 17.0 g/dL   HCT 50.7 39.0 - 52.0 %   MCV 92.3 80.0 - 100.0 fL  MCH 30.2 26.0 - 34.0 pg   MCHC 32.7 30.0 - 36.0 g/dL   RDW 13.5 11.5 - 15.5 %   Platelet Count 221 150 - 400 K/uL   nRBC 0.0 0.0 - 0.2 %   Neutrophils Relative % 54 %   Neutro Abs 3.4 1.7 - 7.7 K/uL   Lymphocytes Relative 33 %   Lymphs Abs 2.0 0.7 - 4.0 K/uL   Monocytes Relative 9 %   Monocytes Absolute 0.6 0.1 - 1.0 K/uL   Eosinophils Relative 2 %   Eosinophils Absolute 0.1 0.0 - 0.5 K/uL   Basophils Relative 1 %   Basophils Absolute 0.1 0.0 - 0.1 K/uL   Immature Granulocytes 1 %   Abs Immature Granulocytes 0.04 0.00 - 0.07 K/uL    Comment: Performed at [facility] at [facility], [address], [place], [place] [address]      RADIOGRAPHY: No results  found.     PATHOLOGY: None  ASSESSMENT/PLAN: Mr. Wiltsey is a very pleasant 74 yo caucasian gentleman with erythrocytosis that appears to be associated with his testosterone injections.  He is doing well and is asymptomatic at this time.  He states that he truly feels better on the testosterone injections.  We suggested that he donate with [facility] every 2-3 months and this will help keep his Hgb down.  He states that he will call and make an appointment.  We will plan to see him again in 5 months.   All questions were answered and he is in agreement. He will contact our office with any questions or concerns. We can certainly see him sooner if needed.   He was discussed with and also seen by Dr. Marin Olp and he is in agreement with the aforementioned.   Laverna Peace, NP    ADDENDUM: I saw and examined Mr. Reesman with Judson Roch.  I agree with the above assessment.  I think that he has erythrocytosis secondary to testosterone injections.  I do not see anything that suggests a myeloproliferative neoplasm with bone marrow.  I did look at his blood smear.  I see nothing on his blood smear that looks like a myeloproliferative disorder.  He had no nucleated red blood cells.  He had no immature myeloid cells.  There were no hypersegmented polys.  There was no rouleaux formation.  I told him that the best way to help with this is to donate blood.  He would be a great donator to the [employer].  His blood type is O+.  Again, I do not see that there is a bone marrow issue here.  I do not see that he needs a bone marrow biopsy at all.  Of note, his iron studies show ferritin of 199 with an iron saturation of 45%.  His LDH is 162.  I do not see that we need any radiographic studies on him.  I would like to see him back in about 6 months.  At that time, I am sure we will see that his blood count should be doing okay.  It is apparent that the supplemental testosterone makes him feel better.   This is helpful for his quality of life.  As such, he cannot stop the supplemental testosterone injections.  Again, I think that Mr. Binstock has a erythrocytosis secondary to testosterone supplementation.  The treatment is going to be remedied to donate blood to the [employer] since he has a very necessary blood type  for donations.  We spent 45 minutes with him today.  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  He is a true [employer] hero.   Lattie Haw, MD

## 2020-01-23 NOTE — Telephone Encounter (Signed)
Appointments scheduled calendar printed & mailed per 4/19 los

## 2020-01-26 DIAGNOSIS — I129 Hypertensive chronic kidney disease with stage 1 through stage 4 chronic kidney disease, or unspecified chronic kidney disease: Secondary | ICD-10-CM | POA: Diagnosis not present

## 2020-01-26 DIAGNOSIS — D751 Secondary polycythemia: Secondary | ICD-10-CM | POA: Diagnosis not present

## 2020-01-26 DIAGNOSIS — N1832 Chronic kidney disease, stage 3b: Secondary | ICD-10-CM | POA: Diagnosis not present

## 2020-01-26 DIAGNOSIS — M5380 Other specified dorsopathies, site unspecified: Secondary | ICD-10-CM | POA: Diagnosis not present

## 2020-01-26 DIAGNOSIS — E669 Obesity, unspecified: Secondary | ICD-10-CM | POA: Diagnosis not present

## 2020-01-30 ENCOUNTER — Telehealth: Payer: Self-pay | Admitting: Family

## 2020-01-30 NOTE — Telephone Encounter (Signed)
patient did not answer his cell or home phone. No voicemail set up. Left massage on wifes cell phone with call back number.

## 2020-01-31 DIAGNOSIS — M5030 Other cervical disc degeneration, unspecified cervical region: Secondary | ICD-10-CM | POA: Diagnosis not present

## 2020-02-06 ENCOUNTER — Telehealth: Payer: Self-pay | Admitting: Family

## 2020-02-06 NOTE — Telephone Encounter (Signed)
I was able to speak with [name] and go over his recent lab work in detail. We will have him donate twice with the [facility] every 56 days and then see him back in 4 months to recheck lab work. He is in agreement with the plan and will contact the [facility] to set up his appointment. No questions or concerns at this time.

## 2020-02-10 ENCOUNTER — Other Ambulatory Visit: Payer: Self-pay | Admitting: Family

## 2020-02-10 LAB — JAK 2 V617F (GENPATH)

## 2020-02-13 ENCOUNTER — Other Ambulatory Visit: Payer: Self-pay

## 2020-02-13 ENCOUNTER — Encounter (INDEPENDENT_AMBULATORY_CARE_PROVIDER_SITE_OTHER): Payer: Medicare Other | Admitting: Ophthalmology

## 2020-02-13 DIAGNOSIS — D3132 Benign neoplasm of left choroid: Secondary | ICD-10-CM | POA: Diagnosis not present

## 2020-02-13 DIAGNOSIS — H43813 Vitreous degeneration, bilateral: Secondary | ICD-10-CM | POA: Diagnosis not present

## 2020-02-13 DIAGNOSIS — H35033 Hypertensive retinopathy, bilateral: Secondary | ICD-10-CM | POA: Diagnosis not present

## 2020-02-13 DIAGNOSIS — H353112 Nonexudative age-related macular degeneration, right eye, intermediate dry stage: Secondary | ICD-10-CM

## 2020-02-13 DIAGNOSIS — I10 Essential (primary) hypertension: Secondary | ICD-10-CM | POA: Diagnosis not present

## 2020-02-13 DIAGNOSIS — H353221 Exudative age-related macular degeneration, left eye, with active choroidal neovascularization: Secondary | ICD-10-CM

## 2020-02-17 ENCOUNTER — Telehealth: Payer: Self-pay | Admitting: *Deleted

## 2020-02-17 NOTE — Telephone Encounter (Signed)
This nurse tried calling patient regarding phlebotomies at [facility]. I faxed orders to [facility] for him to have phlebotomies as needed. Patient doesn't need to call and doesn't have to have an appointment. [facility] is located at [address] [place], [place] [address]. Phone number is [phone]. I was unable to leave a message with patient. I will try calling him on Monday.

## 2020-02-20 ENCOUNTER — Telehealth: Payer: Self-pay | Admitting: *Deleted

## 2020-02-20 NOTE — Telephone Encounter (Signed)
Patient notified that orders have been faxed to [facility] to donate blood twice, 56 days apart per order of S. Hardwick NP.  Pt states that [facility] contacted him on Saturday, 02/18/20 and that he is going to donate this Wednesday.  Pt appreciative of call and September appts reviewed with him.  Pt has no questions or concerns at this time.

## 2020-03-01 DIAGNOSIS — L720 Epidermal cyst: Secondary | ICD-10-CM | POA: Diagnosis not present

## 2020-03-01 DIAGNOSIS — L821 Other seborrheic keratosis: Secondary | ICD-10-CM | POA: Diagnosis not present

## 2020-03-01 DIAGNOSIS — L578 Other skin changes due to chronic exposure to nonionizing radiation: Secondary | ICD-10-CM | POA: Diagnosis not present

## 2020-03-01 DIAGNOSIS — L820 Inflamed seborrheic keratosis: Secondary | ICD-10-CM | POA: Diagnosis not present

## 2020-03-01 DIAGNOSIS — L570 Actinic keratosis: Secondary | ICD-10-CM | POA: Diagnosis not present

## 2020-03-09 DIAGNOSIS — E291 Testicular hypofunction: Secondary | ICD-10-CM | POA: Diagnosis not present

## 2020-03-09 DIAGNOSIS — R948 Abnormal results of function studies of other organs and systems: Secondary | ICD-10-CM | POA: Diagnosis not present

## 2020-03-16 DIAGNOSIS — N401 Enlarged prostate with lower urinary tract symptoms: Secondary | ICD-10-CM | POA: Diagnosis not present

## 2020-03-16 DIAGNOSIS — R351 Nocturia: Secondary | ICD-10-CM | POA: Diagnosis not present

## 2020-03-16 DIAGNOSIS — N5201 Erectile dysfunction due to arterial insufficiency: Secondary | ICD-10-CM | POA: Diagnosis not present

## 2020-03-16 DIAGNOSIS — E291 Testicular hypofunction: Secondary | ICD-10-CM | POA: Diagnosis not present

## 2020-03-19 ENCOUNTER — Encounter (INDEPENDENT_AMBULATORY_CARE_PROVIDER_SITE_OTHER): Payer: Medicare Other | Admitting: Ophthalmology

## 2020-03-19 ENCOUNTER — Other Ambulatory Visit: Payer: Self-pay

## 2020-03-19 DIAGNOSIS — D3132 Benign neoplasm of left choroid: Secondary | ICD-10-CM | POA: Diagnosis not present

## 2020-03-19 DIAGNOSIS — H353221 Exudative age-related macular degeneration, left eye, with active choroidal neovascularization: Secondary | ICD-10-CM

## 2020-03-19 DIAGNOSIS — H43813 Vitreous degeneration, bilateral: Secondary | ICD-10-CM

## 2020-03-19 DIAGNOSIS — H2513 Age-related nuclear cataract, bilateral: Secondary | ICD-10-CM

## 2020-03-19 DIAGNOSIS — H353111 Nonexudative age-related macular degeneration, right eye, early dry stage: Secondary | ICD-10-CM | POA: Diagnosis not present

## 2020-03-27 ENCOUNTER — Encounter: Payer: Self-pay | Admitting: Family

## 2020-04-17 ENCOUNTER — Other Ambulatory Visit: Payer: Self-pay

## 2020-04-17 ENCOUNTER — Ambulatory Visit (INDEPENDENT_AMBULATORY_CARE_PROVIDER_SITE_OTHER): Payer: Medicare Other | Admitting: Allergy

## 2020-04-17 ENCOUNTER — Encounter: Payer: Self-pay | Admitting: Allergy

## 2020-04-17 VITALS — BP 122/72 | HR 72 | Resp 14 | Ht 70.4 in | Wt 220.0 lb

## 2020-04-17 DIAGNOSIS — J310 Chronic rhinitis: Secondary | ICD-10-CM | POA: Diagnosis not present

## 2020-04-17 DIAGNOSIS — H9201 Otalgia, right ear: Secondary | ICD-10-CM | POA: Diagnosis not present

## 2020-04-17 DIAGNOSIS — R062 Wheezing: Secondary | ICD-10-CM | POA: Diagnosis not present

## 2020-04-17 MED ORDER — LEVOCETIRIZINE DIHYDROCHLORIDE 5 MG PO TABS: ORAL_TABLET | ORAL | 5 refills | Status: DC

## 2020-04-17 MED ORDER — AZELASTINE HCL 0.1 % NA SOLN: NASAL | 5 refills | Status: DC

## 2020-04-17 MED ORDER — FLUTICASONE PROPIONATE 50 MCG/ACT NA SUSP: NASAL | 5 refills | Status: DC

## 2020-04-17 MED ORDER — ALBUTEROL SULFATE HFA 108 (90 BASE) MCG/ACT IN AERS: INHALATION_SPRAY | RESPIRATORY_TRACT | 1 refills | Status: DC

## 2020-04-17 NOTE — Patient Instructions (Addendum)
Rhinitis, non-allergic    - environmental allergy panel obtained in 2019 was negative       - continue xyzal 5mg  daily at this time    - use flonase if you are having nasal congestion.  Use 2 sprays each nostril daily for 1-2 weeks at a time before stopping once improved.  If you are not having any congestion then hold flonase    - for nasal drainage/runny nose recommend use of nasal antihistamine, Astelin, 2 sprays each nostril twice a day   Wheezing   - have access to albuterol inhaler 2 puffs every 4-6 hours as needed for cough/wheeze/shortness of breath/chest tightness.  May use 15-20 minutes prior to activity.   Monitor frequency of use.     - use inhaler with spacer chamber   Asthma control goals:   Full participation in all desired activities (may need albuterol before activity)  Albuterol use two time or less a week on average (not counting use with activity)  Cough interfering with sleep two time or less a month  Oral steroids no more than once a year  No hospitalizations  Ear pain, right   - likely due to cerumen against the TM.  Recommend gentle water flushes with bulb syringe to help moisten/loosing cerumen.  Let us know if you have any sinus infections or infections in general after starting back on infusions.  Follow-up  4-6 months or sooner if needed

## 2020-04-17 NOTE — Progress Notes (Signed)
Follow-up Note  RE: [name] MRN: [id number] DOB: Oct 24, 1945 Date of Office Visit: 04/17/2020   History of present illness: [name] is a 74 y.o. male presenting today for follow-up of nonallergic rhinitis, wheezing and history of sinusitis.  He was last seen in the office on 11/15/2019 by myself.  He states he has been doing well since last visit without any major health changes since last visit.  He states he is going to start back on infusions next week and believes it is remicade he has had in the past.  previously with remicade he was developing a significant amount of infections including sinus infections.  He states his arthritis however has worsened that he wants to retry the infusions. Otherwise he states the Astelin spray is very helpful in controlling his drainage as well as xyzal.  The flonase also helps with congestion.   He does states he has been having some right ear pain that comes and goes that he feels in the canal.  Denies tinnitus. He believes he may have had one sinus infection since last visit with antibiotics but isn't entirely sure.  He states the spacer device has been very helpful in him using his albuterol as he states he can tell he gets more of the medication with the device.  Currently using albuterol about 2-3 times a week on average and states the heat/humidity is a big trigger.  Otherwise denies any nighttime awakenings.  No ED/UC visits or steroid needs.  He has had both Moderna vaccine doses.     Review of systems: Review of Systems  Constitutional: Negative.   HENT: Positive for ear pain.   Eyes: Negative.   Respiratory: Positive for wheezing.   Cardiovascular: Negative.   Gastrointestinal: Negative.   Musculoskeletal: Negative.   Skin: Negative.   Neurological: Negative.     All other systems negative unless noted above in HPI  Past medical/social/surgical/family history have been reviewed and are unchanged unless specifically  indicated below.  No changes  Medication List: Current Outpatient Medications  Medication Sig Dispense Refill  . albuterol (PROAIR HFA) 108 (90 Base) MCG/ACT inhaler Inhale two puffs every 4-6 hours if needed for cough or wheeze. 3 Inhaler 0  . allopurinol (ZYLOPRIM) 100 MG tablet Take 100 mg by mouth 2 (two) times daily.     Marland Kitchen amLODipine (NORVASC) 2.5 MG tablet Take 2.5 mg by mouth daily.    Marland Kitchen azelastine (ASTELIN) 0.1 % nasal spray Use two sprays in each nostril twice daily as directed for nasal drainage. 30 mL 5  . Cholecalciferol (VITAMIN D3 PO) Take by mouth daily.    Marland Kitchen doxazosin (CARDURA) 2 MG tablet Take 2 mg by mouth every morning.    . fluticasone (FLONASE) 50 MCG/ACT nasal spray Place 1 spray into both nostrils daily.     . furosemide (LASIX) 40 MG tablet Take 40 mg by mouth daily.  3  . HYDROcodone-acetaminophen (NORCO) 10-325 MG tablet Take 1 tablet by mouth 3 (three) times daily as needed.  0  . levocetirizine (XYZAL) 5 MG tablet Take 5 mg by mouth daily as needed. For allergies    . methocarbamol (ROBAXIN) 500 MG tablet Take 500 mg by mouth 3 (three) times daily as needed.    Marland Kitchen omeprazole (PRILOSEC) 20 MG capsule Take 20 mg by mouth 2 (two) times daily.    . potassium citrate (UROCIT-K) 10 MEQ (1080 MG) SR tablet Take 10 mEq by mouth 2 (two) times daily.    Marland Kitchen  Testosterone Cypionate 200 MG/ML SOLN testosterone cypionate 200 mg/mL intramuscular oil  inject 0.5 milliliter intramuscularly every week     No current facility-administered medications for this visit.     Known medication allergies: Allergies  Allergen Reactions  . Caffeine Other (See Comments)    Urinary retention and jittery  . Codeine Other (See Comments)    Jittery  . Iodinated Diagnostic Agents Other (See Comments)    Hard on kidneys. The blue dye that went through my IV.   Marland Kitchen Pseudoephedrine Other (See Comments)    Urinary retention  . Simvastatin Other (See Comments)    Hurt my bones  . Penicillins  Itching     Physical examination: Blood pressure 122/72, pulse 72, resp. rate 14, height 5' 10.4" (1.788 m), weight 220 lb (99.8 kg), SpO2 96 %.  General: Alert, interactive, in no acute distress. HEENT: PERRLA, TMs pearly gray, Rt canal with small amount of dried cerumen near TM,  turbinates minimally edematous without discharge, post-pharynx non erythematous. Neck: Supple without lymphadenopathy. Lungs: Clear to auscultation without wheezing, rhonchi or rales. {no increased work of breathing. CV: Normal S1, S2 without murmurs. Abdomen: Nondistended, nontender. Skin: Warm and dry, without lesions or rashes. Extremities:  No clubbing, cyanosis or edema. Neuro:   Grossly intact.  Diagnositics/Labs:  Spirometry: FEV1: 1.65L 54%, FVC: 2.64L 63% predicted.  Rather stable from previous study.   Assessment and plan:   Rhinitis, non-allergic    - environmental allergy panel obtained in 2019 was negative       - continue xyzal 5mg  daily at this time    - use flonase if you are having nasal congestion.  Use 2 sprays each nostril daily for 1-2 weeks at a time before stopping once improved.  If you are not having any congestion then hold flonase    - for nasal drainage/runny nose recommend use of nasal antihistamine, Astelin, 2 sprays each nostril twice a day   Wheezing   - have access to albuterol inhaler 2 puffs every 4-6 hours as needed for cough/wheeze/shortness of breath/chest tightness.  May use 15-20 minutes prior to activity.   Monitor frequency of use.     - use inhaler with spacer chamber   Asthma control goals:   Full participation in all desired activities (may need albuterol before activity)  Albuterol use two time or less a week on average (not counting use with activity)  Cough interfering with sleep two time or less a month  Oral steroids no more than once a year  No hospitalizations  Ear pain, right   - likely due to cerumen against the TM.  Recommend water flushes  with bulb syringe to help moisten/loosing cerumen.  Let us know if you have any sinus infections or infections in general after starting back on infusions.  Follow-up  4-6 months or sooner if needed  I appreciate the opportunity to take part in Daily's care. Please do not hesitate to contact me with questions.  Sincerely,   Prudy Feeler, MD Allergy/Immunology [facility]

## 2020-04-23 ENCOUNTER — Encounter (INDEPENDENT_AMBULATORY_CARE_PROVIDER_SITE_OTHER): Payer: Medicare Other | Admitting: Ophthalmology

## 2020-04-23 ENCOUNTER — Other Ambulatory Visit: Payer: Self-pay

## 2020-04-23 DIAGNOSIS — H353111 Nonexudative age-related macular degeneration, right eye, early dry stage: Secondary | ICD-10-CM | POA: Diagnosis not present

## 2020-04-23 DIAGNOSIS — D3132 Benign neoplasm of left choroid: Secondary | ICD-10-CM

## 2020-04-23 DIAGNOSIS — H2513 Age-related nuclear cataract, bilateral: Secondary | ICD-10-CM | POA: Diagnosis not present

## 2020-04-23 DIAGNOSIS — I10 Essential (primary) hypertension: Secondary | ICD-10-CM

## 2020-04-23 DIAGNOSIS — H35033 Hypertensive retinopathy, bilateral: Secondary | ICD-10-CM

## 2020-04-23 DIAGNOSIS — H353221 Exudative age-related macular degeneration, left eye, with active choroidal neovascularization: Secondary | ICD-10-CM

## 2020-04-23 DIAGNOSIS — H43813 Vitreous degeneration, bilateral: Secondary | ICD-10-CM | POA: Diagnosis not present

## 2020-04-25 DIAGNOSIS — M0579 Rheumatoid arthritis with rheumatoid factor of multiple sites without organ or systems involvement: Secondary | ICD-10-CM | POA: Diagnosis not present

## 2020-05-04 DIAGNOSIS — M069 Rheumatoid arthritis, unspecified: Secondary | ICD-10-CM | POA: Diagnosis not present

## 2020-05-04 DIAGNOSIS — I129 Hypertensive chronic kidney disease with stage 1 through stage 4 chronic kidney disease, or unspecified chronic kidney disease: Secondary | ICD-10-CM | POA: Diagnosis not present

## 2020-05-04 DIAGNOSIS — M1380 Other specified arthritis, unspecified site: Secondary | ICD-10-CM | POA: Diagnosis not present

## 2020-05-04 DIAGNOSIS — N1832 Chronic kidney disease, stage 3b: Secondary | ICD-10-CM | POA: Diagnosis not present

## 2020-05-14 DIAGNOSIS — J309 Allergic rhinitis, unspecified: Secondary | ICD-10-CM | POA: Diagnosis not present

## 2020-05-14 DIAGNOSIS — J329 Chronic sinusitis, unspecified: Secondary | ICD-10-CM | POA: Diagnosis not present

## 2020-05-14 DIAGNOSIS — M069 Rheumatoid arthritis, unspecified: Secondary | ICD-10-CM | POA: Diagnosis not present

## 2020-05-16 DIAGNOSIS — E669 Obesity, unspecified: Secondary | ICD-10-CM | POA: Diagnosis not present

## 2020-05-16 DIAGNOSIS — M1A09X1 Idiopathic chronic gout, multiple sites, with tophus (tophi): Secondary | ICD-10-CM | POA: Diagnosis not present

## 2020-05-16 DIAGNOSIS — N1832 Chronic kidney disease, stage 3b: Secondary | ICD-10-CM | POA: Diagnosis not present

## 2020-05-16 DIAGNOSIS — Z6830 Body mass index (BMI) 30.0-30.9, adult: Secondary | ICD-10-CM | POA: Diagnosis not present

## 2020-05-16 DIAGNOSIS — M150 Primary generalized (osteo)arthritis: Secondary | ICD-10-CM | POA: Diagnosis not present

## 2020-05-16 DIAGNOSIS — M5136 Other intervertebral disc degeneration, lumbar region: Secondary | ICD-10-CM | POA: Diagnosis not present

## 2020-05-16 DIAGNOSIS — M0579 Rheumatoid arthritis with rheumatoid factor of multiple sites without organ or systems involvement: Secondary | ICD-10-CM | POA: Diagnosis not present

## 2020-05-21 ENCOUNTER — Other Ambulatory Visit: Payer: Self-pay

## 2020-05-21 ENCOUNTER — Encounter (INDEPENDENT_AMBULATORY_CARE_PROVIDER_SITE_OTHER): Payer: Medicare Other | Admitting: Ophthalmology

## 2020-05-21 DIAGNOSIS — H35033 Hypertensive retinopathy, bilateral: Secondary | ICD-10-CM | POA: Diagnosis not present

## 2020-05-21 DIAGNOSIS — D3132 Benign neoplasm of left choroid: Secondary | ICD-10-CM | POA: Diagnosis not present

## 2020-05-21 DIAGNOSIS — H43813 Vitreous degeneration, bilateral: Secondary | ICD-10-CM

## 2020-05-21 DIAGNOSIS — I10 Essential (primary) hypertension: Secondary | ICD-10-CM | POA: Diagnosis not present

## 2020-05-21 DIAGNOSIS — H353112 Nonexudative age-related macular degeneration, right eye, intermediate dry stage: Secondary | ICD-10-CM

## 2020-05-21 DIAGNOSIS — H353221 Exudative age-related macular degeneration, left eye, with active choroidal neovascularization: Secondary | ICD-10-CM

## 2020-05-22 ENCOUNTER — Ambulatory Visit (INDEPENDENT_AMBULATORY_CARE_PROVIDER_SITE_OTHER): Payer: Medicare Other | Admitting: Family

## 2020-05-22 ENCOUNTER — Encounter: Payer: Self-pay | Admitting: Family

## 2020-05-22 VITALS — BP 130/72 | HR 76 | Resp 18

## 2020-05-22 DIAGNOSIS — H9213 Otorrhea, bilateral: Secondary | ICD-10-CM

## 2020-05-22 DIAGNOSIS — R062 Wheezing: Secondary | ICD-10-CM

## 2020-05-22 DIAGNOSIS — J018 Other acute sinusitis: Secondary | ICD-10-CM

## 2020-05-22 DIAGNOSIS — J310 Chronic rhinitis: Secondary | ICD-10-CM

## 2020-05-22 MED ORDER — FLUTICASONE PROPIONATE 50 MCG/ACT NA SUSP: NASAL | 5 refills | Status: DC

## 2020-05-22 MED ORDER — DOXYCYCLINE MONOHYDRATE 100 MG PO TABS: 100.0000 mg | ORAL_TABLET | Freq: Two times a day (BID) | ORAL | 0 refills | Status: AC

## 2020-05-22 MED ORDER — DOXYCYCLINE MONOHYDRATE 100 MG PO TABS: 100.0000 mg | ORAL_TABLET | Freq: Two times a day (BID) | ORAL | 0 refills | Status: DC

## 2020-05-22 NOTE — Addendum Note (Signed)
Addended by: Zandra Abts on: 05/22/2020 03:08 PM   Modules accepted: Orders

## 2020-05-22 NOTE — Patient Instructions (Addendum)
Sinus Infection We are going to add more days of doxycyline 100 mg twice a day for 10 days  Rhinitis, non-allergic    - environmental allergy panel obtained in 2019 was negative       - continue xyzal 5mg  daily at this time    - use flonase if you are having nasal congestion.  Use 2 sprays each nostril daily for 1-2 weeks at a time before stopping once improved.  If you are not having any congestion then hold flonase    - for nasal drainage/runny nose recommend use of nasal antihistamine, Astelin, 2 sprays each nostril twice a day   Wheezing   - have access to albuterol inhaler 2 puffs every 4-6 hours as needed for cough/wheeze/shortness of breath/chest tightness.  May use 15-20 minutes prior to activity.   Monitor frequency of use.     - use inhaler with spacer chamber   Asthma control goals:   Full participation in all desired activities (may need albuterol before activity)  Albuterol use two time or less a week on average (not counting use with activity)  Cough interfering with sleep two time or less a month  Oral steroids no more than once a year  No hospitalizations  Ear drainage Consider referral to ENT due to bilateral ear drainage.  Follow-up 3-4 months or sooner if needed

## 2020-05-22 NOTE — Progress Notes (Signed)
[address] [place] [address] Dept: [phone]  FOLLOW UP NOTE  Patient ID: [name], male    DOB: [birth year]  Age: 74 y.o. MRN: [id number] Date of Office Visit: 05/22/2020  Assessment  Chief Complaint: Sinus Problem  HPI [name] is a 74 year old male who presents today for an acute visit.  He was last seen on April 17, 2020 by Dr. Nelva Bush for nonallergic rhinitis, wheezing, and ear pain.  He reports that he started back his Remicade in July and since then has had 2 sinus infections.  Both of the sinus infections have been treated by his family doctor.  He reports the first antibiotic he was given was possibly Augmentin along with some prednisone.  He reports that he did not have any problems with the Augmentin even though penicillin is listed as an allergy.  Then, 2 weeks later he was given 10 days of doxycycline and some more steroids.  He still has 2 days left of the doxycycline.  He feels that he his symptoms are approximately 50% better but just not cleared up yet.  He complains of sinus tenderness around both eyes and bilateral cheek area, nasal congestion, pressure behind his ears, yellow rhinorrhea, his face feeling hot, and bilateral itchy ears with white drainage.  He reports that the white drainage from his ears has been ongoing since his sinus symptoms have started back.  Offered to refer him to ENT and he does not wish to do that at this time.  He denies any fevers or chills.  He spoke with his rheumatologist last week and they both decided that they were going to stop the Remicade and are going to start a new medication once his sinus symptoms clear.  He is unaware of the name of the new medication.  He continues to have some wheezing in the morning that will go away as the day progresses.  He denies any coughing, tightness in his chest, shortness of breath.  He is used his albuterol 2 times since his last office visit.  Current medications are as listed in  the chart.   Drug Allergies:  Allergies  Allergen Reactions  . Caffeine Other (See Comments)    Urinary retention and jittery  . Codeine Other (See Comments)    Jittery  . Iodinated Diagnostic Agents Other (See Comments)    Hard on kidneys. The blue dye that went through my IV.   Marland Kitchen Pseudoephedrine Other (See Comments)    Urinary retention  . Simvastatin Other (See Comments)    Hurt my bones  . Penicillins Itching    Review of Systems: Review of Systems  Constitutional: Negative for chills and fever.  HENT:       Reports yellow rhinorrhea, nasal congestion, sinus tenderness .  Eyes:       Reports occasional itchy watery eyes  Respiratory: Positive for wheezing. Negative for cough and shortness of breath.   Cardiovascular: Negative for chest pain and palpitations.  Gastrointestinal: Negative for abdominal pain and heartburn.  Genitourinary: Negative for dysuria.  Skin: Negative for itching and rash.  Neurological: Negative for headaches.  Endo/Heme/Allergies: Negative for environmental allergies.    Physical Exam: BP 130/72   Pulse 76   Resp 18   SpO2 94%    Physical Exam Constitutional:      Appearance: Normal appearance.  HENT:     Head: Normocephalic and atraumatic.     Comments: Pharynx normal. Eyes normal. Ears normal. Nose normal  Right Ear: Tympanic membrane, ear canal and external ear normal.     Left Ear: Tympanic membrane, ear canal and external ear normal.     Ears:     Comments: No drainage noted    Nose: Nose normal.     Mouth/Throat:     Mouth: Mucous membranes are moist.     Pharynx: Oropharynx is clear.  Eyes:     Conjunctiva/sclera: Conjunctivae normal.  Cardiovascular:     Rate and Rhythm: Normal rate and regular rhythm.     Heart sounds: Normal heart sounds.  Pulmonary:     Effort: Pulmonary effort is normal.     Breath sounds: Normal breath sounds.     Comments: Lungs clear to auscultation Musculoskeletal:     Cervical back: Neck  supple.  Skin:    General: Skin is warm.  Neurological:     Mental Status: He is alert and oriented to person, place, and time.  Psychiatric:        Mood and Affect: Mood normal.        Behavior: Behavior normal.        Thought Content: Thought content normal.        Judgment: Judgment normal.     Diagnostics:  None  Assessment and Plan: 1. Acute non-recurrent sinusitis of other sinus   2. Other rhinitis   3. Wheezing   4. Ear drainage, bilateral     No orders of the defined types were placed in this encounter.   Patient Instructions  Sinus Infection We are going to add more days of doxycyline 100 mg twice a day for 10 days  Rhinitis, non-allergic    - environmental allergy panel obtained in 2019 was negative       - continue xyzal 5mg  daily at this time    - use flonase if you are having nasal congestion.  Use 2 sprays each nostril daily for 1-2 weeks at a time before stopping once improved.  If you are not having any congestion then hold flonase    - for nasal drainage/runny nose recommend use of nasal antihistamine, Astelin, 2 sprays each nostril twice a day   Wheezing   - have access to albuterol inhaler 2 puffs every 4-6 hours as needed for cough/wheeze/shortness of breath/chest tightness.  May use 15-20 minutes prior to activity.   Monitor frequency of use.     - use inhaler with spacer chamber   Asthma control goals:   Full participation in all desired activities (may need albuterol before activity)  Albuterol use two time or less a week on average (not counting use with activity)  Cough interfering with sleep two time or less a month  Oral steroids no more than once a year  No hospitalizations  Ear drainage Consider referral to ENT due to bilateral ear drainage.  Follow-up 3-4 months or sooner if needed   Return in about 3 months (around 08/22/2020), or if symptoms worsen or fail to improve.    Thank you for the opportunity to care for this  patient.  Please do not hesitate to contact me with questions.  Althea Charon, FNP [facility]

## 2020-06-06 DIAGNOSIS — Z23 Encounter for immunization: Secondary | ICD-10-CM | POA: Diagnosis not present

## 2020-06-12 ENCOUNTER — Telehealth: Payer: Self-pay | Admitting: *Deleted

## 2020-06-12 NOTE — Telephone Encounter (Signed)
Spoke with Thorsten, he will begin doing the rinses. We are out of samples of Xhance in [place] so we will try to obtain some from [place] and contact him when we do.

## 2020-06-12 NOTE — Telephone Encounter (Signed)
Can he perform nasal saline rinses?  If he can tolerate would recommend performing prior to use of medicated nose spray.  If we have any Xhance samples in office we can have him try that temporarily to see if more effective than Flonase in managing the congestion. Otherwise next option would be seeing ENT for a sinus evaluation.

## 2020-06-12 NOTE — Telephone Encounter (Signed)
[name] calls stating that he completed his Doxycyline and he is still having lots of symptoms. His biggest complaint is sinus pressure/fullness. He said he feels like his whole head is stopped up. He has lots of PND and his eyes are "all mattered up first thing in the morning". His ears are stopped up and "feel hot". He is using all meds as directed, including his nasal sprays. Please advise.

## 2020-06-18 ENCOUNTER — Encounter (INDEPENDENT_AMBULATORY_CARE_PROVIDER_SITE_OTHER): Payer: Medicare Other | Admitting: Ophthalmology

## 2020-06-18 ENCOUNTER — Other Ambulatory Visit: Payer: Self-pay

## 2020-06-18 DIAGNOSIS — D3132 Benign neoplasm of left choroid: Secondary | ICD-10-CM

## 2020-06-18 DIAGNOSIS — H35033 Hypertensive retinopathy, bilateral: Secondary | ICD-10-CM | POA: Diagnosis not present

## 2020-06-18 DIAGNOSIS — H353221 Exudative age-related macular degeneration, left eye, with active choroidal neovascularization: Secondary | ICD-10-CM

## 2020-06-18 DIAGNOSIS — H353112 Nonexudative age-related macular degeneration, right eye, intermediate dry stage: Secondary | ICD-10-CM

## 2020-06-18 DIAGNOSIS — H43813 Vitreous degeneration, bilateral: Secondary | ICD-10-CM | POA: Diagnosis not present

## 2020-06-18 DIAGNOSIS — I10 Essential (primary) hypertension: Secondary | ICD-10-CM

## 2020-06-18 NOTE — Telephone Encounter (Signed)
[name] did bring a couple by today.

## 2020-06-19 DIAGNOSIS — M5136 Other intervertebral disc degeneration, lumbar region: Secondary | ICD-10-CM | POA: Diagnosis not present

## 2020-06-20 NOTE — Telephone Encounter (Signed)
Patient informed, samples ready for pick up.

## 2020-06-25 ENCOUNTER — Inpatient Hospital Stay (HOSPITAL_BASED_OUTPATIENT_CLINIC_OR_DEPARTMENT_OTHER): Payer: Medicare Other | Admitting: Hematology & Oncology

## 2020-06-25 ENCOUNTER — Inpatient Hospital Stay: Payer: Medicare Other | Attending: Hematology & Oncology

## 2020-06-25 ENCOUNTER — Other Ambulatory Visit: Payer: Self-pay

## 2020-06-25 ENCOUNTER — Encounter: Payer: Self-pay | Admitting: Hematology & Oncology

## 2020-06-25 VITALS — BP 155/72 | HR 86 | Temp 98.5°F | Resp 16 | Ht 70.0 in | Wt 217.0 lb

## 2020-06-25 DIAGNOSIS — D45 Polycythemia vera: Secondary | ICD-10-CM | POA: Diagnosis not present

## 2020-06-25 DIAGNOSIS — Z7952 Long term (current) use of systemic steroids: Secondary | ICD-10-CM | POA: Insufficient documentation

## 2020-06-25 DIAGNOSIS — T387X5A Adverse effect of androgens and anabolic congeners, initial encounter: Secondary | ICD-10-CM | POA: Diagnosis not present

## 2020-06-25 DIAGNOSIS — Z880 Allergy status to penicillin: Secondary | ICD-10-CM | POA: Insufficient documentation

## 2020-06-25 DIAGNOSIS — D751 Secondary polycythemia: Secondary | ICD-10-CM | POA: Insufficient documentation

## 2020-06-25 DIAGNOSIS — Z79899 Other long term (current) drug therapy: Secondary | ICD-10-CM | POA: Diagnosis not present

## 2020-06-25 LAB — CMP (CANCER CENTER ONLY)
ALT: 10 U/L (ref 0–44)
AST: 12 U/L — ABNORMAL LOW (ref 15–41)
Albumin: 3.9 g/dL (ref 3.5–5.0)
Alkaline Phosphatase: 74 U/L (ref 38–126)
Anion gap: 6 (ref 5–15)
BUN: 25 mg/dL — ABNORMAL HIGH (ref 8–23)
CO2: 31 mmol/L (ref 22–32)
Calcium: 9.6 mg/dL (ref 8.9–10.3)
Chloride: 103 mmol/L (ref 98–111)
Creatinine: 1.86 mg/dL — ABNORMAL HIGH (ref 0.61–1.24)
GFR, Est AFR Am: 40 mL/min — ABNORMAL LOW (ref >60)
GFR, Estimated: 34 mL/min — ABNORMAL LOW (ref >60)
Glucose, Bld: 102 mg/dL — ABNORMAL HIGH (ref 70–99)
Potassium: 4.2 mmol/L (ref 3.5–5.1)
Sodium: 140 mmol/L (ref 135–145)
Total Bilirubin: 1.1 mg/dL (ref 0.3–1.2)
Total Protein: 6.3 g/dL — ABNORMAL LOW (ref 6.5–8.1)

## 2020-06-25 LAB — CBC WITH DIFFERENTIAL (CANCER CENTER ONLY)
Abs Immature Granulocytes: 0.02 10*3/uL (ref 0.00–0.07)
Basophils Absolute: 0.1 10*3/uL (ref 0.0–0.1)
Basophils Relative: 1 %
Eosinophils Absolute: 0.1 10*3/uL (ref 0.0–0.5)
Eosinophils Relative: 2 %
HCT: 47.3 % (ref 39.0–52.0)
Hemoglobin: 15.2 g/dL (ref 13.0–17.0)
Immature Granulocytes: 0 %
Lymphocytes Relative: 32 %
Lymphs Abs: 2 10*3/uL (ref 0.7–4.0)
MCH: 28.4 pg (ref 26.0–34.0)
MCHC: 32.1 g/dL (ref 30.0–36.0)
MCV: 88.2 fL (ref 80.0–100.0)
Monocytes Absolute: 0.8 10*3/uL (ref 0.1–1.0)
Monocytes Relative: 12 %
Neutro Abs: 3.4 10*3/uL (ref 1.7–7.7)
Neutrophils Relative %: 53 %
Platelet Count: 226 10*3/uL (ref 150–400)
RBC: 5.36 MIL/uL (ref 4.22–5.81)
RDW: 13.5 % (ref 11.5–15.5)
WBC Count: 6.4 10*3/uL (ref 4.0–10.5)
nRBC: 0 % (ref 0.0–0.2)

## 2020-06-25 LAB — IRON AND TIBC
Iron: 125 ug/dL (ref 42–163)
Saturation Ratios: 35 % (ref 20–55)
TIBC: 360 ug/dL (ref 202–409)
UIBC: 235 ug/dL (ref 117–376)

## 2020-06-25 LAB — RETICULOCYTES
Immature Retic Fract: 10.9 % (ref 2.3–15.9)
RBC.: 5.28 MIL/uL (ref 4.22–5.81)
Retic Count, Absolute: 70.8 10*3/uL (ref 19.0–186.0)
Retic Ct Pct: 1.3 % (ref 0.4–3.1)

## 2020-06-25 LAB — FERRITIN: Ferritin: 15 ng/mL — ABNORMAL LOW (ref 24–336)

## 2020-06-25 MED ORDER — AMOXICILLIN-POT CLAVULANATE 875-125 MG PO TABS: 1.0000 | ORAL_TABLET | Freq: Two times a day (BID) | ORAL | 0 refills | Status: DC

## 2020-06-25 NOTE — Progress Notes (Signed)
Hematology and Oncology Follow Up Visit  [name] [id number] [birth year] 74 y.o. 06/25/2020   Principle Diagnosis:   Secondary polycythemia due to testosterone injections  Current Therapy:    Patient donating blood every 2 months.     Interim History:  [name] is back for follow-up.  He is doing quite well.  He is donating blood every 2 months.  He has given blood 3 times since we last saw him.  He is still doing the testosterone injections.  He needs these as they do make him feel better.  He has had no problems with headache.  He has had no pruritus.  He has had no problems with leg swelling.  There is no pain in the hands or feet.  He has had no nausea or vomiting.  Problem right now is sinus infection.  He has a lot of discharge when he blows his nose.  He has been on doxycycline.  I will try him on some Augmentin.  I realize that he has a penicillin "allergy."  This is just itching.  He has had Augmentin before and says that it works.  He has had no rashes.  Overall, his performance status is ECOG 1.  Medications:  Current Outpatient Medications:  .  albuterol (PROAIR HFA) 108 (90 Base) MCG/ACT inhaler, Can inhale two puffs every 4-6 hours if needed for cough or wheeze., Disp: 8.5 g, Rfl: 1 .  allopurinol (ZYLOPRIM) 100 MG tablet, Take 100 mg by mouth 2 (two) times daily. , Disp: , Rfl:  .  amLODipine (NORVASC) 2.5 MG tablet, Take 2.5 mg by mouth daily., Disp: , Rfl:  .  amoxicillin-clavulanate (AUGMENTIN) 875-125 MG tablet, Take 1 tablet by mouth 2 (two) times daily., Disp: 14 tablet, Rfl: 0 .  azelastine (ASTELIN) 0.1 % nasal spray, Use two sprays in each nostril twice daily as directed for nasal drainage., Disp: 30 mL, Rfl: 5 .  Cholecalciferol (VITAMIN D3 PO), Take by mouth daily., Disp: , Rfl:  .  doxazosin (CARDURA) 2 MG tablet, Take 2 mg by mouth every morning., Disp: , Rfl:  .  doxycycline (VIBRA-TABS) 100 MG tablet, Take 100 mg by mouth 2 (two) times  daily., Disp: , Rfl:  .  fluticasone (FLONASE) 50 MCG/ACT nasal spray, Can use two sprays in each nostril once daily as directed for nasal congestion., Disp: 16 g, Rfl: 5 .  furosemide (LASIX) 40 MG tablet, Take 40 mg by mouth daily., Disp: , Rfl: 3 .  HYDROcodone-acetaminophen (NORCO) 10-325 MG tablet, Take 1 tablet by mouth 3 (three) times daily as needed., Disp: , Rfl: 0 .  levocetirizine (XYZAL) 5 MG tablet, Can take one tablet by mouth once daily as directed., Disp: 30 tablet, Rfl: 5 .  methocarbamol (ROBAXIN) 500 MG tablet, Take 500 mg by mouth 3 (three) times daily as needed., Disp: , Rfl:  .  omeprazole (PRILOSEC) 20 MG capsule, Take 20 mg by mouth 2 (two) times daily., Disp: , Rfl:  .  potassium citrate (UROCIT-K) 10 MEQ (1080 MG) SR tablet, Take 10 mEq by mouth 2 (two) times daily., Disp: , Rfl:  .  predniSONE (DELTASONE) 5 MG tablet, Take 5 mg by mouth 2 (two) times daily., Disp: , Rfl:  .  testosterone cypionate (DEPOTESTOSTERONE CYPIONATE) 200 MG/ML injection, SMARTSIG:0.5 Milliliter(s) IM Every 2 Weeks, Disp: , Rfl:  .  Testosterone Cypionate 200 MG/ML SOLN, testosterone cypionate 200 mg/mL intramuscular oil  inject 0.5 milliliter intramuscularly every week, Disp: , Rfl:  Allergies:  Allergies  Allergen Reactions  . Caffeine Other (See Comments)    Urinary retention and jittery  . Codeine Other (See Comments)    Jittery  . Iodinated Diagnostic Agents Other (See Comments)    Hard on kidneys. The blue dye that went through my IV.   Marland Kitchen Pseudoephedrine Other (See Comments)    Urinary retention  . Simvastatin Other (See Comments)    Hurt my bones  . Penicillins Itching    Past Medical History, Surgical history, Social history, and Family History were reviewed and updated.  Review of Systems: Review of Systems  Constitutional: Negative.   HENT:  Negative.   Eyes: Negative.   Respiratory: Negative.   Cardiovascular: Negative.   Gastrointestinal: Negative.   Endocrine:  Negative.   Genitourinary: Negative.    Musculoskeletal: Negative.   Skin: Negative.   Neurological: Negative.   Hematological: Negative.   Psychiatric/Behavioral: Negative.     Physical Exam:  height is 5\' 10"  (1.778 m) and weight is 217 lb (98.4 kg). His oral temperature is 98.5 F (36.9 C). His blood pressure is 155/72 (abnormal) and his pulse is 86. His respiration is 16 and oxygen saturation is 97%.   Wt Readings from Last 3 Encounters:  06/25/20 217 lb (98.4 kg)  04/17/20 220 lb (99.8 kg)  01/23/20 219 lb (99.3 kg)    Physical Exam Vitals reviewed.  HENT:     Head: Normocephalic and atraumatic.  Eyes:     Pupils: Pupils are equal, round, and reactive to light.  Cardiovascular:     Rate and Rhythm: Normal rate and regular rhythm.     Heart sounds: Normal heart sounds.  Pulmonary:     Effort: Pulmonary effort is normal.     Breath sounds: Normal breath sounds.  Abdominal:     General: Bowel sounds are normal.     Palpations: Abdomen is soft.  Musculoskeletal:        General: No tenderness or deformity. Normal range of motion.     Cervical back: Normal range of motion.  Lymphadenopathy:     Cervical: No cervical adenopathy.  Skin:    General: Skin is warm and dry.     Findings: No erythema or rash.  Neurological:     Mental Status: He is alert and oriented to person, place, and time.  Psychiatric:        Behavior: Behavior normal.        Thought Content: Thought content normal.        Judgment: Judgment normal.      Lab Results  Component Value Date   WBC 6.4 06/25/2020   HGB 15.2 06/25/2020   HCT 47.3 06/25/2020   MCV 88.2 06/25/2020   PLT 226 06/25/2020     Chemistry      Component Value Date/Time   NA 140 06/25/2020 0848   K 4.2 06/25/2020 0848   CL 103 06/25/2020 0848   CO2 31 06/25/2020 0848   BUN 25 (H) 06/25/2020 0848   CREATININE 1.86 (H) 06/25/2020 0848      Component Value Date/Time   CALCIUM 9.6 06/25/2020 0848   ALKPHOS 74  06/25/2020 0848   AST 12 (L) 06/25/2020 0848   ALT 10 06/25/2020 0848   BILITOT 1.1 06/25/2020 0848      Impression and Plan: [name] is a very nice 74 year old white male.  He has secondary polycythemia.  He is on testosterone injections which he needs.  I told him that he can  continue to donate blood.  His blood is perfect.  There is nothing wrong with his blood.  He has O+ blood which can definitely be used in the community.  We will plan to get him back in 3 months.  He will continue to donate blood.   Volanda Napoleon, MD 9/20/20219:59 AM

## 2020-06-26 LAB — ERYTHROPOIETIN: Erythropoietin: 15.6 m[IU]/mL (ref 2.6–18.5)

## 2020-07-16 ENCOUNTER — Other Ambulatory Visit: Payer: Self-pay

## 2020-07-16 ENCOUNTER — Encounter (INDEPENDENT_AMBULATORY_CARE_PROVIDER_SITE_OTHER): Payer: Medicare Other | Admitting: Ophthalmology

## 2020-07-16 DIAGNOSIS — H353221 Exudative age-related macular degeneration, left eye, with active choroidal neovascularization: Secondary | ICD-10-CM | POA: Diagnosis not present

## 2020-07-16 DIAGNOSIS — H43813 Vitreous degeneration, bilateral: Secondary | ICD-10-CM

## 2020-07-16 DIAGNOSIS — D3132 Benign neoplasm of left choroid: Secondary | ICD-10-CM | POA: Diagnosis not present

## 2020-07-16 DIAGNOSIS — H353111 Nonexudative age-related macular degeneration, right eye, early dry stage: Secondary | ICD-10-CM | POA: Diagnosis not present

## 2020-07-16 DIAGNOSIS — H35033 Hypertensive retinopathy, bilateral: Secondary | ICD-10-CM

## 2020-07-16 DIAGNOSIS — I10 Essential (primary) hypertension: Secondary | ICD-10-CM

## 2020-07-23 DIAGNOSIS — E669 Obesity, unspecified: Secondary | ICD-10-CM | POA: Diagnosis not present

## 2020-07-23 DIAGNOSIS — I129 Hypertensive chronic kidney disease with stage 1 through stage 4 chronic kidney disease, or unspecified chronic kidney disease: Secondary | ICD-10-CM | POA: Diagnosis not present

## 2020-07-23 DIAGNOSIS — J329 Chronic sinusitis, unspecified: Secondary | ICD-10-CM | POA: Diagnosis not present

## 2020-07-23 DIAGNOSIS — D751 Secondary polycythemia: Secondary | ICD-10-CM | POA: Diagnosis not present

## 2020-07-23 DIAGNOSIS — J45909 Unspecified asthma, uncomplicated: Secondary | ICD-10-CM | POA: Diagnosis not present

## 2020-07-23 DIAGNOSIS — M069 Rheumatoid arthritis, unspecified: Secondary | ICD-10-CM | POA: Diagnosis not present

## 2020-07-23 DIAGNOSIS — E291 Testicular hypofunction: Secondary | ICD-10-CM | POA: Diagnosis not present

## 2020-07-23 DIAGNOSIS — J309 Allergic rhinitis, unspecified: Secondary | ICD-10-CM | POA: Diagnosis not present

## 2020-07-23 DIAGNOSIS — M1990 Unspecified osteoarthritis, unspecified site: Secondary | ICD-10-CM | POA: Diagnosis not present

## 2020-07-23 DIAGNOSIS — N1832 Chronic kidney disease, stage 3b: Secondary | ICD-10-CM | POA: Diagnosis not present

## 2020-07-23 DIAGNOSIS — E785 Hyperlipidemia, unspecified: Secondary | ICD-10-CM | POA: Diagnosis not present

## 2020-07-23 DIAGNOSIS — M109 Gout, unspecified: Secondary | ICD-10-CM | POA: Diagnosis not present

## 2020-08-08 ENCOUNTER — Ambulatory Visit (INDEPENDENT_AMBULATORY_CARE_PROVIDER_SITE_OTHER): Payer: Medicare Other | Admitting: Sports Medicine

## 2020-08-08 ENCOUNTER — Other Ambulatory Visit: Payer: Self-pay

## 2020-08-08 ENCOUNTER — Encounter: Payer: Self-pay | Admitting: Sports Medicine

## 2020-08-08 DIAGNOSIS — B351 Tinea unguium: Secondary | ICD-10-CM | POA: Diagnosis not present

## 2020-08-08 DIAGNOSIS — M79675 Pain in left toe(s): Secondary | ICD-10-CM | POA: Diagnosis not present

## 2020-08-08 DIAGNOSIS — I739 Peripheral vascular disease, unspecified: Secondary | ICD-10-CM

## 2020-08-08 DIAGNOSIS — M79674 Pain in right toe(s): Secondary | ICD-10-CM

## 2020-08-08 NOTE — Progress Notes (Signed)
Subjective: [name] is a 74 y.o. male patient seen today in office with complaint of mildly painful thickened and elongated toenails; unable to trim. Reports that the left hallux gets sore and digs in. Patient denies history of Diabetes, Neuropathy, or known Vascular disease. Patient has no other pedal complaints at this time.   Patient Active Problem List   Diagnosis Date Noted  . OA (osteoarthritis) of knee 06/17/2013  . Onychomycosis 03/21/2012  . Sinusitis 12/10/2011  . BPH (benign prostatic hypertrophy) with urinary obstruction 09/01/2011  . Arthralgia 09/01/2011  . SHOULDER PAIN, LEFT 07/23/2010  . NOCTURIA 07/23/2010  . IBS 05/28/2010  . UNSPECIFIED TESTICULAR DYSFUNCTION 04/11/2010  . UNSPECIFIED VITAMIN D DEFICIENCY 04/11/2010  . MYOSITIS 04/02/2010  . ARTHRITIS, CERVICAL SPINE 02/12/2010  . OSTEOPENIA 11/27/2009  . OSTEOARTHRITIS, ANKLES, BILATERAL 05/31/2009  . ANXIETY 03/13/2009  . BACK PAIN, CHRONIC 09/14/2008  . URETERAL CALCULUS, HX OF 05/30/2008  . ASTHMA 12/15/2007  . INSOMNIA 12/15/2007  . GERD 09/28/2007  . HYPOTHYROIDISM 09/07/2007  . HYPERLIPIDEMIA 09/07/2007  . UNS ADVRS EFF OTH RX MEDICINAL&BIOLOGICAL SBSTNC 09/07/2007    Current Outpatient Medications on File Prior to Visit  Medication Sig Dispense Refill  . albuterol (PROAIR HFA) 108 (90 Base) MCG/ACT inhaler Can inhale two puffs every 4-6 hours if needed for cough or wheeze. 8.5 g 1  . allopurinol (ZYLOPRIM) 100 MG tablet Take 100 mg by mouth 2 (two) times daily.     Marland Kitchen amLODipine (NORVASC) 10 MG tablet Take 10 mg by mouth daily.    Marland Kitchen amLODipine (NORVASC) 2.5 MG tablet Take 2.5 mg by mouth daily.    Marland Kitchen amoxicillin-clavulanate (AUGMENTIN) 875-125 MG tablet Take 1 tablet by mouth 2 (two) times daily. 14 tablet 0  . azelastine (ASTELIN) 0.1 % nasal spray Use two sprays in each nostril twice daily as directed for nasal drainage. 30 mL 5  . Cholecalciferol (VITAMIN D3 PO) Take by mouth daily.    Marland Kitchen  doxazosin (CARDURA) 2 MG tablet Take 2 mg by mouth every morning.    Marland Kitchen doxycycline (VIBRA-TABS) 100 MG tablet Take 100 mg by mouth 2 (two) times daily.    . fluticasone (FLONASE) 50 MCG/ACT nasal spray Can use two sprays in each nostril once daily as directed for nasal congestion. 16 g 5  . furosemide (LASIX) 40 MG tablet Take 40 mg by mouth daily.  3  . HYDROcodone-acetaminophen (NORCO) 10-325 MG tablet Take 1 tablet by mouth 3 (three) times daily as needed.  0  . levocetirizine (XYZAL) 5 MG tablet Can take one tablet by mouth once daily as directed. 30 tablet 5  . methocarbamol (ROBAXIN) 500 MG tablet Take 500 mg by mouth 3 (three) times daily as needed.    Marland Kitchen ofloxacin (OCUFLOX) 0.3 % ophthalmic solution     . omeprazole (PRILOSEC) 20 MG capsule Take 20 mg by mouth 2 (two) times daily.    . potassium citrate (UROCIT-K) 10 MEQ (1080 MG) SR tablet Take 10 mEq by mouth 2 (two) times daily.    . predniSONE (DELTASONE) 5 MG tablet Take 5 mg by mouth 2 (two) times daily.    Marland Kitchen testosterone cypionate (DEPOTESTOSTERONE CYPIONATE) 200 MG/ML injection SMARTSIG:0.5 Milliliter(s) IM Every 2 Weeks    . Testosterone Cypionate 200 MG/ML SOLN testosterone cypionate 200 mg/mL intramuscular oil  inject 0.5 milliliter intramuscularly every week     No current facility-administered medications on file prior to visit.    Allergies  Allergen Reactions  . Caffeine Other (  See Comments)    Urinary retention and jittery  . Codeine Other (See Comments)    Jittery  . Iodinated Diagnostic Agents Other (See Comments)    Hard on kidneys. The blue dye that went through my IV.   Marland Kitchen Pseudoephedrine Other (See Comments)    Urinary retention  . Simvastatin Other (See Comments)    Hurt my bones  . Penicillins Itching    Objective: Physical Exam  General: Well developed, nourished, no acute distress, awake, alert and oriented x 3  Vascular: Dorsalis pedis artery 1/4 bilateral, Posterior tibial artery 0/4 bilateral,  skin temperature warm to warm proximal to distal bilateral lower extremities, no varicosities, scant pedal hair present bilateral.  Neurological: Gross sensation present via light touch bilateral.   Dermatological: Skin is warm, dry, and supple bilateral, Nails 1-10 are tender, long, thick, and discolored with mild subungal debris and incurvation at left hallux with no infection, no webspace macerations present bilateral, no open lesions present bilateral, no callus/corns/hyperkeratotic tissue present bilateral. No signs of infection bilateral.  Musculoskeletal: Asymptomatic bunion and hammertoe boney deformities noted bilateral. Muscular strength within normal limits without painon range of motion. No pain with calf compression bilateral.  Assessment and Plan:  Problem List Items Addressed This Visit    None    Visit Diagnoses    Pain due to onychomycosis of toenails of both feet    -  Primary   PVD (peripheral vascular disease) ([place])       Relevant Medications   amLODipine (NORVASC) 10 MG tablet      -Examined patient.  -Discussed treatment options for painful mycotic nails. -Mechanically debrided and reduced mycotic nails with sterile nail nipper and dremel nail file without incident. -Patient to return in 3 months for follow up evaluation or sooner if symptoms worsen.  Landis Martins, DPM

## 2020-08-13 ENCOUNTER — Encounter (INDEPENDENT_AMBULATORY_CARE_PROVIDER_SITE_OTHER): Payer: Medicare Other | Admitting: Ophthalmology

## 2020-08-13 ENCOUNTER — Other Ambulatory Visit: Payer: Self-pay

## 2020-08-13 DIAGNOSIS — H43813 Vitreous degeneration, bilateral: Secondary | ICD-10-CM

## 2020-08-13 DIAGNOSIS — D3132 Benign neoplasm of left choroid: Secondary | ICD-10-CM

## 2020-08-13 DIAGNOSIS — I10 Essential (primary) hypertension: Secondary | ICD-10-CM | POA: Diagnosis not present

## 2020-08-13 DIAGNOSIS — H353221 Exudative age-related macular degeneration, left eye, with active choroidal neovascularization: Secondary | ICD-10-CM

## 2020-08-13 DIAGNOSIS — H35033 Hypertensive retinopathy, bilateral: Secondary | ICD-10-CM

## 2020-08-13 DIAGNOSIS — H353112 Nonexudative age-related macular degeneration, right eye, intermediate dry stage: Secondary | ICD-10-CM

## 2020-08-20 DIAGNOSIS — Z23 Encounter for immunization: Secondary | ICD-10-CM | POA: Diagnosis not present

## 2020-08-22 DIAGNOSIS — E663 Overweight: Secondary | ICD-10-CM | POA: Diagnosis not present

## 2020-08-22 DIAGNOSIS — M0579 Rheumatoid arthritis with rheumatoid factor of multiple sites without organ or systems involvement: Secondary | ICD-10-CM | POA: Diagnosis not present

## 2020-08-22 DIAGNOSIS — Z6829 Body mass index (BMI) 29.0-29.9, adult: Secondary | ICD-10-CM | POA: Diagnosis not present

## 2020-08-22 DIAGNOSIS — N1832 Chronic kidney disease, stage 3b: Secondary | ICD-10-CM | POA: Diagnosis not present

## 2020-08-22 DIAGNOSIS — M1A09X1 Idiopathic chronic gout, multiple sites, with tophus (tophi): Secondary | ICD-10-CM | POA: Diagnosis not present

## 2020-08-22 DIAGNOSIS — M150 Primary generalized (osteo)arthritis: Secondary | ICD-10-CM | POA: Diagnosis not present

## 2020-08-22 DIAGNOSIS — M5136 Other intervertebral disc degeneration, lumbar region: Secondary | ICD-10-CM | POA: Diagnosis not present

## 2020-09-04 DIAGNOSIS — L578 Other skin changes due to chronic exposure to nonionizing radiation: Secondary | ICD-10-CM | POA: Diagnosis not present

## 2020-09-04 DIAGNOSIS — L821 Other seborrheic keratosis: Secondary | ICD-10-CM | POA: Diagnosis not present

## 2020-09-04 DIAGNOSIS — L820 Inflamed seborrheic keratosis: Secondary | ICD-10-CM | POA: Diagnosis not present

## 2020-09-14 ENCOUNTER — Encounter (INDEPENDENT_AMBULATORY_CARE_PROVIDER_SITE_OTHER): Payer: Medicare Other | Admitting: Ophthalmology

## 2020-09-14 ENCOUNTER — Other Ambulatory Visit: Payer: Self-pay

## 2020-09-14 DIAGNOSIS — H353221 Exudative age-related macular degeneration, left eye, with active choroidal neovascularization: Secondary | ICD-10-CM | POA: Diagnosis not present

## 2020-09-14 DIAGNOSIS — H353112 Nonexudative age-related macular degeneration, right eye, intermediate dry stage: Secondary | ICD-10-CM | POA: Diagnosis not present

## 2020-09-14 DIAGNOSIS — D3132 Benign neoplasm of left choroid: Secondary | ICD-10-CM

## 2020-09-14 DIAGNOSIS — H43813 Vitreous degeneration, bilateral: Secondary | ICD-10-CM | POA: Diagnosis not present

## 2020-09-14 DIAGNOSIS — H35033 Hypertensive retinopathy, bilateral: Secondary | ICD-10-CM

## 2020-09-14 DIAGNOSIS — I10 Essential (primary) hypertension: Secondary | ICD-10-CM

## 2020-09-24 ENCOUNTER — Inpatient Hospital Stay: Payer: Medicare Other | Attending: Hematology & Oncology

## 2020-09-24 ENCOUNTER — Other Ambulatory Visit: Payer: Self-pay

## 2020-09-24 ENCOUNTER — Inpatient Hospital Stay (HOSPITAL_BASED_OUTPATIENT_CLINIC_OR_DEPARTMENT_OTHER): Payer: Medicare Other | Admitting: Hematology & Oncology

## 2020-09-24 ENCOUNTER — Encounter: Payer: Self-pay | Admitting: Hematology & Oncology

## 2020-09-24 VITALS — BP 132/61 | HR 89 | Temp 98.7°F | Resp 18 | Wt 220.0 lb

## 2020-09-24 DIAGNOSIS — D500 Iron deficiency anemia secondary to blood loss (chronic): Secondary | ICD-10-CM | POA: Diagnosis not present

## 2020-09-24 DIAGNOSIS — E291 Testicular hypofunction: Secondary | ICD-10-CM | POA: Insufficient documentation

## 2020-09-24 DIAGNOSIS — N183 Chronic kidney disease, stage 3 unspecified: Secondary | ICD-10-CM | POA: Diagnosis not present

## 2020-09-24 DIAGNOSIS — F5101 Primary insomnia: Secondary | ICD-10-CM

## 2020-09-24 DIAGNOSIS — T387X5A Adverse effect of androgens and anabolic congeners, initial encounter: Secondary | ICD-10-CM | POA: Insufficient documentation

## 2020-09-24 DIAGNOSIS — D751 Secondary polycythemia: Secondary | ICD-10-CM | POA: Insufficient documentation

## 2020-09-24 DIAGNOSIS — D45 Polycythemia vera: Secondary | ICD-10-CM

## 2020-09-24 LAB — CBC WITH DIFFERENTIAL (CANCER CENTER ONLY)
Abs Immature Granulocytes: 0.03 10*3/uL (ref 0.00–0.07)
Basophils Absolute: 0 10*3/uL (ref 0.0–0.1)
Basophils Relative: 1 %
Eosinophils Absolute: 0.1 10*3/uL (ref 0.0–0.5)
Eosinophils Relative: 2 %
HCT: 44 % (ref 39.0–52.0)
Hemoglobin: 13.9 g/dL (ref 13.0–17.0)
Immature Granulocytes: 1 %
Lymphocytes Relative: 30 %
Lymphs Abs: 1.7 10*3/uL (ref 0.7–4.0)
MCH: 26.6 pg (ref 26.0–34.0)
MCHC: 31.6 g/dL (ref 30.0–36.0)
MCV: 84.1 fL (ref 80.0–100.0)
Monocytes Absolute: 0.7 10*3/uL (ref 0.1–1.0)
Monocytes Relative: 12 %
Neutro Abs: 3.1 10*3/uL (ref 1.7–7.7)
Neutrophils Relative %: 54 %
Platelet Count: 229 10*3/uL (ref 150–400)
RBC: 5.23 MIL/uL (ref 4.22–5.81)
RDW: 14.4 % (ref 11.5–15.5)
WBC Count: 5.7 10*3/uL (ref 4.0–10.5)
nRBC: 0 % (ref 0.0–0.2)

## 2020-09-24 LAB — CMP (CANCER CENTER ONLY)
ALT: 8 U/L (ref 0–44)
AST: 10 U/L — ABNORMAL LOW (ref 15–41)
Albumin: 3.8 g/dL (ref 3.5–5.0)
Alkaline Phosphatase: 62 U/L (ref 38–126)
Anion gap: 9 (ref 5–15)
BUN: 23 mg/dL (ref 8–23)
CO2: 30 mmol/L (ref 22–32)
Calcium: 9.8 mg/dL (ref 8.9–10.3)
Chloride: 101 mmol/L (ref 98–111)
Creatinine: 1.69 mg/dL — ABNORMAL HIGH (ref 0.61–1.24)
GFR, Estimated: 42 mL/min — ABNORMAL LOW (ref >60)
Glucose, Bld: 126 mg/dL — ABNORMAL HIGH (ref 70–99)
Potassium: 3.8 mmol/L (ref 3.5–5.1)
Sodium: 140 mmol/L (ref 135–145)
Total Bilirubin: 0.9 mg/dL (ref 0.3–1.2)
Total Protein: 6.2 g/dL — ABNORMAL LOW (ref 6.5–8.1)

## 2020-09-24 LAB — IRON AND TIBC
Iron: 33 ug/dL — ABNORMAL LOW (ref 42–163)
Saturation Ratios: 8 % — ABNORMAL LOW (ref 20–55)
TIBC: 390 ug/dL (ref 202–409)
UIBC: 357 ug/dL (ref 117–376)

## 2020-09-24 LAB — FERRITIN: Ferritin: 10 ng/mL — ABNORMAL LOW (ref 24–336)

## 2020-09-24 NOTE — Progress Notes (Signed)
Hematology and Oncology Follow Up Visit  MENG WINTERTON [id number] 27-Nov-1945 74 y.o. 09/24/2020   Principle Diagnosis:   Secondary polycythemia due to testosterone injections  Current Therapy:    Patient donating blood every 2 months.     Interim History:  Mr. Segundo is back for follow-up.  He is doing quite well.  He really has had no specific complaints since we last saw him.  There is been no problems with nausea or vomiting.  There is been no problems with headache.  He is donating his blood every couple months.  I told him that if he wanted to, he could donate blood every 3 months.  This might be a little easier for him.  He has had no fever.  He has had no cough or shortness of breath.  There has been no change in bowel or bladder habits.  He still is doing testosterone injections.  This seems to help his quality of life.  Currently, his performance status is ECOG 1.   Medications:  Current Outpatient Medications:  .  albuterol (PROAIR HFA) 108 (90 Base) MCG/ACT inhaler, Can inhale two puffs every 4-6 hours if needed for cough or wheeze., Disp: 8.5 g, Rfl: 1 .  allopurinol (ZYLOPRIM) 100 MG tablet, Take 100 mg by mouth 2 (two) times daily. , Disp: , Rfl:  .  amLODipine (NORVASC) 2.5 MG tablet, Take 2.5 mg by mouth daily., Disp: , Rfl:  .  azelastine (ASTELIN) 0.1 % nasal spray, Use two sprays in each nostril twice daily as directed for nasal drainage., Disp: 30 mL, Rfl: 5 .  Cholecalciferol (VITAMIN D3 PO), Take by mouth daily., Disp: , Rfl:  .  doxazosin (CARDURA) 2 MG tablet, Take 2 mg by mouth every morning., Disp: , Rfl:  .  fluticasone (FLONASE) 50 MCG/ACT nasal spray, Can use two sprays in each nostril once daily as directed for nasal congestion., Disp: 16 g, Rfl: 5 .  furosemide (LASIX) 40 MG tablet, Take 40 mg by mouth daily., Disp: , Rfl: 3 .  HYDROcodone-acetaminophen (NORCO) 10-325 MG tablet, Take 1 tablet by mouth 3 (three) times daily as needed., Disp: ,  Rfl: 0 .  levocetirizine (XYZAL) 5 MG tablet, Can take one tablet by mouth once daily as directed., Disp: 30 tablet, Rfl: 5 .  methocarbamol (ROBAXIN) 500 MG tablet, Take 500 mg by mouth 3 (three) times daily as needed., Disp: , Rfl:  .  ofloxacin (OCUFLOX) 0.3 % ophthalmic solution, , Disp: , Rfl:  .  omeprazole (PRILOSEC) 20 MG capsule, Take 20 mg by mouth 2 (two) times daily., Disp: , Rfl:  .  potassium citrate (UROCIT-K) 10 MEQ (1080 MG) SR tablet, Take 10 mEq by mouth 2 (two) times daily., Disp: , Rfl:  .  predniSONE (DELTASONE) 5 MG tablet, Take 5 mg by mouth 2 (two) times daily., Disp: , Rfl:  .  Testosterone Cypionate 200 MG/ML SOLN, testosterone cypionate 200 mg/mL intramuscular oil  inject 0.5 milliliter intramuscularly every week, Disp: , Rfl:   Allergies:  Allergies  Allergen Reactions  . Caffeine Other (See Comments)    Urinary retention and jittery  . Codeine Other (See Comments)    Jittery  . Iodinated Diagnostic Agents Other (See Comments)    Hard on kidneys. The blue dye that went through my IV.   Marland Kitchen Pseudoephedrine Other (See Comments)    Urinary retention  . Simvastatin Other (See Comments)    Hurt my bones  . Penicillins Itching  Past Medical History, Surgical history, Social history, and Family History were reviewed and updated.  Review of Systems: Review of Systems  Constitutional: Negative.   HENT:  Negative.   Eyes: Negative.   Respiratory: Negative.   Cardiovascular: Negative.   Gastrointestinal: Negative.   Endocrine: Negative.   Genitourinary: Negative.    Musculoskeletal: Negative.   Skin: Negative.   Neurological: Negative.   Hematological: Negative.   Psychiatric/Behavioral: Negative.     Physical Exam:  weight is 220 lb (99.8 kg). His temperature is 98.7 F (37.1 C). His blood pressure is 132/61 and his pulse is 89. His respiration is 18 and oxygen saturation is 95%.   Wt Readings from Last 3 Encounters:  09/24/20 220 lb (99.8 kg)   06/25/20 217 lb (98.4 kg)  04/17/20 220 lb (99.8 kg)    Physical Exam Vitals reviewed.  HENT:     Head: Normocephalic and atraumatic.  Eyes:     Pupils: Pupils are equal, round, and reactive to light.  Cardiovascular:     Rate and Rhythm: Normal rate and regular rhythm.     Heart sounds: Normal heart sounds.  Pulmonary:     Effort: Pulmonary effort is normal.     Breath sounds: Normal breath sounds.  Abdominal:     General: Bowel sounds are normal.     Palpations: Abdomen is soft.  Musculoskeletal:        General: No tenderness or deformity. Normal range of motion.     Cervical back: Normal range of motion.  Lymphadenopathy:     Cervical: No cervical adenopathy.  Skin:    General: Skin is warm and dry.     Findings: No erythema or rash.  Neurological:     Mental Status: He is alert and oriented to person, place, and time.  Psychiatric:        Behavior: Behavior normal.        Thought Content: Thought content normal.        Judgment: Judgment normal.      Lab Results  Component Value Date   WBC 5.7 09/24/2020   HGB 13.9 09/24/2020   HCT 44.0 09/24/2020   MCV 84.1 09/24/2020   PLT 229 09/24/2020     Chemistry      Component Value Date/Time   NA 140 09/24/2020 0848   K 3.8 09/24/2020 0848   CL 101 09/24/2020 0848   CO2 30 09/24/2020 0848   BUN 23 09/24/2020 0848   CREATININE 1.69 (H) 09/24/2020 0848      Component Value Date/Time   CALCIUM 9.8 09/24/2020 0848   ALKPHOS 62 09/24/2020 0848   AST 10 (L) 09/24/2020 0848   ALT 8 09/24/2020 0848   BILITOT 0.9 09/24/2020 0848      Impression and Plan: Mr. Belmontes is a very nice 74 year old white male.  He has secondary polycythemia.  He is on testosterone injections which he needs.  I told him that he can continue to donate blood.  His blood is perfect.  There is nothing wrong with his blood.  He has O+ blood which can definitely be used in the community.  We will plan to get him back in 6 months.  He  will continue to donate blood.   Volanda Napoleon, MD 12/20/20219:47 AM

## 2020-10-04 ENCOUNTER — Telehealth: Payer: Self-pay | Admitting: Allergy

## 2020-10-04 NOTE — Telephone Encounter (Signed)
[name] called in and has been receiving samples of Xhance.  He would like his prescription called in the [facility] in [place].  I did not realize that [name] had to go through a specialty pharmacy.  [name] would like to know how much this will cost him through his insurance before he picks up more samples.  Please advise.

## 2020-10-08 NOTE — Telephone Encounter (Signed)
Patient informed of Medicare and how they do the copay with the medication even when they say they authorized the coverage the copay can still be high. Patient is okay with picking up samples for now.

## 2020-10-12 ENCOUNTER — Other Ambulatory Visit: Payer: Self-pay

## 2020-10-12 ENCOUNTER — Encounter (INDEPENDENT_AMBULATORY_CARE_PROVIDER_SITE_OTHER): Payer: Medicare Other | Admitting: Ophthalmology

## 2020-10-12 DIAGNOSIS — I10 Essential (primary) hypertension: Secondary | ICD-10-CM | POA: Diagnosis not present

## 2020-10-12 DIAGNOSIS — H35033 Hypertensive retinopathy, bilateral: Secondary | ICD-10-CM | POA: Diagnosis not present

## 2020-10-12 DIAGNOSIS — H43813 Vitreous degeneration, bilateral: Secondary | ICD-10-CM

## 2020-10-12 DIAGNOSIS — H353111 Nonexudative age-related macular degeneration, right eye, early dry stage: Secondary | ICD-10-CM

## 2020-10-12 DIAGNOSIS — H353221 Exudative age-related macular degeneration, left eye, with active choroidal neovascularization: Secondary | ICD-10-CM | POA: Diagnosis not present

## 2020-10-12 DIAGNOSIS — D3132 Benign neoplasm of left choroid: Secondary | ICD-10-CM

## 2020-10-16 DIAGNOSIS — Z8719 Personal history of other diseases of the digestive system: Secondary | ICD-10-CM | POA: Diagnosis not present

## 2020-10-16 DIAGNOSIS — I129 Hypertensive chronic kidney disease with stage 1 through stage 4 chronic kidney disease, or unspecified chronic kidney disease: Secondary | ICD-10-CM | POA: Diagnosis not present

## 2020-10-16 DIAGNOSIS — N2581 Secondary hyperparathyroidism of renal origin: Secondary | ICD-10-CM | POA: Diagnosis not present

## 2020-10-16 DIAGNOSIS — M069 Rheumatoid arthritis, unspecified: Secondary | ICD-10-CM | POA: Diagnosis not present

## 2020-10-16 DIAGNOSIS — Z9049 Acquired absence of other specified parts of digestive tract: Secondary | ICD-10-CM | POA: Diagnosis not present

## 2020-10-16 DIAGNOSIS — N183 Chronic kidney disease, stage 3 unspecified: Secondary | ICD-10-CM | POA: Diagnosis not present

## 2020-10-16 DIAGNOSIS — M519 Unspecified thoracic, thoracolumbar and lumbosacral intervertebral disc disorder: Secondary | ICD-10-CM | POA: Diagnosis not present

## 2020-10-16 DIAGNOSIS — I82402 Acute embolism and thrombosis of unspecified deep veins of left lower extremity: Secondary | ICD-10-CM | POA: Diagnosis not present

## 2020-10-16 DIAGNOSIS — Z9889 Other specified postprocedural states: Secondary | ICD-10-CM | POA: Diagnosis not present

## 2020-10-18 DIAGNOSIS — M961 Postlaminectomy syndrome, not elsewhere classified: Secondary | ICD-10-CM | POA: Diagnosis not present

## 2020-11-09 ENCOUNTER — Other Ambulatory Visit: Payer: Self-pay

## 2020-11-09 ENCOUNTER — Encounter (INDEPENDENT_AMBULATORY_CARE_PROVIDER_SITE_OTHER): Payer: Medicare Other | Admitting: Ophthalmology

## 2020-11-09 ENCOUNTER — Ambulatory Visit: Payer: Medicare Other | Admitting: Sports Medicine

## 2020-11-09 DIAGNOSIS — H353111 Nonexudative age-related macular degeneration, right eye, early dry stage: Secondary | ICD-10-CM | POA: Diagnosis not present

## 2020-11-09 DIAGNOSIS — H353221 Exudative age-related macular degeneration, left eye, with active choroidal neovascularization: Secondary | ICD-10-CM | POA: Diagnosis not present

## 2020-11-09 DIAGNOSIS — D3132 Benign neoplasm of left choroid: Secondary | ICD-10-CM

## 2020-11-09 DIAGNOSIS — H35033 Hypertensive retinopathy, bilateral: Secondary | ICD-10-CM

## 2020-11-09 DIAGNOSIS — H43813 Vitreous degeneration, bilateral: Secondary | ICD-10-CM | POA: Diagnosis not present

## 2020-11-09 DIAGNOSIS — I10 Essential (primary) hypertension: Secondary | ICD-10-CM

## 2020-11-13 ENCOUNTER — Telehealth: Payer: Self-pay

## 2020-11-13 NOTE — Telephone Encounter (Signed)
Called pt to r/s his appt due to dr pe on call, aware of new time  Temica Righetti

## 2020-12-05 ENCOUNTER — Other Ambulatory Visit: Payer: Self-pay

## 2020-12-05 ENCOUNTER — Ambulatory Visit (INDEPENDENT_AMBULATORY_CARE_PROVIDER_SITE_OTHER): Payer: Medicare Other | Admitting: Sports Medicine

## 2020-12-05 ENCOUNTER — Encounter: Payer: Self-pay | Admitting: Sports Medicine

## 2020-12-05 DIAGNOSIS — B351 Tinea unguium: Secondary | ICD-10-CM

## 2020-12-05 DIAGNOSIS — M79675 Pain in left toe(s): Secondary | ICD-10-CM | POA: Diagnosis not present

## 2020-12-05 DIAGNOSIS — I739 Peripheral vascular disease, unspecified: Secondary | ICD-10-CM

## 2020-12-05 DIAGNOSIS — M79674 Pain in right toe(s): Secondary | ICD-10-CM | POA: Diagnosis not present

## 2020-12-05 NOTE — Progress Notes (Signed)
Subjective: [name] is a 75 y.o. male patient seen today in office with complaint of mildly painful thickened and elongated toenails; unable to trim. Reports that the left hallux gets sore and digs in but last nail trim seem to help.  Patient denies any new problems since last encounter or any new medications.  No other pedal complaints at this time.   Patient Active Problem List   Diagnosis Date Noted  . OA (osteoarthritis) of knee 06/17/2013  . Onychomycosis 03/21/2012  . Sinusitis 12/10/2011  . BPH (benign prostatic hypertrophy) with urinary obstruction 09/01/2011  . Arthralgia 09/01/2011  . SHOULDER PAIN, LEFT 07/23/2010  . NOCTURIA 07/23/2010  . IBS 05/28/2010  . UNSPECIFIED TESTICULAR DYSFUNCTION 04/11/2010  . UNSPECIFIED VITAMIN D DEFICIENCY 04/11/2010  . MYOSITIS 04/02/2010  . ARTHRITIS, CERVICAL SPINE 02/12/2010  . OSTEOPENIA 11/27/2009  . OSTEOARTHRITIS, ANKLES, BILATERAL 05/31/2009  . ANXIETY 03/13/2009  . BACK PAIN, CHRONIC 09/14/2008  . URETERAL CALCULUS, HX OF 05/30/2008  . ASTHMA 12/15/2007  . INSOMNIA 12/15/2007  . GERD 09/28/2007  . HYPOTHYROIDISM 09/07/2007  . HYPERLIPIDEMIA 09/07/2007  . UNS ADVRS EFF OTH RX MEDICINAL&BIOLOGICAL SBSTNC 09/07/2007    Current Outpatient Medications on File Prior to Visit  Medication Sig Dispense Refill  . albuterol (PROAIR HFA) 108 (90 Base) MCG/ACT inhaler Can inhale two puffs every 4-6 hours if needed for cough or wheeze. 8.5 g 1  . allopurinol (ZYLOPRIM) 100 MG tablet Take 100 mg by mouth 2 (two) times daily.     Marland Kitchen amLODipine (NORVASC) 2.5 MG tablet Take 2.5 mg by mouth daily.    Marland Kitchen azelastine (ASTELIN) 0.1 % nasal spray Use two sprays in each nostril twice daily as directed for nasal drainage. 30 mL 5  . Cholecalciferol (VITAMIN D3 PO) Take by mouth daily.    Marland Kitchen doxazosin (CARDURA) 2 MG tablet Take 2 mg by mouth every morning.    . fluticasone (FLONASE) 50 MCG/ACT nasal spray Can use two sprays in each nostril once  daily as directed for nasal congestion. 16 g 5  . furosemide (LASIX) 40 MG tablet Take 40 mg by mouth daily.  3  . HYDROcodone-acetaminophen (NORCO) 10-325 MG tablet Take 1 tablet by mouth 3 (three) times daily as needed.  0  . levocetirizine (XYZAL) 5 MG tablet Can take one tablet by mouth once daily as directed. 30 tablet 5  . methocarbamol (ROBAXIN) 500 MG tablet Take 500 mg by mouth 3 (three) times daily as needed.    Marland Kitchen ofloxacin (OCUFLOX) 0.3 % ophthalmic solution     . omeprazole (PRILOSEC) 20 MG capsule Take 20 mg by mouth 2 (two) times daily.    . potassium citrate (UROCIT-K) 10 MEQ (1080 MG) SR tablet Take 10 mEq by mouth 2 (two) times daily.    . predniSONE (DELTASONE) 5 MG tablet Take 5 mg by mouth 2 (two) times daily.    . Testosterone Cypionate 200 MG/ML SOLN testosterone cypionate 200 mg/mL intramuscular oil  inject 0.5 milliliter intramuscularly every week     No current facility-administered medications on file prior to visit.    Allergies  Allergen Reactions  . Caffeine Other (See Comments)    Urinary retention and jittery  . Codeine Other (See Comments)    Jittery  . Iodinated Diagnostic Agents Other (See Comments)    Hard on kidneys. The blue dye that went through my IV.   Marland Kitchen Pseudoephedrine Other (See Comments)    Urinary retention  . Simvastatin Other (See Comments)  Hurt my bones  . Penicillins Itching    Objective: Physical Exam  General: Well developed, nourished, no acute distress, awake, alert and oriented x 3  Vascular: Dorsalis pedis artery 1/4 bilateral, Posterior tibial artery 0/4 bilateral, skin temperature warm to warm proximal to distal bilateral lower extremities, no varicosities, scant pedal hair present bilateral.  Neurological: Gross sensation present via light touch bilateral.   Dermatological: Skin is warm, dry, and supple bilateral, Nails 1-10 are tender, long, thick, and discolored with mild subungal debris and incurvation at left  hallux with no infection, no webspace macerations present bilateral, no open lesions present bilateral, no callus/corns/hyperkeratotic tissue present bilateral. No signs of infection bilateral.  Musculoskeletal: Asymptomatic bunion and hammertoe boney deformities noted bilateral. Muscular strength within normal limits without painon range of motion. No pain with calf compression bilateral.  Assessment and Plan:  Problem List Items Addressed This Visit   None   Visit Diagnoses    Pain due to onychomycosis of toenails of both feet    -  Primary   PVD (peripheral vascular disease) ([place])          -Examined patient.  -Mechanically debrided and reduced mycotic nails with sterile nail nipper and dremel nail file without incident. -Patient to return in 3 months for follow up evaluation or sooner if symptoms worsen.  Landis Martins, DPM

## 2020-12-07 ENCOUNTER — Ambulatory Visit: Payer: Medicare Other | Admitting: Sports Medicine

## 2020-12-07 ENCOUNTER — Encounter (INDEPENDENT_AMBULATORY_CARE_PROVIDER_SITE_OTHER): Payer: Medicare Other | Admitting: Ophthalmology

## 2020-12-07 ENCOUNTER — Other Ambulatory Visit: Payer: Self-pay

## 2020-12-07 DIAGNOSIS — H353112 Nonexudative age-related macular degeneration, right eye, intermediate dry stage: Secondary | ICD-10-CM | POA: Diagnosis not present

## 2020-12-07 DIAGNOSIS — I10 Essential (primary) hypertension: Secondary | ICD-10-CM | POA: Diagnosis not present

## 2020-12-07 DIAGNOSIS — D3132 Benign neoplasm of left choroid: Secondary | ICD-10-CM | POA: Diagnosis not present

## 2020-12-07 DIAGNOSIS — H353221 Exudative age-related macular degeneration, left eye, with active choroidal neovascularization: Secondary | ICD-10-CM | POA: Diagnosis not present

## 2020-12-07 DIAGNOSIS — H43813 Vitreous degeneration, bilateral: Secondary | ICD-10-CM

## 2020-12-07 DIAGNOSIS — H35033 Hypertensive retinopathy, bilateral: Secondary | ICD-10-CM | POA: Diagnosis not present

## 2020-12-12 DIAGNOSIS — M5136 Other intervertebral disc degeneration, lumbar region: Secondary | ICD-10-CM | POA: Diagnosis not present

## 2020-12-12 DIAGNOSIS — M150 Primary generalized (osteo)arthritis: Secondary | ICD-10-CM | POA: Diagnosis not present

## 2020-12-12 DIAGNOSIS — M1A09X1 Idiopathic chronic gout, multiple sites, with tophus (tophi): Secondary | ICD-10-CM | POA: Diagnosis not present

## 2020-12-12 DIAGNOSIS — E669 Obesity, unspecified: Secondary | ICD-10-CM | POA: Diagnosis not present

## 2020-12-12 DIAGNOSIS — M0579 Rheumatoid arthritis with rheumatoid factor of multiple sites without organ or systems involvement: Secondary | ICD-10-CM | POA: Diagnosis not present

## 2020-12-12 DIAGNOSIS — N1832 Chronic kidney disease, stage 3b: Secondary | ICD-10-CM | POA: Diagnosis not present

## 2020-12-12 DIAGNOSIS — Z6830 Body mass index (BMI) 30.0-30.9, adult: Secondary | ICD-10-CM | POA: Diagnosis not present

## 2020-12-27 DIAGNOSIS — M47816 Spondylosis without myelopathy or radiculopathy, lumbar region: Secondary | ICD-10-CM | POA: Diagnosis not present

## 2021-01-11 ENCOUNTER — Other Ambulatory Visit: Payer: Self-pay

## 2021-01-11 ENCOUNTER — Encounter (INDEPENDENT_AMBULATORY_CARE_PROVIDER_SITE_OTHER): Payer: Medicare Other | Admitting: Ophthalmology

## 2021-01-11 DIAGNOSIS — D3132 Benign neoplasm of left choroid: Secondary | ICD-10-CM | POA: Diagnosis not present

## 2021-01-11 DIAGNOSIS — H35033 Hypertensive retinopathy, bilateral: Secondary | ICD-10-CM

## 2021-01-11 DIAGNOSIS — H43813 Vitreous degeneration, bilateral: Secondary | ICD-10-CM

## 2021-01-11 DIAGNOSIS — H353112 Nonexudative age-related macular degeneration, right eye, intermediate dry stage: Secondary | ICD-10-CM | POA: Diagnosis not present

## 2021-01-11 DIAGNOSIS — I10 Essential (primary) hypertension: Secondary | ICD-10-CM | POA: Diagnosis not present

## 2021-01-11 DIAGNOSIS — H353221 Exudative age-related macular degeneration, left eye, with active choroidal neovascularization: Secondary | ICD-10-CM | POA: Diagnosis not present

## 2021-01-17 DIAGNOSIS — M47812 Spondylosis without myelopathy or radiculopathy, cervical region: Secondary | ICD-10-CM | POA: Diagnosis not present

## 2021-01-17 DIAGNOSIS — Z79891 Long term (current) use of opiate analgesic: Secondary | ICD-10-CM | POA: Diagnosis not present

## 2021-01-22 ENCOUNTER — Ambulatory Visit (INDEPENDENT_AMBULATORY_CARE_PROVIDER_SITE_OTHER): Payer: Medicare Other | Admitting: Allergy

## 2021-01-22 ENCOUNTER — Other Ambulatory Visit: Payer: Self-pay

## 2021-01-22 ENCOUNTER — Encounter: Payer: Self-pay | Admitting: Allergy

## 2021-01-22 VITALS — BP 122/70 | HR 80 | Resp 16

## 2021-01-22 DIAGNOSIS — R062 Wheezing: Secondary | ICD-10-CM | POA: Diagnosis not present

## 2021-01-22 DIAGNOSIS — J310 Chronic rhinitis: Secondary | ICD-10-CM

## 2021-01-22 DIAGNOSIS — H9213 Otorrhea, bilateral: Secondary | ICD-10-CM | POA: Diagnosis not present

## 2021-01-22 MED ORDER — VENTOLIN HFA 108 (90 BASE) MCG/ACT IN AERS: INHALATION_SPRAY | RESPIRATORY_TRACT | 1 refills | Status: DC

## 2021-01-22 NOTE — Progress Notes (Signed)
Follow-up Note  RE: [name] MRN: [id number] DOB: 03/16/1946 Date of Office Visit: 01/22/2021   History of present illness: [name] is a 75 y.o. male presenting today for follow-up of nonallergic rhinitis and wheezing.  He was last seen in the office on 05/22/2020 by our nurse practitioner Quita Skye.  He states he has been doing well since his last visit without any major health changes, surgeries or hospitalizations. He states the [place] really works well when he needs to use it to relieve sinus pressure symptoms.  He does not have any more at this time.  He states when he is feeling sinus pressure and congestion he will use the Xhance and within 30 minutes or so that pressure has been relieved.  He states he does continue to take levocetirizine daily.  He is out of the nasal azelastine but states it does help to decrease his nasal drainage when he does use it.  He still reports some nasal drainage in the mornings that he blows out when he gets up. He also states his ear still draining a bit and are quite itchy He states with pollen he is noticing a bit more wheeze symptoms.  He may use his albuterol 1-2 times a week on average.  He states the ProAir that was prescribed at last visit seems to make him feel a bit dizzy.  The Ventolin albuterol did not do this because he would like Ventolin filled.  Review of systems: Review of Systems  Constitutional: Negative.   HENT:       See HPI  Eyes: Negative.   Respiratory:       See HPI  Cardiovascular: Negative.   Gastrointestinal: Negative.   Musculoskeletal: Negative.   Skin: Negative.   Neurological: Negative.     All other systems negative unless noted above in HPI  Past medical/social/surgical/family history have been reviewed and are unchanged unless specifically indicated below.  No changes  Medication List: Current Outpatient Medications  Medication Sig Dispense Refill  . allopurinol (ZYLOPRIM) 100 MG tablet Take 100  mg by mouth 2 (two) times daily.     Marland Kitchen amLODipine (NORVASC) 2.5 MG tablet Take 2.5 mg by mouth daily.    Marland Kitchen azelastine (ASTELIN) 0.1 % nasal spray Use two sprays in each nostril twice daily as directed for nasal drainage. 30 mL 5  . Cholecalciferol (VITAMIN D3 PO) Take by mouth daily.    Marland Kitchen doxazosin (CARDURA) 2 MG tablet Take 2 mg by mouth every morning.    . Fluticasone Propionate (XHANCE) 93 MCG/ACT EXHU Place into the nose.    . furosemide (LASIX) 40 MG tablet Take 40 mg by mouth daily.  3  . HYDROcodone-acetaminophen (NORCO) 10-325 MG tablet Take 1 tablet by mouth 3 (three) times daily as needed.  0  . levocetirizine (XYZAL) 5 MG tablet Can take one tablet by mouth once daily as directed. 30 tablet 5  . methocarbamol (ROBAXIN) 500 MG tablet Take 500 mg by mouth 3 (three) times daily as needed.    Marland Kitchen ofloxacin (OCUFLOX) 0.3 % ophthalmic solution     . omeprazole (PRILOSEC) 20 MG capsule Take 20 mg by mouth 2 (two) times daily.    . potassium citrate (UROCIT-K) 10 MEQ (1080 MG) SR tablet Take 10 mEq by mouth 2 (two) times daily.    . Testosterone Cypionate 200 MG/ML SOLN testosterone cypionate 200 mg/mL intramuscular oil  inject 0.5 milliliter intramuscularly every week     No current  facility-administered medications for this visit.     Known medication allergies: Allergies  Allergen Reactions  . Caffeine Other (See Comments)    Urinary retention and jittery  . Codeine Other (See Comments)    Jittery  . Iodinated Diagnostic Agents Other (See Comments)    Hard on kidneys. The blue dye that went through my IV.   Marland Kitchen Pseudoephedrine Other (See Comments)    Urinary retention  . Simvastatin Other (See Comments)    Hurt my bones  . Penicillins Itching     Physical examination: Blood pressure 122/70, pulse 80, resp. rate 16, SpO2 94 %.  General: Alert, interactive, in no acute distress. HEENT: PERRLA, TMs pearly gray, canal with thin cerumen, turbinates moderately edematous without  discharge, post-pharynx non erythematous. Neck: Supple without lymphadenopathy. Lungs: Clear to auscultation without wheezing, rhonchi or rales. {no increased work of breathing. CV: Normal S1, S2 without murmurs. Abdomen: Nondistended, nontender. Skin: Warm and dry, without lesions or rashes. Extremities:  No clubbing, cyanosis or edema. Neuro:   Grossly intact.  Diagnositics/Labs:  Spirometry: FEV1: 1.59L 54%, FVC: 2.69L 67% predicted.  This is stable   Assessment and plan:   Rhinitis, non-allergic    - environmental allergy panel obtained in 2019 was negative       - continue xyzal 5mg  daily at this time    -Continue use of Xhance 2 sprays twice a day as needed for nasal congestion control.  Will provide with sample this week.    - for nasal drainage/runny nose recommend use of nasal antihistamine, Astelin, 2 sprays each nostril twice a day   Wheezing   - have access to Ventolin (albuterol) inhaler 2 puffs every 4-6 hours as needed for cough/wheeze/shortness of breath/chest tightness.  May use 15-20 minutes prior to activity.   Monitor frequency of use.     - use inhaler with spacer chamber   Asthma control goals:   Full participation in all desired activities (may need albuterol before activity)  Albuterol use two time or less a week on average (not counting use with activity)  Cough interfering with sleep two time or less a month  Oral steroids no more than once a year  No hospitalizations  Ear drainage - most likely thin wax is draining  Follow-up 6 months or sooner if needed  I appreciate the opportunity to take part in [name]'s care. Please do not hesitate to contact me with questions.  Sincerely,   Prudy Feeler, MD Allergy/Immunology [facility]

## 2021-01-22 NOTE — Patient Instructions (Addendum)
Rhinitis, non-allergic    - environmental allergy panel obtained in 2019 was negative       - continue xyzal 5mg  daily at this time    -Continue use of Xhance 2 sprays twice a day as needed for nasal congestion control.  Will provide with sample this week.    - for nasal drainage/runny nose recommend use of nasal antihistamine, Astelin, 2 sprays each nostril twice a day   Wheezing   - have access to Ventolin (albuterol) inhaler 2 puffs every 4-6 hours as needed for cough/wheeze/shortness of breath/chest tightness.  May use 15-20 minutes prior to activity.   Monitor frequency of use.     - use inhaler with spacer chamber   Asthma control goals:   Full participation in all desired activities (may need albuterol before activity)  Albuterol use two time or less a week on average (not counting use with activity)  Cough interfering with sleep two time or less a month  Oral steroids no more than once a year  No hospitalizations  Ear drainage - most likely thin wax is draining  Follow-up 6 months or sooner if needed

## 2021-01-23 NOTE — Progress Notes (Signed)
We received samples from [facility] location and patient was informed. He would come by tomorrow to pick them up.

## 2021-02-11 DIAGNOSIS — E291 Testicular hypofunction: Secondary | ICD-10-CM | POA: Diagnosis not present

## 2021-02-11 DIAGNOSIS — R972 Elevated prostate specific antigen [PSA]: Secondary | ICD-10-CM | POA: Diagnosis not present

## 2021-02-11 DIAGNOSIS — N183 Chronic kidney disease, stage 3 unspecified: Secondary | ICD-10-CM | POA: Diagnosis not present

## 2021-02-12 DIAGNOSIS — E785 Hyperlipidemia, unspecified: Secondary | ICD-10-CM | POA: Diagnosis not present

## 2021-02-12 DIAGNOSIS — E559 Vitamin D deficiency, unspecified: Secondary | ICD-10-CM | POA: Diagnosis not present

## 2021-02-13 ENCOUNTER — Encounter: Payer: Self-pay | Admitting: Sports Medicine

## 2021-02-13 ENCOUNTER — Other Ambulatory Visit: Payer: Self-pay

## 2021-02-13 ENCOUNTER — Ambulatory Visit (INDEPENDENT_AMBULATORY_CARE_PROVIDER_SITE_OTHER): Payer: Medicare Other | Admitting: Sports Medicine

## 2021-02-13 DIAGNOSIS — M79675 Pain in left toe(s): Secondary | ICD-10-CM

## 2021-02-13 DIAGNOSIS — I739 Peripheral vascular disease, unspecified: Secondary | ICD-10-CM | POA: Diagnosis not present

## 2021-02-13 DIAGNOSIS — B351 Tinea unguium: Secondary | ICD-10-CM

## 2021-02-13 DIAGNOSIS — M79674 Pain in right toe(s): Secondary | ICD-10-CM | POA: Diagnosis not present

## 2021-02-13 NOTE — Progress Notes (Signed)
Subjective: [name] is a 75 y.o. male patient seen today in office with complaint of mildly painful thickened and elongated toenails; unable to trim. No other pedal complaints at this time.   Patient Active Problem List   Diagnosis Date Noted  . OA (osteoarthritis) of knee 06/17/2013  . Onychomycosis 03/21/2012  . Sinusitis 12/10/2011  . BPH (benign prostatic hypertrophy) with urinary obstruction 09/01/2011  . Arthralgia 09/01/2011  . SHOULDER PAIN, LEFT 07/23/2010  . NOCTURIA 07/23/2010  . IBS 05/28/2010  . UNSPECIFIED TESTICULAR DYSFUNCTION 04/11/2010  . UNSPECIFIED VITAMIN D DEFICIENCY 04/11/2010  . MYOSITIS 04/02/2010  . ARTHRITIS, CERVICAL SPINE 02/12/2010  . OSTEOPENIA 11/27/2009  . OSTEOARTHRITIS, ANKLES, BILATERAL 05/31/2009  . ANXIETY 03/13/2009  . BACK PAIN, CHRONIC 09/14/2008  . URETERAL CALCULUS, HX OF 05/30/2008  . ASTHMA 12/15/2007  . INSOMNIA 12/15/2007  . GERD 09/28/2007  . HYPOTHYROIDISM 09/07/2007  . HYPERLIPIDEMIA 09/07/2007  . UNS ADVRS EFF OTH RX MEDICINAL&BIOLOGICAL SBSTNC 09/07/2007    Current Outpatient Medications on File Prior to Visit  Medication Sig Dispense Refill  . allopurinol (ZYLOPRIM) 100 MG tablet Take 100 mg by mouth 2 (two) times daily.     Marland Kitchen amLODipine (NORVASC) 2.5 MG tablet Take 2.5 mg by mouth daily.    Marland Kitchen azelastine (ASTELIN) 0.1 % nasal spray Use two sprays in each nostril twice daily as directed for nasal drainage. 30 mL 5  . Cholecalciferol (VITAMIN D3 PO) Take by mouth daily.    Marland Kitchen doxazosin (CARDURA) 2 MG tablet Take 2 mg by mouth every morning.    . Fluticasone Propionate (XHANCE) 93 MCG/ACT EXHU Place into the nose.    . furosemide (LASIX) 40 MG tablet Take 40 mg by mouth daily.  3  . HYDROcodone-acetaminophen (NORCO) 10-325 MG tablet Take 1 tablet by mouth 3 (three) times daily as needed.  0  . levocetirizine (XYZAL) 5 MG tablet Can take one tablet by mouth once daily as directed. 30 tablet 5  . methocarbamol (ROBAXIN)  500 MG tablet Take 500 mg by mouth 3 (three) times daily as needed.    Marland Kitchen ofloxacin (OCUFLOX) 0.3 % ophthalmic solution     . omeprazole (PRILOSEC) 20 MG capsule Take 20 mg by mouth 2 (two) times daily.    . potassium citrate (UROCIT-K) 10 MEQ (1080 MG) SR tablet Take 10 mEq by mouth 2 (two) times daily.    . Testosterone Cypionate 200 MG/ML SOLN testosterone cypionate 200 mg/mL intramuscular oil  inject 0.5 milliliter intramuscularly every week    . VENTOLIN HFA 108 (90 Base) MCG/ACT inhaler Inhale two puffs every 4-6 hours if needed for cough or wheeze. 1 each 1   No current facility-administered medications on file prior to visit.    Allergies  Allergen Reactions  . Caffeine Other (See Comments)    Urinary retention and jittery  . Codeine Other (See Comments)    Jittery  . Iodinated Diagnostic Agents Other (See Comments)    Hard on kidneys. The blue dye that went through my IV.   Marland Kitchen Pseudoephedrine Other (See Comments)    Urinary retention  . Simvastatin Other (See Comments)    Hurt my bones  . Penicillins Itching    Objective: Physical Exam  General: Well developed, nourished, no acute distress, awake, alert and oriented x 3  Vascular: Dorsalis pedis artery 1/4 bilateral, Posterior tibial artery 0/4 bilateral, skin temperature warm to warm proximal to distal bilateral lower extremities, no varicosities, scant pedal hair present bilateral.  Neurological: Gross  sensation present via light touch bilateral.   Dermatological: Skin is warm, dry, and supple bilateral, Nails 1-10 are tender, long, thick, and discolored with mild subungal debris and incurvation at left hallux with no infection, no webspace macerations present bilateral, no open lesions present bilateral, no callus/corns/hyperkeratotic tissue present bilateral. No signs of infection bilateral.  Musculoskeletal: Asymptomatic bunion and hammertoe boney deformities noted bilateral. Muscular strength within normal limits  without painon range of motion. No pain with calf compression bilateral.  Assessment and Plan:  Problem List Items Addressed This Visit   None   Visit Diagnoses    Pain due to onychomycosis of toenails of both feet    -  Primary   PVD (peripheral vascular disease) ([place])          -Examined patient.  -Mechanically debrided and reduced mycotic nails with sterile nail nipper and dremel nail file without incident. -Patient to return as scheduled for follow up evaluation or sooner if symptoms worsen.  Advised patient if any ingrown nails keep bothering him may benefit from a permanent nail avulsion procedure in the future.  At this time we will adjust his nail trim schedule to have him come in 2-1/2 months instead of 3 meanwhile may have wife use a nail file to help decrease nails from growing out so long in between visits.  Landis Martins, DPM

## 2021-02-15 ENCOUNTER — Other Ambulatory Visit: Payer: Self-pay

## 2021-02-15 ENCOUNTER — Encounter (INDEPENDENT_AMBULATORY_CARE_PROVIDER_SITE_OTHER): Payer: Medicare Other | Admitting: Ophthalmology

## 2021-02-15 DIAGNOSIS — H353112 Nonexudative age-related macular degeneration, right eye, intermediate dry stage: Secondary | ICD-10-CM | POA: Diagnosis not present

## 2021-02-15 DIAGNOSIS — H353221 Exudative age-related macular degeneration, left eye, with active choroidal neovascularization: Secondary | ICD-10-CM

## 2021-02-15 DIAGNOSIS — I10 Essential (primary) hypertension: Secondary | ICD-10-CM

## 2021-02-15 DIAGNOSIS — H43813 Vitreous degeneration, bilateral: Secondary | ICD-10-CM

## 2021-02-15 DIAGNOSIS — H35033 Hypertensive retinopathy, bilateral: Secondary | ICD-10-CM | POA: Diagnosis not present

## 2021-02-15 DIAGNOSIS — D3132 Benign neoplasm of left choroid: Secondary | ICD-10-CM | POA: Diagnosis not present

## 2021-02-20 DIAGNOSIS — Z0000 Encounter for general adult medical examination without abnormal findings: Secondary | ICD-10-CM | POA: Diagnosis not present

## 2021-02-20 DIAGNOSIS — I129 Hypertensive chronic kidney disease with stage 1 through stage 4 chronic kidney disease, or unspecified chronic kidney disease: Secondary | ICD-10-CM | POA: Diagnosis not present

## 2021-02-20 DIAGNOSIS — K219 Gastro-esophageal reflux disease without esophagitis: Secondary | ICD-10-CM | POA: Diagnosis not present

## 2021-02-20 DIAGNOSIS — E291 Testicular hypofunction: Secondary | ICD-10-CM | POA: Diagnosis not present

## 2021-02-20 DIAGNOSIS — J45909 Unspecified asthma, uncomplicated: Secondary | ICD-10-CM | POA: Diagnosis not present

## 2021-02-20 DIAGNOSIS — N1832 Chronic kidney disease, stage 3b: Secondary | ICD-10-CM | POA: Diagnosis not present

## 2021-02-20 DIAGNOSIS — M5380 Other specified dorsopathies, site unspecified: Secondary | ICD-10-CM | POA: Diagnosis not present

## 2021-02-20 DIAGNOSIS — Z1212 Encounter for screening for malignant neoplasm of rectum: Secondary | ICD-10-CM | POA: Diagnosis not present

## 2021-02-20 DIAGNOSIS — D751 Secondary polycythemia: Secondary | ICD-10-CM | POA: Diagnosis not present

## 2021-02-20 DIAGNOSIS — M1990 Unspecified osteoarthritis, unspecified site: Secondary | ICD-10-CM | POA: Diagnosis not present

## 2021-02-20 DIAGNOSIS — R82998 Other abnormal findings in urine: Secondary | ICD-10-CM | POA: Diagnosis not present

## 2021-02-20 DIAGNOSIS — E669 Obesity, unspecified: Secondary | ICD-10-CM | POA: Diagnosis not present

## 2021-02-20 DIAGNOSIS — E785 Hyperlipidemia, unspecified: Secondary | ICD-10-CM | POA: Diagnosis not present

## 2021-02-20 DIAGNOSIS — M069 Rheumatoid arthritis, unspecified: Secondary | ICD-10-CM | POA: Diagnosis not present

## 2021-03-21 ENCOUNTER — Telehealth: Payer: Self-pay | Admitting: *Deleted

## 2021-03-21 NOTE — Telephone Encounter (Signed)
Call received from patient stating that he is at [facility] for a phlebotomy and there is no active order available.  New prescription faxed to [facility] and patient notified.

## 2021-03-25 ENCOUNTER — Encounter (INDEPENDENT_AMBULATORY_CARE_PROVIDER_SITE_OTHER): Payer: Medicare Other | Admitting: Ophthalmology

## 2021-03-25 ENCOUNTER — Ambulatory Visit: Payer: Medicare Other | Admitting: Hematology & Oncology

## 2021-03-25 ENCOUNTER — Other Ambulatory Visit: Payer: Medicare Other

## 2021-03-25 ENCOUNTER — Other Ambulatory Visit: Payer: Self-pay

## 2021-03-25 DIAGNOSIS — H2513 Age-related nuclear cataract, bilateral: Secondary | ICD-10-CM

## 2021-03-25 DIAGNOSIS — H353221 Exudative age-related macular degeneration, left eye, with active choroidal neovascularization: Secondary | ICD-10-CM | POA: Diagnosis not present

## 2021-03-25 DIAGNOSIS — H353112 Nonexudative age-related macular degeneration, right eye, intermediate dry stage: Secondary | ICD-10-CM | POA: Diagnosis not present

## 2021-03-25 DIAGNOSIS — H35033 Hypertensive retinopathy, bilateral: Secondary | ICD-10-CM | POA: Diagnosis not present

## 2021-03-25 DIAGNOSIS — H43813 Vitreous degeneration, bilateral: Secondary | ICD-10-CM | POA: Diagnosis not present

## 2021-03-25 DIAGNOSIS — I10 Essential (primary) hypertension: Secondary | ICD-10-CM

## 2021-03-25 DIAGNOSIS — D3132 Benign neoplasm of left choroid: Secondary | ICD-10-CM | POA: Diagnosis not present

## 2021-03-27 ENCOUNTER — Encounter: Payer: Self-pay | Admitting: Hematology & Oncology

## 2021-03-27 ENCOUNTER — Telehealth: Payer: Self-pay

## 2021-03-27 ENCOUNTER — Other Ambulatory Visit: Payer: Self-pay

## 2021-03-27 ENCOUNTER — Inpatient Hospital Stay (HOSPITAL_BASED_OUTPATIENT_CLINIC_OR_DEPARTMENT_OTHER): Payer: Medicare Other | Admitting: Hematology & Oncology

## 2021-03-27 ENCOUNTER — Inpatient Hospital Stay: Payer: Medicare Other | Attending: Hematology & Oncology

## 2021-03-27 VITALS — BP 124/57 | HR 86 | Temp 98.9°F | Resp 20 | Wt 220.8 lb

## 2021-03-27 DIAGNOSIS — K2100 Gastro-esophageal reflux disease with esophagitis, without bleeding: Secondary | ICD-10-CM | POA: Diagnosis not present

## 2021-03-27 DIAGNOSIS — D649 Anemia, unspecified: Secondary | ICD-10-CM | POA: Insufficient documentation

## 2021-03-27 DIAGNOSIS — R42 Dizziness and giddiness: Secondary | ICD-10-CM | POA: Diagnosis not present

## 2021-03-27 DIAGNOSIS — D751 Secondary polycythemia: Secondary | ICD-10-CM | POA: Insufficient documentation

## 2021-03-27 DIAGNOSIS — Z79899 Other long term (current) drug therapy: Secondary | ICD-10-CM | POA: Diagnosis not present

## 2021-03-27 DIAGNOSIS — F5101 Primary insomnia: Secondary | ICD-10-CM

## 2021-03-27 DIAGNOSIS — R059 Cough, unspecified: Secondary | ICD-10-CM | POA: Diagnosis not present

## 2021-03-27 DIAGNOSIS — D500 Iron deficiency anemia secondary to blood loss (chronic): Secondary | ICD-10-CM

## 2021-03-27 LAB — CBC WITH DIFFERENTIAL (CANCER CENTER ONLY)
Abs Immature Granulocytes: 0.03 10*3/uL (ref 0.00–0.07)
Basophils Absolute: 0.1 10*3/uL (ref 0.0–0.1)
Basophils Relative: 1 %
Eosinophils Absolute: 0.1 10*3/uL (ref 0.0–0.5)
Eosinophils Relative: 2 %
HCT: 40.6 % (ref 39.0–52.0)
Hemoglobin: 12.7 g/dL — ABNORMAL LOW (ref 13.0–17.0)
Immature Granulocytes: 1 %
Lymphocytes Relative: 34 %
Lymphs Abs: 2.2 10*3/uL (ref 0.7–4.0)
MCH: 26.7 pg (ref 26.0–34.0)
MCHC: 31.3 g/dL (ref 30.0–36.0)
MCV: 85.5 fL (ref 80.0–100.0)
Monocytes Absolute: 0.6 10*3/uL (ref 0.1–1.0)
Monocytes Relative: 10 %
Neutro Abs: 3.5 10*3/uL (ref 1.7–7.7)
Neutrophils Relative %: 52 %
Platelet Count: 223 10*3/uL (ref 150–400)
RBC: 4.75 MIL/uL (ref 4.22–5.81)
RDW: 14.1 % (ref 11.5–15.5)
WBC Count: 6.6 10*3/uL (ref 4.0–10.5)
nRBC: 0 % (ref 0.0–0.2)

## 2021-03-27 LAB — CMP (CANCER CENTER ONLY)
ALT: 10 U/L (ref 0–44)
AST: 13 U/L — ABNORMAL LOW (ref 15–41)
Albumin: 4.1 g/dL (ref 3.5–5.0)
Alkaline Phosphatase: 64 U/L (ref 38–126)
Anion gap: 8 (ref 5–15)
BUN: 25 mg/dL — ABNORMAL HIGH (ref 8–23)
CO2: 31 mmol/L (ref 22–32)
Calcium: 9.9 mg/dL (ref 8.9–10.3)
Chloride: 102 mmol/L (ref 98–111)
Creatinine: 1.9 mg/dL — ABNORMAL HIGH (ref 0.61–1.24)
GFR, Estimated: 36 mL/min — ABNORMAL LOW (ref >60)
Glucose, Bld: 130 mg/dL — ABNORMAL HIGH (ref 70–99)
Potassium: 4.6 mmol/L (ref 3.5–5.1)
Sodium: 141 mmol/L (ref 135–145)
Total Bilirubin: 0.8 mg/dL (ref 0.3–1.2)
Total Protein: 6.4 g/dL — ABNORMAL LOW (ref 6.5–8.1)

## 2021-03-27 NOTE — Progress Notes (Signed)
Hematology and Oncology Follow Up Visit  [name] [id number] 1945/12/01 75 y.o. 03/27/2021   Principle Diagnosis:  Secondary polycythemia due to testosterone injections  Current Therapy:   Patient donating blood every 2 months.     Interim History:  [name] is back for follow-up.  So far, he is doing pretty well.  He has complained little bit of some dizziness.  He has been donating blood frequently.  He just donated blood a couple weeks ago.  Today, his hemoglobin is 12.7.  His hemoglobin is definitely lowering.  As such, I told him to pull back on the blood donations and try to donate every 4 months.  His last iron studies back in December showed a ferritin of 10 with an iron saturation of 8%.  He is still doing testosterone injections every 2 weeks.  He says this makes him feel a lot better.  He has little bit of a cough.  He has had no wheezing.  He has never smoked.  He has had no fever.  He has had no problems with COVID.  There is been no leg swelling.  He has had no rashes.  Overall, his performance status is ECOG 1.     Medications:  Current Outpatient Medications:    allopurinol (ZYLOPRIM) 100 MG tablet, Take 100 mg by mouth 2 (two) times daily. , Disp: , Rfl:    azelastine (ASTELIN) 0.1 % nasal spray, Use two sprays in each nostril twice daily as directed for nasal drainage., Disp: 30 mL, Rfl: 5   Cholecalciferol (VITAMIN D3 PO), Take by mouth daily., Disp: , Rfl:    doxazosin (CARDURA) 2 MG tablet, Take 2 mg by mouth every morning., Disp: , Rfl:    Fluticasone Propionate (XHANCE) 93 MCG/ACT EXHU, Place into the nose., Disp: , Rfl:    furosemide (LASIX) 40 MG tablet, Take 40 mg by mouth daily., Disp: , Rfl: 3   HYDROcodone-acetaminophen (NORCO) 10-325 MG tablet, Take 1 tablet by mouth 3 (three) times daily as needed., Disp: , Rfl: 0   levocetirizine (XYZAL) 5 MG tablet, Can take one tablet by mouth once daily as directed., Disp: 30 tablet, Rfl: 5    ofloxacin (OCUFLOX) 0.3 % ophthalmic solution, 2 drops. Uses for 3 days after injection., Disp: , Rfl:    omeprazole (PRILOSEC) 20 MG capsule, Take 20 mg by mouth 2 (two) times daily., Disp: , Rfl:    potassium citrate (UROCIT-K) 10 MEQ (1080 MG) SR tablet, Take 10 mEq by mouth 2 (two) times daily., Disp: , Rfl:    Testosterone Cypionate 200 MG/ML SOLN, every 14 (fourteen) days., Disp: , Rfl:    VENTOLIN HFA 108 (90 Base) MCG/ACT inhaler, Inhale two puffs every 4-6 hours if needed for cough or wheeze., Disp: 1 each, Rfl: 1   methocarbamol (ROBAXIN) 500 MG tablet, Take 500 mg by mouth 3 (three) times daily as needed. (Patient not taking: Reported on 03/27/2021), Disp: , Rfl:   Allergies:  Allergies  Allergen Reactions   Caffeine Other (See Comments)    Urinary retention and jittery   Codeine Other (See Comments)    Jittery   Iodinated Diagnostic Agents Other (See Comments)    Hard on kidneys. The blue dye that went through my IV.    Pseudoephedrine Other (See Comments)    Urinary retention   Simvastatin Other (See Comments)    Hurt my bones   Penicillins Itching    Past Medical History, Surgical history, Social history, and Family  History were reviewed and updated.  Review of Systems: Review of Systems  Constitutional: Negative.   HENT:  Negative.    Eyes: Negative.   Respiratory: Negative.    Cardiovascular: Negative.   Gastrointestinal: Negative.   Endocrine: Negative.   Genitourinary: Negative.    Musculoskeletal: Negative.   Skin: Negative.   Neurological: Negative.   Hematological: Negative.   Psychiatric/Behavioral: Negative.     Physical Exam:  weight is 220 lb 12.8 oz (100.2 kg). His oral temperature is 98.9 F (37.2 C). His blood pressure is 124/57 (abnormal) and his pulse is 86. His respiration is 20 and oxygen saturation is 97%.   Wt Readings from Last 3 Encounters:  03/27/21 220 lb 12.8 oz (100.2 kg)  09/24/20 220 lb (99.8 kg)  06/25/20 217 lb (98.4 kg)     Physical Exam Vitals reviewed.  HENT:     Head: Normocephalic and atraumatic.  Eyes:     Pupils: Pupils are equal, round, and reactive to light.  Cardiovascular:     Rate and Rhythm: Normal rate and regular rhythm.     Heart sounds: Normal heart sounds.  Pulmonary:     Effort: Pulmonary effort is normal.     Breath sounds: Normal breath sounds.  Abdominal:     General: Bowel sounds are normal.     Palpations: Abdomen is soft.  Musculoskeletal:        General: No tenderness or deformity. Normal range of motion.     Cervical back: Normal range of motion.  Lymphadenopathy:     Cervical: No cervical adenopathy.  Skin:    General: Skin is warm and dry.     Findings: No erythema or rash.  Neurological:     Mental Status: He is alert and oriented to person, place, and time.  Psychiatric:        Behavior: Behavior normal.        Thought Content: Thought content normal.        Judgment: Judgment normal.     Lab Results  Component Value Date   WBC 6.6 03/27/2021   HGB 12.7 (L) 03/27/2021   HCT 40.6 03/27/2021   MCV 85.5 03/27/2021   PLT 223 03/27/2021     Chemistry      Component Value Date/Time   NA 140 09/24/2020 0848   K 3.8 09/24/2020 0848   CL 101 09/24/2020 0848   CO2 30 09/24/2020 0848   BUN 23 09/24/2020 0848   CREATININE 1.69 (H) 09/24/2020 0848      Component Value Date/Time   CALCIUM 9.8 09/24/2020 0848   ALKPHOS 62 09/24/2020 0848   AST 10 (L) 09/24/2020 0848   ALT 8 09/24/2020 0848   BILITOT 0.9 09/24/2020 0848      Impression and Plan: [name] is a very nice 75 year old white male.  He has secondary polycythemia.  He is on testosterone injections which he needs.  Again, he is donating blood.  This is incredibly important.  His blood is perfect.  He has nothing wrong with his blood that he cannot donate.  Again, I told him to pull back a little bit and donate blood every 4 months.  I think this would be very reasonable.  I will plan to  get him back in another 6 months.  I think this would be perfect.  I am not sure as to why he has this dizziness.  His vital signs look stable.  His heart is regular rate and rhythm.  He  has good blood pressure.  He is mildly anemic which really should not be causing issues.  I told him that if this continues, he can certainly see his family doctor.  Volanda Napoleon, MD 6/22/20221:40 PM

## 2021-03-28 LAB — IRON AND TIBC
Iron: 29 ug/dL — ABNORMAL LOW (ref 42–163)
Saturation Ratios: 7 % — ABNORMAL LOW (ref 20–55)
TIBC: 401 ug/dL (ref 202–409)
UIBC: 372 ug/dL (ref 117–376)

## 2021-03-28 LAB — FERRITIN: Ferritin: 8 ng/mL — ABNORMAL LOW (ref 24–336)

## 2021-04-02 DIAGNOSIS — L570 Actinic keratosis: Secondary | ICD-10-CM | POA: Diagnosis not present

## 2021-04-02 DIAGNOSIS — L821 Other seborrheic keratosis: Secondary | ICD-10-CM | POA: Diagnosis not present

## 2021-04-02 DIAGNOSIS — L578 Other skin changes due to chronic exposure to nonionizing radiation: Secondary | ICD-10-CM | POA: Diagnosis not present

## 2021-04-10 DIAGNOSIS — M25511 Pain in right shoulder: Secondary | ICD-10-CM | POA: Diagnosis not present

## 2021-04-11 ENCOUNTER — Ambulatory Visit: Payer: Medicare Other | Admitting: Cardiology

## 2021-04-12 ENCOUNTER — Other Ambulatory Visit: Payer: Self-pay | Admitting: Allergy

## 2021-04-12 DIAGNOSIS — N401 Enlarged prostate with lower urinary tract symptoms: Secondary | ICD-10-CM | POA: Diagnosis not present

## 2021-04-12 DIAGNOSIS — E291 Testicular hypofunction: Secondary | ICD-10-CM | POA: Diagnosis not present

## 2021-04-12 DIAGNOSIS — R350 Frequency of micturition: Secondary | ICD-10-CM | POA: Diagnosis not present

## 2021-04-12 DIAGNOSIS — N5201 Erectile dysfunction due to arterial insufficiency: Secondary | ICD-10-CM | POA: Diagnosis not present

## 2021-04-24 ENCOUNTER — Other Ambulatory Visit: Payer: Self-pay

## 2021-04-24 ENCOUNTER — Encounter: Payer: Self-pay | Admitting: Sports Medicine

## 2021-04-24 ENCOUNTER — Ambulatory Visit (INDEPENDENT_AMBULATORY_CARE_PROVIDER_SITE_OTHER): Payer: Medicare Other | Admitting: Sports Medicine

## 2021-04-24 DIAGNOSIS — M79674 Pain in right toe(s): Secondary | ICD-10-CM | POA: Diagnosis not present

## 2021-04-24 DIAGNOSIS — B351 Tinea unguium: Secondary | ICD-10-CM

## 2021-04-24 DIAGNOSIS — I739 Peripheral vascular disease, unspecified: Secondary | ICD-10-CM

## 2021-04-24 DIAGNOSIS — M79675 Pain in left toe(s): Secondary | ICD-10-CM

## 2021-04-24 NOTE — Progress Notes (Signed)
Subjective: [name] is a 75 y.o. male patient seen today in office with complaint of mildly painful thickened and elongated toenails; unable to trim states that the left hallux toenail was a little sore at the lateral margin but otherwise doing good.  No other pedal complaints at this time.   Patient Active Problem List   Diagnosis Date Noted   OA (osteoarthritis) of knee 06/17/2013   Onychomycosis 03/21/2012   Sinusitis 12/10/2011   BPH (benign prostatic hypertrophy) with urinary obstruction 09/01/2011   Arthralgia 09/01/2011   SHOULDER PAIN, LEFT 07/23/2010   NOCTURIA 07/23/2010   IBS 05/28/2010   UNSPECIFIED TESTICULAR DYSFUNCTION 04/11/2010   UNSPECIFIED VITAMIN D DEFICIENCY 04/11/2010   MYOSITIS 04/02/2010   ARTHRITIS, CERVICAL SPINE 02/12/2010   OSTEOPENIA 11/27/2009   OSTEOARTHRITIS, ANKLES, BILATERAL 05/31/2009   ANXIETY 03/13/2009   BACK PAIN, CHRONIC 09/14/2008   URETERAL CALCULUS, HX OF 05/30/2008   ASTHMA 12/15/2007   INSOMNIA 12/15/2007   GERD 09/28/2007   HYPOTHYROIDISM 09/07/2007   HYPERLIPIDEMIA 09/07/2007   UNS ADVRS EFF OTH RX MEDICINAL&BIOLOGICAL SBSTNC 09/07/2007    Current Outpatient Medications on File Prior to Visit  Medication Sig Dispense Refill   albuterol (VENTOLIN HFA) 108 (90 Base) MCG/ACT inhaler INHALE 2 PUFFS BY MOUTH EVERY 4 TO 6 HOURS AS NEEDED FOR COUGH OR WHEEZING 18 g 1   allopurinol (ZYLOPRIM) 100 MG tablet Take 100 mg by mouth 2 (two) times daily.      azelastine (ASTELIN) 0.1 % nasal spray Use two sprays in each nostril twice daily as directed for nasal drainage. 30 mL 5   Cholecalciferol (VITAMIN D3 PO) Take by mouth daily.     doxazosin (CARDURA) 2 MG tablet Take 2 mg by mouth every morning.     Fluticasone Propionate (XHANCE) 93 MCG/ACT EXHU Place into the nose.     furosemide (LASIX) 40 MG tablet Take 40 mg by mouth daily.  3   HYDROcodone-acetaminophen (NORCO) 10-325 MG tablet Take 1 tablet by mouth 3 (three) times daily as  needed.  0   levocetirizine (XYZAL) 5 MG tablet Can take one tablet by mouth once daily as directed. 30 tablet 5   methocarbamol (ROBAXIN) 500 MG tablet Take 500 mg by mouth 3 (three) times daily as needed. (Patient not taking: Reported on 03/27/2021)     ofloxacin (OCUFLOX) 0.3 % ophthalmic solution 2 drops. Uses for 3 days after injection.     omeprazole (PRILOSEC) 20 MG capsule Take 20 mg by mouth 2 (two) times daily.     potassium citrate (UROCIT-K) 10 MEQ (1080 MG) SR tablet Take 10 mEq by mouth 2 (two) times daily.     Testosterone Cypionate 200 MG/ML SOLN every 14 (fourteen) days.     No current facility-administered medications on file prior to visit.    Allergies  Allergen Reactions   Caffeine Other (See Comments)    Urinary retention and jittery   Codeine Other (See Comments)    Jittery   Iodinated Diagnostic Agents Other (See Comments)    Hard on kidneys. The blue dye that went through my IV.    Pseudoephedrine Other (See Comments)    Urinary retention   Simvastatin Other (See Comments)    Hurt my bones   Penicillins Itching    Objective: Physical Exam  General: Well developed, nourished, no acute distress, awake, alert and oriented x 3  Vascular: Dorsalis pedis artery 1/4 bilateral, Posterior tibial artery 0/4 bilateral, skin temperature warm to warm proximal to distal  bilateral lower extremities, no varicosities, scant pedal hair present bilateral.  Neurological: Gross sensation present via light touch bilateral.   Dermatological: Skin is warm, dry, and supple bilateral, Nails 1-10 are tender, long, thick, and discolored with mild subungal debris and incurvation at left hallux with no infection, no webspace macerations present bilateral, no open lesions present bilateral, no callus/corns/hyperkeratotic tissue present bilateral. No signs of infection bilateral.  Musculoskeletal: Asymptomatic bunion and hammertoe boney deformities noted bilateral. Muscular strength  within normal limits without painon range of motion. No pain with calf compression bilateral.  Assessment and Plan:  Problem List Items Addressed This Visit   None Visit Diagnoses     Pain due to onychomycosis of toenails of both feet    -  Primary   PVD (peripheral vascular disease) ([place])           -Examined patient.  -Mechanically debrided and reduced mycotic nails with sterile nail nipper and dremel nail file without incident. -Patient to return as scheduled for follow up evaluation or sooner if symptoms worsen.  Advised patient if any ingrown nails keep bothering him may benefit from a permanent nail avulsion procedure in the future however at this time since trims seem to be helping we will continue with the routine schedule.  Return in 9 to 10 weeks or sooner if problems or issues arise.  Landis Martins, DPM

## 2021-04-26 ENCOUNTER — Encounter (INDEPENDENT_AMBULATORY_CARE_PROVIDER_SITE_OTHER): Payer: Medicare Other | Admitting: Ophthalmology

## 2021-04-26 ENCOUNTER — Other Ambulatory Visit: Payer: Self-pay

## 2021-04-26 DIAGNOSIS — H43813 Vitreous degeneration, bilateral: Secondary | ICD-10-CM | POA: Diagnosis not present

## 2021-04-26 DIAGNOSIS — H353112 Nonexudative age-related macular degeneration, right eye, intermediate dry stage: Secondary | ICD-10-CM | POA: Diagnosis not present

## 2021-04-26 DIAGNOSIS — H353221 Exudative age-related macular degeneration, left eye, with active choroidal neovascularization: Secondary | ICD-10-CM | POA: Diagnosis not present

## 2021-04-26 DIAGNOSIS — I10 Essential (primary) hypertension: Secondary | ICD-10-CM

## 2021-04-26 DIAGNOSIS — H35033 Hypertensive retinopathy, bilateral: Secondary | ICD-10-CM

## 2021-04-26 DIAGNOSIS — D3132 Benign neoplasm of left choroid: Secondary | ICD-10-CM | POA: Diagnosis not present

## 2021-05-14 DIAGNOSIS — R051 Acute cough: Secondary | ICD-10-CM | POA: Diagnosis not present

## 2021-05-14 DIAGNOSIS — N1832 Chronic kidney disease, stage 3b: Secondary | ICD-10-CM | POA: Diagnosis not present

## 2021-05-14 DIAGNOSIS — J40 Bronchitis, not specified as acute or chronic: Secondary | ICD-10-CM | POA: Diagnosis not present

## 2021-05-14 DIAGNOSIS — R0981 Nasal congestion: Secondary | ICD-10-CM | POA: Diagnosis not present

## 2021-05-14 DIAGNOSIS — R5383 Other fatigue: Secondary | ICD-10-CM | POA: Diagnosis not present

## 2021-05-14 DIAGNOSIS — I129 Hypertensive chronic kidney disease with stage 1 through stage 4 chronic kidney disease, or unspecified chronic kidney disease: Secondary | ICD-10-CM | POA: Diagnosis not present

## 2021-05-14 DIAGNOSIS — J45909 Unspecified asthma, uncomplicated: Secondary | ICD-10-CM | POA: Diagnosis not present

## 2021-05-14 DIAGNOSIS — Z1152 Encounter for screening for COVID-19: Secondary | ICD-10-CM | POA: Diagnosis not present

## 2021-05-14 DIAGNOSIS — U071 COVID-19: Secondary | ICD-10-CM | POA: Diagnosis not present

## 2021-05-16 DIAGNOSIS — U071 COVID-19: Secondary | ICD-10-CM | POA: Diagnosis not present

## 2021-05-17 ENCOUNTER — Ambulatory Visit: Payer: Medicare Other | Admitting: Cardiovascular Disease

## 2021-05-20 DIAGNOSIS — I82402 Acute embolism and thrombosis of unspecified deep veins of left lower extremity: Secondary | ICD-10-CM | POA: Diagnosis not present

## 2021-05-20 DIAGNOSIS — M519 Unspecified thoracic, thoracolumbar and lumbosacral intervertebral disc disorder: Secondary | ICD-10-CM | POA: Diagnosis not present

## 2021-05-20 DIAGNOSIS — Z9889 Other specified postprocedural states: Secondary | ICD-10-CM | POA: Diagnosis not present

## 2021-05-20 DIAGNOSIS — M069 Rheumatoid arthritis, unspecified: Secondary | ICD-10-CM | POA: Diagnosis not present

## 2021-05-20 DIAGNOSIS — N183 Chronic kidney disease, stage 3 unspecified: Secondary | ICD-10-CM | POA: Diagnosis not present

## 2021-05-20 DIAGNOSIS — N189 Chronic kidney disease, unspecified: Secondary | ICD-10-CM | POA: Diagnosis not present

## 2021-05-20 DIAGNOSIS — N2581 Secondary hyperparathyroidism of renal origin: Secondary | ICD-10-CM | POA: Diagnosis not present

## 2021-05-20 DIAGNOSIS — Z8719 Personal history of other diseases of the digestive system: Secondary | ICD-10-CM | POA: Diagnosis not present

## 2021-05-20 DIAGNOSIS — Z9049 Acquired absence of other specified parts of digestive tract: Secondary | ICD-10-CM | POA: Diagnosis not present

## 2021-05-20 DIAGNOSIS — I129 Hypertensive chronic kidney disease with stage 1 through stage 4 chronic kidney disease, or unspecified chronic kidney disease: Secondary | ICD-10-CM | POA: Diagnosis not present

## 2021-06-03 ENCOUNTER — Other Ambulatory Visit: Payer: Self-pay

## 2021-06-03 ENCOUNTER — Encounter (INDEPENDENT_AMBULATORY_CARE_PROVIDER_SITE_OTHER): Payer: Medicare Other | Admitting: Ophthalmology

## 2021-06-03 DIAGNOSIS — I10 Essential (primary) hypertension: Secondary | ICD-10-CM | POA: Diagnosis not present

## 2021-06-03 DIAGNOSIS — D3132 Benign neoplasm of left choroid: Secondary | ICD-10-CM

## 2021-06-03 DIAGNOSIS — H353112 Nonexudative age-related macular degeneration, right eye, intermediate dry stage: Secondary | ICD-10-CM | POA: Diagnosis not present

## 2021-06-03 DIAGNOSIS — H353221 Exudative age-related macular degeneration, left eye, with active choroidal neovascularization: Secondary | ICD-10-CM

## 2021-06-03 DIAGNOSIS — H35033 Hypertensive retinopathy, bilateral: Secondary | ICD-10-CM

## 2021-06-03 DIAGNOSIS — H43813 Vitreous degeneration, bilateral: Secondary | ICD-10-CM | POA: Diagnosis not present

## 2021-06-12 DIAGNOSIS — R051 Acute cough: Secondary | ICD-10-CM | POA: Diagnosis not present

## 2021-06-12 DIAGNOSIS — J9801 Acute bronchospasm: Secondary | ICD-10-CM | POA: Diagnosis not present

## 2021-06-12 DIAGNOSIS — Z8616 Personal history of COVID-19: Secondary | ICD-10-CM | POA: Diagnosis not present

## 2021-06-19 DIAGNOSIS — M150 Primary generalized (osteo)arthritis: Secondary | ICD-10-CM | POA: Diagnosis not present

## 2021-06-19 DIAGNOSIS — E669 Obesity, unspecified: Secondary | ICD-10-CM | POA: Diagnosis not present

## 2021-06-19 DIAGNOSIS — M5136 Other intervertebral disc degeneration, lumbar region: Secondary | ICD-10-CM | POA: Diagnosis not present

## 2021-06-19 DIAGNOSIS — N1832 Chronic kidney disease, stage 3b: Secondary | ICD-10-CM | POA: Diagnosis not present

## 2021-06-19 DIAGNOSIS — Z6831 Body mass index (BMI) 31.0-31.9, adult: Secondary | ICD-10-CM | POA: Diagnosis not present

## 2021-06-19 DIAGNOSIS — M0579 Rheumatoid arthritis with rheumatoid factor of multiple sites without organ or systems involvement: Secondary | ICD-10-CM | POA: Diagnosis not present

## 2021-06-19 DIAGNOSIS — M1A09X1 Idiopathic chronic gout, multiple sites, with tophus (tophi): Secondary | ICD-10-CM | POA: Diagnosis not present

## 2021-06-19 DIAGNOSIS — Z23 Encounter for immunization: Secondary | ICD-10-CM | POA: Diagnosis not present

## 2021-06-26 ENCOUNTER — Encounter: Payer: Self-pay | Admitting: Sports Medicine

## 2021-06-26 ENCOUNTER — Ambulatory Visit (INDEPENDENT_AMBULATORY_CARE_PROVIDER_SITE_OTHER): Payer: Medicare Other | Admitting: Sports Medicine

## 2021-06-26 ENCOUNTER — Other Ambulatory Visit: Payer: Self-pay

## 2021-06-26 DIAGNOSIS — M79675 Pain in left toe(s): Secondary | ICD-10-CM

## 2021-06-26 DIAGNOSIS — B351 Tinea unguium: Secondary | ICD-10-CM

## 2021-06-26 DIAGNOSIS — J309 Allergic rhinitis, unspecified: Secondary | ICD-10-CM | POA: Diagnosis not present

## 2021-06-26 DIAGNOSIS — M79674 Pain in right toe(s): Secondary | ICD-10-CM

## 2021-06-26 DIAGNOSIS — R053 Chronic cough: Secondary | ICD-10-CM | POA: Diagnosis not present

## 2021-06-26 DIAGNOSIS — J45909 Unspecified asthma, uncomplicated: Secondary | ICD-10-CM | POA: Diagnosis not present

## 2021-06-26 DIAGNOSIS — U099 Post covid-19 condition, unspecified: Secondary | ICD-10-CM | POA: Diagnosis not present

## 2021-06-26 DIAGNOSIS — I739 Peripheral vascular disease, unspecified: Secondary | ICD-10-CM

## 2021-06-26 NOTE — Progress Notes (Signed)
Subjective: [name] is a 75 y.o. male patient seen today in office with complaint of mildly painful thickened and elongated toenails; unable to trim states that he did have to trim the right hallux toenail and accidentally nicked himself.  No other pedal complaints at this time.  Patient Active Problem List   Diagnosis Date Noted   Chronic kidney disease, stage 3b ([place]) 01/12/2020   Rheumatoid arthritis ([place]) 01/12/2020   Secondary polycythemia 01/12/2020   Chronic kidney disease due to hypertension 05/24/2019   Asthma without status asthmaticus 02/23/2019   Encounter for general adult medical examination without abnormal findings 02/23/2019   Exudative age-related macular degeneration ([place]) 02/23/2019   Gout 02/23/2019   Kidney stone 02/23/2019   Obesity 02/23/2019   Pneumonia 02/23/2019   Lumbar post-laminectomy syndrome 04/26/2018   Chronic low back pain 10/23/2017   OA (osteoarthritis) of knee 06/17/2013   Onychomycosis 03/21/2012   Sinusitis 12/10/2011   BPH (benign prostatic hypertrophy) with urinary obstruction 09/01/2011   Arthralgia 09/01/2011   SHOULDER PAIN, LEFT 07/23/2010   NOCTURIA 07/23/2010   IBS 05/28/2010   UNSPECIFIED TESTICULAR DYSFUNCTION 04/11/2010   UNSPECIFIED VITAMIN D DEFICIENCY 04/11/2010   MYOSITIS 04/02/2010   ARTHRITIS, CERVICAL SPINE 02/12/2010   OSTEOPENIA 11/27/2009   OSTEOARTHRITIS, ANKLES, BILATERAL 05/31/2009   ANXIETY 03/13/2009   BACK PAIN, CHRONIC 09/14/2008   URETERAL CALCULUS, HX OF 05/30/2008   ASTHMA 12/15/2007   INSOMNIA 12/15/2007   GERD 09/28/2007   HYPOTHYROIDISM 09/07/2007   HYPERLIPIDEMIA 09/07/2007   UNS ADVRS EFF OTH RX MEDICINAL&BIOLOGICAL SBSTNC 09/07/2007    Current Outpatient Medications on File Prior to Visit  Medication Sig Dispense Refill   albuterol (VENTOLIN HFA) 108 (90 Base) MCG/ACT inhaler INHALE 2 PUFFS BY MOUTH EVERY 4 TO 6 HOURS AS NEEDED FOR COUGH OR WHEEZING 18 g 1   allopurinol (ZYLOPRIM) 100  MG tablet Take 100 mg by mouth 2 (two) times daily.      azelastine (ASTELIN) 0.1 % nasal spray Use two sprays in each nostril twice daily as directed for nasal drainage. 30 mL 5   BINAXNOW COVID-19 AG HOME TEST KIT Use as Directed on the Package     Cholecalciferol (VITAMIN D3 PO) Take by mouth daily.     doxazosin (CARDURA) 2 MG tablet Take 2 mg by mouth every morning.     fluticasone (FLONASE) 50 MCG/ACT nasal spray Place 2 sprays into both nostrils daily.     Fluticasone Propionate (XHANCE) 93 MCG/ACT EXHU Place into the nose.     furosemide (LASIX) 40 MG tablet Take 40 mg by mouth daily.  3   HYDROcodone bit-homatropine (HYCODAN) 5-1.5 MG/5ML syrup Take 5 mLs by mouth every 6 (six) hours as needed.     HYDROcodone-acetaminophen (NORCO) 10-325 MG tablet Take 1 tablet by mouth 3 (three) times daily as needed.  0   levocetirizine (XYZAL) 5 MG tablet Can take one tablet by mouth once daily as directed. 30 tablet 5   methocarbamol (ROBAXIN) 500 MG tablet Take 500 mg by mouth 3 (three) times daily as needed. (Patient not taking: Reported on 03/27/2021)     mupirocin ointment (BACTROBAN) 2 % Apply topically 3 (three) times daily.     ofloxacin (OCUFLOX) 0.3 % ophthalmic solution 2 drops. Uses for 3 days after injection.     omeprazole (PRILOSEC) 20 MG capsule Take 20 mg by mouth 2 (two) times daily.     potassium citrate (UROCIT-K) 10 MEQ (1080 MG) SR tablet Take 10 mEq by  mouth 2 (two) times daily.     predniSONE (DELTASONE) 10 MG tablet Take 10 mg by mouth 2 (two) times daily.     testosterone cypionate (DEPOTESTOSTERONE CYPIONATE) 200 MG/ML injection SMARTSIG:0.5 Milliliter(s) IM Once a Week     Testosterone Cypionate 200 MG/ML SOLN every 14 (fourteen) days.     No current facility-administered medications on file prior to visit.    Allergies  Allergen Reactions   Caffeine Other (See Comments)    Urinary retention and jittery   Codeine Other (See Comments)    Jittery   Iodinated  Diagnostic Agents Other (See Comments)    Hard on kidneys. The blue dye that went through my IV.    Pseudoephedrine Other (See Comments)    Urinary retention   Simvastatin Other (See Comments)    Hurt my bones   Penicillins Itching    Objective: Physical Exam  General: Well developed, nourished, no acute distress, awake, alert and oriented x 3  Vascular: Dorsalis pedis artery 1/4 bilateral, Posterior tibial artery 0/4 bilateral, skin temperature warm to warm proximal to distal bilateral lower extremities, no varicosities, scant pedal hair present bilateral.  Neurological: Gross sensation present via light touch bilateral.   Dermatological: Skin is warm, dry, and supple bilateral, Nails 1-10 are tender, long, thick, and discolored with mild subungal debris there is a small abrasion noted at the right hallux with no acute signs of infection, no webspace macerations present bilateral, no open lesions present bilateral, no callus/corns/hyperkeratotic tissue present bilateral. No signs of infection bilateral.  Musculoskeletal: Asymptomatic bunion and hammertoe boney deformities noted bilateral. Muscular strength within normal limits without painon range of motion. No pain with calf compression bilateral.  Assessment and Plan:  Problem List Items Addressed This Visit   None Visit Diagnoses     Pain due to onychomycosis of toenails of both feet    -  Primary   Relevant Medications   mupirocin ointment (BACTROBAN) 2 %   PVD (peripheral vascular disease) ([place])           -Examined patient.  -Mechanically debrided and reduced mycotic nails with sterile nail nipper and dremel nail file without incident. -Advised patient to apply Neosporin after bath or shower at the small abrasion at the right hallux toenail that was self-induced from patient trimming his own toenails until the area has healed -Return to office 10 to 12 weeks for nail trim or sooner if problems or issues arise  Landis Martins, DPM

## 2021-07-03 ENCOUNTER — Telehealth: Payer: Self-pay | Admitting: Allergy

## 2021-07-03 NOTE — Telephone Encounter (Signed)
Patient states his albuterol inhaler has caused his gums to be very sore. He hadn't used the inhaler in 2-3 days and when he used it again within the next couple of hours his gums started hurting. He is requesting to have another brand sent in to the [facility] in [place].

## 2021-07-04 NOTE — Telephone Encounter (Signed)
Please advise if you would like [place] to send another brand of albuterol or do we need to address this issue?

## 2021-07-09 NOTE — Telephone Encounter (Signed)
It appears he is using Ventolin.

## 2021-07-10 ENCOUNTER — Other Ambulatory Visit: Payer: Self-pay | Admitting: *Deleted

## 2021-07-10 MED ORDER — ALBUTEROL SULFATE HFA 108 (90 BASE) MCG/ACT IN AERS: 2.0000 | INHALATION_SPRAY | RESPIRATORY_TRACT | 3 refills | Status: DC | PRN

## 2021-07-10 NOTE — Telephone Encounter (Signed)
RX for ProAir sent.

## 2021-07-12 ENCOUNTER — Other Ambulatory Visit: Payer: Self-pay

## 2021-07-12 ENCOUNTER — Encounter (INDEPENDENT_AMBULATORY_CARE_PROVIDER_SITE_OTHER): Payer: Medicare Other | Admitting: Ophthalmology

## 2021-07-12 DIAGNOSIS — H353112 Nonexudative age-related macular degeneration, right eye, intermediate dry stage: Secondary | ICD-10-CM

## 2021-07-12 DIAGNOSIS — D3132 Benign neoplasm of left choroid: Secondary | ICD-10-CM

## 2021-07-12 DIAGNOSIS — I10 Essential (primary) hypertension: Secondary | ICD-10-CM

## 2021-07-12 DIAGNOSIS — H43813 Vitreous degeneration, bilateral: Secondary | ICD-10-CM | POA: Diagnosis not present

## 2021-07-12 DIAGNOSIS — H35033 Hypertensive retinopathy, bilateral: Secondary | ICD-10-CM

## 2021-07-12 DIAGNOSIS — H353221 Exudative age-related macular degeneration, left eye, with active choroidal neovascularization: Secondary | ICD-10-CM | POA: Diagnosis not present

## 2021-07-15 NOTE — Progress Notes (Signed)
Cardiology Office Note   Date:  07/17/2021   ID:  [name], DOB 05-15-1946, MRN [id number]  PCP:  Prince Solian, MD  Cardiologist:   Jenkins Rouge, MD   No chief complaint on file.     History of Present Illness:  75 y.o. referred back by Dr Radene Gunning for CAD. Last seen in cardiology 08/19/18  History of PE, CKD, Asthma and RA previously on Remicade but frequent infections.    Retired Administrator. Got remarried after his wife died 7 years ago From [place] in [place] now  No chest pain, palpitations, syncope or edema His PE was 7 years ago or so after some foot surgery.   TTE done 08/30/18 EF 50% ? Distal lateral apical hypokinesis mild RV function reduction Normal ECG at that time and no chest pain other than with reaction to med infusion.   Sees [facility] for secondary polycythemia due to testosterone injections Donating blood every 2 months Hb 12.7 Doing testosterone injections every 2 weeks He seemed to be unaware of association of his testosterone use with secondary erythrocytosis  Seeing pulmonologist next week for asthma/tightness in chest precipitated by sinus infections  Past Medical History:  Diagnosis Date   Arthritis    Asthma    BPH (benign prostatic hyperplasia)    Complication of anesthesia    PROBLEMS VOIDING AFTER HERNIA REPAIR   GERD (gastroesophageal reflux disease)    Gout    Hyperlipidemia    Hypertension    IBS (irritable bowel syndrome)    Macular degeneration    Pulmonary embolus (HCC) 09/06/12   HOSP AT [facility]   Rheumatoid arthritis ([place])    Seasonal allergies    Urinary retention    HX OF KIDNEY STONES-BILATERAL, HX BPH  -- PT TOLD HIS KIDNEY FUNCTIONS  ELVATED WHILE HOSP DEC 2013 FOR PULMONARY EMBOLUS    Past Surgical History:  Procedure Laterality Date   ADENOIDECTOMY     APPENDECTOMY     CHOLECYSTECTOMY     HERNIA REPAIR  2011   umb   LUMBAR LAMINECTOMY     RIGHT HAND SURGERY 06/30/12      TONSILLECTOMY     TOTAL KNEE ARTHROPLASTY Right 06/17/2013   Procedure: RIGHT TOTAL KNEE ARTHROPLASTY;  Surgeon: Gearlean Alf, MD;  Location: [facility];  Service: Orthopedics;  Laterality: Right;     Current Outpatient Medications  Medication Sig Dispense Refill   albuterol (PROAIR HFA) 108 (90 Base) MCG/ACT inhaler Inhale 2 puffs into the lungs every 4 (four) hours as needed for wheezing or shortness of breath. 1 each 3   allopurinol (ZYLOPRIM) 100 MG tablet Take 100 mg by mouth 2 (two) times daily.      azelastine (ASTELIN) 0.1 % nasal spray Use two sprays in each nostril twice daily as directed for nasal drainage. 30 mL 5   Cholecalciferol (VITAMIN D3 PO) Take by mouth daily.     doxazosin (CARDURA) 2 MG tablet Take 2 mg by mouth every morning.     fluticasone (FLONASE) 50 MCG/ACT nasal spray Place 2 sprays into both nostrils daily.     Fluticasone Propionate (XHANCE) 93 MCG/ACT EXHU Place into the nose.     furosemide (LASIX) 40 MG tablet Take 40 mg by mouth daily.  3   HYDROcodone bit-homatropine (HYCODAN) 5-1.5 MG/5ML syrup Take 5 mLs by mouth every 6 (six) hours as needed.     HYDROcodone-acetaminophen (NORCO) 10-325 MG tablet Take 1 tablet  by mouth 3 (three) times daily as needed.  0   levocetirizine (XYZAL) 5 MG tablet Can take one tablet by mouth once daily as directed. 30 tablet 5   methocarbamol (ROBAXIN) 500 MG tablet Take 500 mg by mouth 3 (three) times daily as needed.     mupirocin ointment (BACTROBAN) 2 % Apply topically 3 (three) times daily.     ofloxacin (OCUFLOX) 0.3 % ophthalmic solution 2 drops. Uses for 3 days after injection.     omeprazole (PRILOSEC) 20 MG capsule Take 20 mg by mouth 2 (two) times daily.     potassium citrate (UROCIT-K) 10 MEQ (1080 MG) SR tablet Take 10 mEq by mouth 2 (two) times daily.     predniSONE (DELTASONE) 10 MG tablet Take 10 mg by mouth 2 (two) times daily.     testosterone cypionate (DEPOTESTOSTERONE CYPIONATE) 200 MG/ML injection  SMARTSIG:0.5 Milliliter(s) IM Once a Week     Testosterone Cypionate 200 MG/ML SOLN every 14 (fourteen) days.     BINAXNOW COVID-19 AG HOME TEST KIT Use as Directed on the Package (Patient not taking: Reported on 07/17/2021)     No current facility-administered medications for this visit.    Allergies:   Caffeine, Codeine, Iodinated diagnostic agents, Pseudoephedrine, Simvastatin, and Penicillins    Social History:  The patient  reports that he has quit smoking. His smoking use included cigars. He quit smokeless tobacco use about 42 years ago.  His smokeless tobacco use included chew. He reports that he does not drink alcohol and does not use drugs.   Family History:  The patient's family history includes Heart attack in his mother; High blood pressure in his mother; Prostate cancer in his father; Stroke in his mother.    ROS:  Please see the history of present illness.   Otherwise, review of systems are positive for none.   All other systems are reviewed and negative.    PHYSICAL EXAM: VS:  BP 110/74   Pulse 94   Ht '5\' 11"'  (1.803 m)   Wt 221 lb (100.2 kg)   SpO2 98%   BMI 30.82 kg/m  , BMI Body mass index is 30.82 kg/m. Affect appropriate Healthy:  appears stated age 75: normal Neck supple with no adenopathy JVP normal no bruits no thyromegaly Lungs clear with no wheezing and good diaphragmatic motion Heart:  S1/S2 no murmur, no rub, gallop or click PMI normal Abdomen: benighn, BS positve, no tenderness, no AAA no bruit.  No HSM or HJR Distal pulses intact with no bruits No edema Neuro non-focal Skin warm and dry No muscular weakness    EKG:  07/17/2021 NSR rate 94  normal    Recent Labs: 03/27/2021: ALT 10; BUN 25; Creatinine 1.90; Hemoglobin 12.7; Platelet Count 223; Potassium 4.6; Sodium 141    Lipid Panel    Component Value Date/Time   CHOL 197 12/24/2010 1157   TRIG 103.0 12/24/2010 1157   TRIG 164 (H) 09/24/2006 1307   HDL 51.90 12/24/2010 1157    CHOLHDL 4 12/24/2010 1157   VLDL 20.6 12/24/2010 1157   LDLCALC 125 (H) 12/24/2010 1157      Wt Readings from Last 3 Encounters:  07/17/21 221 lb (100.2 kg)  03/27/21 220 lb 12.8 oz (100.2 kg)  09/24/20 220 lb (99.8 kg)      Other studies Reviewed: Additional studies/ records that were reviewed today include: Notes from primary and [facility]. Notes from allergy .    ASSESSMENT AND PLAN:  1.  Chest Tightness related to drug reaction. ECG normal no indication for stress testing currently 2.  Dyspnea :  Related to infusion and allergies TTE 2019 with EF 50% will update Seeing pulmonologist Monday will likely need PFTls  3. HTN:.  Well controlled.  Continue current medications and low sodium Dash type diet.   4. HLD:  Previously on zetia intolerant to statins "hurts his bones" observe  5. RA:  previous reaction to iv meds asthma worse  6. Asthma:  May be related to chest tightness and dyspnea ? Previous history of PE V/Q negative 2019 seeing pulmonary next week optimize inhalers  7. Polycythemia:  F/ hematology due to testosterone Discussed cardiac risks Continue phlebotomy q 4 months f/u labs Dr Marin Olp    Current medicines are reviewed at length with the patient today.  The patient does not have concerns regarding medicines.  The following changes have been made:  None   Labs/ tests ordered today include: TTE  No orders of the defined types were placed in this encounter.    Disposition:   FU with me in a year     Signed, Jenkins Rouge, MD  07/17/2021 9:15 AM    [address], [place], [place]  [address] Phone: [phone]; Fax: [phone]

## 2021-07-17 ENCOUNTER — Ambulatory Visit (INDEPENDENT_AMBULATORY_CARE_PROVIDER_SITE_OTHER): Payer: Medicare Other | Admitting: Cardiovascular Disease

## 2021-07-17 ENCOUNTER — Other Ambulatory Visit: Payer: Self-pay

## 2021-07-17 ENCOUNTER — Encounter: Payer: Self-pay | Admitting: Cardiovascular Disease

## 2021-07-17 VITALS — BP 110/74 | HR 94 | Ht 71.0 in | Wt 221.0 lb

## 2021-07-17 DIAGNOSIS — R0600 Dyspnea, unspecified: Secondary | ICD-10-CM

## 2021-07-17 DIAGNOSIS — R9430 Abnormal result of cardiovascular function study, unspecified: Secondary | ICD-10-CM | POA: Diagnosis not present

## 2021-07-17 DIAGNOSIS — E349 Endocrine disorder, unspecified: Secondary | ICD-10-CM

## 2021-07-17 DIAGNOSIS — J4520 Mild intermittent asthma, uncomplicated: Secondary | ICD-10-CM

## 2021-07-17 DIAGNOSIS — D751 Secondary polycythemia: Secondary | ICD-10-CM

## 2021-07-17 DIAGNOSIS — E782 Mixed hyperlipidemia: Secondary | ICD-10-CM

## 2021-07-17 DIAGNOSIS — I10 Essential (primary) hypertension: Secondary | ICD-10-CM

## 2021-07-17 NOTE — Patient Instructions (Addendum)
Medication Instructions:  *If you need a refill on your cardiac medications before your next appointment, please call your pharmacy*  Lab Work: If you have labs (blood work) drawn today and your tests are completely normal, you will receive your results only by: [place] (if you have MyChart) OR A paper copy in the mail If you have any lab test that is abnormal or we need to change your treatment, we will call you to review the results.  Testing/Procedures: Your physician has requested that you have an echocardiogram. Echocardiography is a painless test that uses sound waves to create images of your heart. It provides your doctor with information about the size and shape of your heart and how well your heart's chambers and valves are working. This procedure takes approximately one hour. There are no restrictions for this procedure.  Follow-Up: At [facility], you and your health needs are our priority.  As part of our continuing mission to provide you with exceptional heart care, we have created designated Provider Care Teams.  These Care Teams include your primary Cardiologist (physician) and Advanced Practice Providers (APPs -  Physician Assistants and Nurse Practitioners) who all work together to provide you with the care you need, when you need it.  We recommend signing up for the patient portal called "MyChart".  Sign up information is provided on this After Visit Summary.  MyChart is used to connect with patients for Virtual Visits (Telemedicine).  Patients are able to view lab/test results, encounter notes, upcoming appointments, etc.  Non-urgent messages can be sent to your provider as well.   To learn more about what you can do with MyChart, go to [email or web address].    Your next appointment:   12 month(s)  The format for your next appointment:   In Person  Provider:   You may see Jenkins Rouge, MD or one of the following Advanced Practice Providers on your designated  Care Team:   Cecilie Kicks, NP

## 2021-07-25 DIAGNOSIS — M47816 Spondylosis without myelopathy or radiculopathy, lumbar region: Secondary | ICD-10-CM | POA: Diagnosis not present

## 2021-07-25 DIAGNOSIS — M25512 Pain in left shoulder: Secondary | ICD-10-CM | POA: Diagnosis not present

## 2021-07-30 ENCOUNTER — Encounter: Payer: Self-pay | Admitting: Allergy

## 2021-07-30 ENCOUNTER — Other Ambulatory Visit: Payer: Self-pay

## 2021-07-30 ENCOUNTER — Ambulatory Visit (INDEPENDENT_AMBULATORY_CARE_PROVIDER_SITE_OTHER): Payer: Medicare Other | Admitting: Allergy

## 2021-07-30 VITALS — BP 128/62 | HR 87 | Resp 16

## 2021-07-30 DIAGNOSIS — H9213 Otorrhea, bilateral: Secondary | ICD-10-CM

## 2021-07-30 DIAGNOSIS — J310 Chronic rhinitis: Secondary | ICD-10-CM | POA: Diagnosis not present

## 2021-07-30 DIAGNOSIS — U099 Post covid-19 condition, unspecified: Secondary | ICD-10-CM | POA: Diagnosis not present

## 2021-07-30 DIAGNOSIS — R053 Chronic cough: Secondary | ICD-10-CM

## 2021-07-30 MED ORDER — FLUTICASONE PROPIONATE 50 MCG/ACT NA SUSP: 2.0000 | Freq: Every day | NASAL | 5 refills | Status: DC

## 2021-07-30 MED ORDER — AZELASTINE HCL 0.1 % NA SOLN: NASAL | 5 refills | Status: DC

## 2021-07-30 NOTE — Patient Instructions (Addendum)
Rhinitis, non-allergic    - environmental allergy panel obtained in 2019 was negative       - continue xyzal 5mg  daily at this time    -Continue use of Xhance 2 sprays twice a day as needed for nasal congestion control.  Will provide with samples today.    - for nasal drainage/runny nose recommend use of nasal antihistamine, Astelin, 2 sprays each nostril twice a day   Productive Cough History of Wheezing   - recent Covid illness.  Improved but still with lingering symptoms   - provided with a steroid pack (take 20mg  for 4 days then 10mg  on day 5 and stop)   - have access to Ventolin (albuterol) inhaler 2 puffs every 4-6 hours as needed for cough/wheeze/shortness of breath/chest tightness.  May use 15-20 minutes prior to activity.   Monitor frequency of use.     - use inhaler with spacer chamber   Asthma control goals:  Full participation in all desired activities (may need albuterol before activity) Albuterol use two time or less a week on average (not counting use with activity) Cough interfering with sleep two time or less a month Oral steroids no more than once a year No hospitalizations  Ear drainage - thin wax is draining  Follow-up 6 months or sooner if needed

## 2021-07-30 NOTE — Progress Notes (Signed)
Follow-up Note  RE: [name] MRN: [id number] DOB: [birth year] Date of Office Visit: 07/30/2021   History of present illness: [name] is a 75 y.o. male presenting today for follow-up of wheeze and ear drainage.  He was last seen in the office on 01/22/2021 by myself.  He and his wife did have COVID about a month ago.  He states he did not receive any antiviral treatment for this.  He states he does still have chest congestion still.  He also states he has been out of [place] and astelin for the past week. He states with the Covid he did have sore throat, cough, chest tightness and some difficulty breathing.  He states after about 3 weeks he was provided with doxycycline 7 day course that he did complete and it did help.  He reports he did get a shot of some kind.  He also was given cough medication.  And this did help with the cough.  He also states his ear is still draining wax.  He states he did use his albuterol inhaler with his COVID illness.  He is still coughing up yellow thick mucus and does feel like he is having slight difficulty breathing.  He states he can tell a difference with use of Xhance after about 5 minutes of use he states it clears up his congestion.  He also states the Astelin helps with drainage control when he needed.  He would like refills of these.     Review of systems: Review of Systems  Constitutional: Negative.   HENT:  Positive for congestion and sore throat.   Eyes: Negative.   Respiratory:  Positive for cough, sputum production, shortness of breath and wheezing.   Cardiovascular: Negative.   Gastrointestinal: Negative.   Musculoskeletal: Negative.   Skin: Negative.   Neurological: Negative.    All other systems negative unless noted above in HPI  Past medical/social/surgical/family history have been reviewed and are unchanged unless specifically indicated below.  No changes  Medication List: Current Outpatient Medications  Medication Sig  Dispense Refill   albuterol (PROAIR HFA) 108 (90 Base) MCG/ACT inhaler Inhale 2 puffs into the lungs every 4 (four) hours as needed for wheezing or shortness of breath. 1 each 3   allopurinol (ZYLOPRIM) 100 MG tablet Take 100 mg by mouth 2 (two) times daily.      Cholecalciferol (VITAMIN D3 PO) Take by mouth daily.     doxazosin (CARDURA) 2 MG tablet Take 2 mg by mouth every morning.     Fluticasone Propionate (XHANCE) 93 MCG/ACT EXHU Place into the nose.     furosemide (LASIX) 40 MG tablet Take 40 mg by mouth daily.  3   HYDROcodone bit-homatropine (HYCODAN) 5-1.5 MG/5ML syrup Take 5 mLs by mouth every 6 (six) hours as needed.     HYDROcodone-acetaminophen (NORCO) 10-325 MG tablet Take 1 tablet by mouth 3 (three) times daily as needed.  0   levocetirizine (XYZAL) 5 MG tablet Can take one tablet by mouth once daily as directed. 30 tablet 5   methocarbamol (ROBAXIN) 500 MG tablet Take 500 mg by mouth 3 (three) times daily as needed.     mupirocin ointment (BACTROBAN) 2 % Apply topically 3 (three) times daily.     ofloxacin (OCUFLOX) 0.3 % ophthalmic solution 2 drops. Uses for 3 days after injection.     omeprazole (PRILOSEC) 20 MG capsule Take 20 mg by mouth 2 (two) times daily.     potassium  citrate (UROCIT-K) 10 MEQ (1080 MG) SR tablet Take 10 mEq by mouth 2 (two) times daily.     testosterone cypionate (DEPOTESTOSTERONE CYPIONATE) 200 MG/ML injection SMARTSIG:0.5 Milliliter(s) IM Once a Week     Testosterone Cypionate 200 MG/ML SOLN every 14 (fourteen) days.     azelastine (ASTELIN) 0.1 % nasal spray Use two sprays in each nostril twice daily as directed for nasal drainage. 30 mL 5   fluticasone (FLONASE) 50 MCG/ACT nasal spray Place 2 sprays into both nostrils daily. 16 g 5   No current facility-administered medications for this visit.     Known medication allergies: Allergies  Allergen Reactions   Caffeine Other (See Comments)    Urinary retention and jittery   Codeine Other (See  Comments)    Jittery   Iodinated Diagnostic Agents Other (See Comments)    Hard on kidneys. The blue dye that went through my IV.    Pseudoephedrine Other (See Comments)    Urinary retention   Simvastatin Other (See Comments)    Hurt my bones   Penicillins Itching     Physical examination: Blood pressure 128/62, pulse 87, resp. rate 16, SpO2 98 %.  General: Alert, interactive, in no acute distress. HEENT: PERRLA, TMs pearly gray, turbinates minimally edematous without discharge, post-pharynx non erythematous. Neck: Supple without lymphadenopathy. Lungs: Mildly decreased breath sounds bilaterally without wheezing, rhonchi or rales. {no increased work of breathing. CV: Normal S1, S2 without murmurs. Abdomen: Nondistended, nontender. Skin: Warm and dry, without lesions or rashes. Extremities:  No clubbing, cyanosis or edema. Neuro:   Grossly intact.  Diagnositics/Labs: None today   Assessment and plan:   Rhinitis, non-allergic    - environmental allergy panel obtained in 2019 was negative       - continue xyzal 5mg  daily at this time    -Continue use of Xhance 2 sprays twice a day as needed for nasal congestion control.  Will provide with samples today.    - for nasal drainage/runny nose recommend use of nasal antihistamine, Astelin, 2 sprays each nostril twice a day   Productive Cough History of Wheezing   - recent Covid illness.  Improved but still with lingering symptoms   - provided with a steroid pack (take 20mg  for 4 days then 10mg  on day 5 and stop)   - have access to Ventolin (albuterol) inhaler 2 puffs every 4-6 hours as needed for cough/wheeze/shortness of breath/chest tightness.  May use 15-20 minutes prior to activity.   Monitor frequency of use.     - use inhaler with spacer chamber   Asthma control goals:  Full participation in all desired activities (may need albuterol before activity) Albuterol use two time or less a week on average (not counting use with  activity) Cough interfering with sleep two time or less a month Oral steroids no more than once a year No hospitalizations  Ear drainage - thin wax is draining  Follow-up 6 months or sooner if needed  I appreciate the opportunity to take part in Levan's care. Please do not hesitate to contact me with questions.  Sincerely,   Prudy Feeler, MD Allergy/Immunology [facility]

## 2021-08-02 DIAGNOSIS — R350 Frequency of micturition: Secondary | ICD-10-CM | POA: Diagnosis not present

## 2021-08-02 DIAGNOSIS — N401 Enlarged prostate with lower urinary tract symptoms: Secondary | ICD-10-CM | POA: Diagnosis not present

## 2021-08-02 DIAGNOSIS — R3912 Poor urinary stream: Secondary | ICD-10-CM | POA: Diagnosis not present

## 2021-08-05 ENCOUNTER — Ambulatory Visit (HOSPITAL_COMMUNITY): Payer: Medicare Other | Attending: Internal Medicine

## 2021-08-05 ENCOUNTER — Other Ambulatory Visit: Payer: Self-pay

## 2021-08-05 DIAGNOSIS — R0600 Dyspnea, unspecified: Secondary | ICD-10-CM | POA: Diagnosis not present

## 2021-08-05 DIAGNOSIS — R9430 Abnormal result of cardiovascular function study, unspecified: Secondary | ICD-10-CM | POA: Insufficient documentation

## 2021-08-05 LAB — ECHOCARDIOGRAM COMPLETE
Area-P 1/2: 3.12 cm2
S' Lateral: 3.1 cm

## 2021-08-05 NOTE — Progress Notes (Unsigned)
Error

## 2021-08-06 DIAGNOSIS — N1832 Chronic kidney disease, stage 3b: Secondary | ICD-10-CM | POA: Diagnosis not present

## 2021-08-06 DIAGNOSIS — J4540 Moderate persistent asthma, uncomplicated: Secondary | ICD-10-CM | POA: Diagnosis not present

## 2021-08-06 DIAGNOSIS — J309 Allergic rhinitis, unspecified: Secondary | ICD-10-CM | POA: Diagnosis not present

## 2021-08-06 DIAGNOSIS — J329 Chronic sinusitis, unspecified: Secondary | ICD-10-CM | POA: Diagnosis not present

## 2021-08-06 DIAGNOSIS — K219 Gastro-esophageal reflux disease without esophagitis: Secondary | ICD-10-CM | POA: Diagnosis not present

## 2021-08-06 DIAGNOSIS — J189 Pneumonia, unspecified organism: Secondary | ICD-10-CM | POA: Diagnosis not present

## 2021-08-06 DIAGNOSIS — J4541 Moderate persistent asthma with (acute) exacerbation: Secondary | ICD-10-CM | POA: Diagnosis not present

## 2021-08-16 ENCOUNTER — Encounter (INDEPENDENT_AMBULATORY_CARE_PROVIDER_SITE_OTHER): Payer: Medicare Other | Admitting: Ophthalmology

## 2021-08-16 ENCOUNTER — Other Ambulatory Visit: Payer: Self-pay

## 2021-08-16 DIAGNOSIS — H353112 Nonexudative age-related macular degeneration, right eye, intermediate dry stage: Secondary | ICD-10-CM

## 2021-08-16 DIAGNOSIS — H35033 Hypertensive retinopathy, bilateral: Secondary | ICD-10-CM

## 2021-08-16 DIAGNOSIS — I10 Essential (primary) hypertension: Secondary | ICD-10-CM | POA: Diagnosis not present

## 2021-08-16 DIAGNOSIS — D3132 Benign neoplasm of left choroid: Secondary | ICD-10-CM

## 2021-08-16 DIAGNOSIS — H353221 Exudative age-related macular degeneration, left eye, with active choroidal neovascularization: Secondary | ICD-10-CM

## 2021-08-16 DIAGNOSIS — H43813 Vitreous degeneration, bilateral: Secondary | ICD-10-CM | POA: Diagnosis not present

## 2021-09-04 DIAGNOSIS — M069 Rheumatoid arthritis, unspecified: Secondary | ICD-10-CM | POA: Diagnosis not present

## 2021-09-04 DIAGNOSIS — N200 Calculus of kidney: Secondary | ICD-10-CM | POA: Diagnosis not present

## 2021-09-04 DIAGNOSIS — E785 Hyperlipidemia, unspecified: Secondary | ICD-10-CM | POA: Diagnosis not present

## 2021-09-04 DIAGNOSIS — M5380 Other specified dorsopathies, site unspecified: Secondary | ICD-10-CM | POA: Diagnosis not present

## 2021-09-04 DIAGNOSIS — D751 Secondary polycythemia: Secondary | ICD-10-CM | POA: Diagnosis not present

## 2021-09-04 DIAGNOSIS — J45909 Unspecified asthma, uncomplicated: Secondary | ICD-10-CM | POA: Diagnosis not present

## 2021-09-04 DIAGNOSIS — E669 Obesity, unspecified: Secondary | ICD-10-CM | POA: Diagnosis not present

## 2021-09-04 DIAGNOSIS — K219 Gastro-esophageal reflux disease without esophagitis: Secondary | ICD-10-CM | POA: Diagnosis not present

## 2021-09-04 DIAGNOSIS — R300 Dysuria: Secondary | ICD-10-CM | POA: Diagnosis not present

## 2021-09-04 DIAGNOSIS — I129 Hypertensive chronic kidney disease with stage 1 through stage 4 chronic kidney disease, or unspecified chronic kidney disease: Secondary | ICD-10-CM | POA: Diagnosis not present

## 2021-09-04 DIAGNOSIS — N1832 Chronic kidney disease, stage 3b: Secondary | ICD-10-CM | POA: Diagnosis not present

## 2021-09-04 DIAGNOSIS — M1990 Unspecified osteoarthritis, unspecified site: Secondary | ICD-10-CM | POA: Diagnosis not present

## 2021-09-10 DIAGNOSIS — R053 Chronic cough: Secondary | ICD-10-CM | POA: Diagnosis not present

## 2021-09-10 DIAGNOSIS — I700 Atherosclerosis of aorta: Secondary | ICD-10-CM | POA: Diagnosis not present

## 2021-09-10 DIAGNOSIS — I2510 Atherosclerotic heart disease of native coronary artery without angina pectoris: Secondary | ICD-10-CM | POA: Diagnosis not present

## 2021-09-10 DIAGNOSIS — R0600 Dyspnea, unspecified: Secondary | ICD-10-CM | POA: Diagnosis not present

## 2021-09-10 DIAGNOSIS — J189 Pneumonia, unspecified organism: Secondary | ICD-10-CM | POA: Diagnosis not present

## 2021-09-17 DIAGNOSIS — N183 Chronic kidney disease, stage 3 unspecified: Secondary | ICD-10-CM | POA: Diagnosis not present

## 2021-09-18 DIAGNOSIS — J4540 Moderate persistent asthma, uncomplicated: Secondary | ICD-10-CM | POA: Diagnosis not present

## 2021-09-18 DIAGNOSIS — R053 Chronic cough: Secondary | ICD-10-CM | POA: Diagnosis not present

## 2021-09-18 DIAGNOSIS — N1832 Chronic kidney disease, stage 3b: Secondary | ICD-10-CM | POA: Diagnosis not present

## 2021-09-18 DIAGNOSIS — M069 Rheumatoid arthritis, unspecified: Secondary | ICD-10-CM | POA: Diagnosis not present

## 2021-09-20 ENCOUNTER — Other Ambulatory Visit: Payer: Self-pay

## 2021-09-20 ENCOUNTER — Encounter (INDEPENDENT_AMBULATORY_CARE_PROVIDER_SITE_OTHER): Payer: Medicare Other | Admitting: Ophthalmology

## 2021-09-20 DIAGNOSIS — H2513 Age-related nuclear cataract, bilateral: Secondary | ICD-10-CM

## 2021-09-20 DIAGNOSIS — I10 Essential (primary) hypertension: Secondary | ICD-10-CM

## 2021-09-20 DIAGNOSIS — H353112 Nonexudative age-related macular degeneration, right eye, intermediate dry stage: Secondary | ICD-10-CM

## 2021-09-20 DIAGNOSIS — H353221 Exudative age-related macular degeneration, left eye, with active choroidal neovascularization: Secondary | ICD-10-CM

## 2021-09-20 DIAGNOSIS — D3132 Benign neoplasm of left choroid: Secondary | ICD-10-CM | POA: Diagnosis not present

## 2021-09-20 DIAGNOSIS — H43813 Vitreous degeneration, bilateral: Secondary | ICD-10-CM | POA: Diagnosis not present

## 2021-09-20 DIAGNOSIS — H35033 Hypertensive retinopathy, bilateral: Secondary | ICD-10-CM | POA: Diagnosis not present

## 2021-09-23 DIAGNOSIS — M5459 Other low back pain: Secondary | ICD-10-CM | POA: Diagnosis not present

## 2021-09-23 DIAGNOSIS — Z79891 Long term (current) use of opiate analgesic: Secondary | ICD-10-CM | POA: Diagnosis not present

## 2021-09-23 DIAGNOSIS — M47812 Spondylosis without myelopathy or radiculopathy, cervical region: Secondary | ICD-10-CM | POA: Diagnosis not present

## 2021-09-23 DIAGNOSIS — M5136 Other intervertebral disc degeneration, lumbar region: Secondary | ICD-10-CM | POA: Diagnosis not present

## 2021-09-23 DIAGNOSIS — M542 Cervicalgia: Secondary | ICD-10-CM | POA: Diagnosis not present

## 2021-09-23 DIAGNOSIS — M961 Postlaminectomy syndrome, not elsewhere classified: Secondary | ICD-10-CM | POA: Diagnosis not present

## 2021-09-23 DIAGNOSIS — Z5181 Encounter for therapeutic drug level monitoring: Secondary | ICD-10-CM | POA: Diagnosis not present

## 2021-09-23 DIAGNOSIS — M5030 Other cervical disc degeneration, unspecified cervical region: Secondary | ICD-10-CM | POA: Diagnosis not present

## 2021-09-24 ENCOUNTER — Encounter: Payer: Self-pay | Admitting: Sports Medicine

## 2021-09-24 ENCOUNTER — Ambulatory Visit (INDEPENDENT_AMBULATORY_CARE_PROVIDER_SITE_OTHER): Payer: Medicare Other | Admitting: Sports Medicine

## 2021-09-24 DIAGNOSIS — I739 Peripheral vascular disease, unspecified: Secondary | ICD-10-CM

## 2021-09-24 DIAGNOSIS — M79675 Pain in left toe(s): Secondary | ICD-10-CM

## 2021-09-24 DIAGNOSIS — R053 Chronic cough: Secondary | ICD-10-CM | POA: Insufficient documentation

## 2021-09-24 DIAGNOSIS — M79674 Pain in right toe(s): Secondary | ICD-10-CM | POA: Diagnosis not present

## 2021-09-24 DIAGNOSIS — B351 Tinea unguium: Secondary | ICD-10-CM

## 2021-09-24 NOTE — Progress Notes (Signed)
Subjective: [name] is a 75 y.o. male patient seen today in office with complaint of mildly painful thickened and elongated toenails; unable to trim states that he has some soreness on the left hallux nail that tends to grow in on the corner.  No other pedal complaints noted.  Patient Active Problem List   Diagnosis Date Noted   Chronic kidney disease, stage 3b ([place]) 01/12/2020   Rheumatoid arthritis ([place]) 01/12/2020   Secondary polycythemia 01/12/2020   Chronic kidney disease due to hypertension 05/24/2019   Asthma without status asthmaticus 02/23/2019   Encounter for general adult medical examination without abnormal findings 02/23/2019   Exudative age-related macular degeneration ([place]) 02/23/2019   Gout 02/23/2019   Kidney stone 02/23/2019   Obesity 02/23/2019   Pneumonia 02/23/2019   Lumbar post-laminectomy syndrome 04/26/2018   Chronic low back pain 10/23/2017   OA (osteoarthritis) of knee 06/17/2013   Onychomycosis 03/21/2012   Sinusitis 12/10/2011   BPH (benign prostatic hypertrophy) with urinary obstruction 09/01/2011   Arthralgia 09/01/2011   SHOULDER PAIN, LEFT 07/23/2010   NOCTURIA 07/23/2010   IBS 05/28/2010   UNSPECIFIED TESTICULAR DYSFUNCTION 04/11/2010   UNSPECIFIED VITAMIN D DEFICIENCY 04/11/2010   MYOSITIS 04/02/2010   ARTHRITIS, CERVICAL SPINE 02/12/2010   OSTEOPENIA 11/27/2009   OSTEOARTHRITIS, ANKLES, BILATERAL 05/31/2009   ANXIETY 03/13/2009   BACK PAIN, CHRONIC 09/14/2008   URETERAL CALCULUS, HX OF 05/30/2008   ASTHMA 12/15/2007   INSOMNIA 12/15/2007   GERD 09/28/2007   HYPOTHYROIDISM 09/07/2007   HYPERLIPIDEMIA 09/07/2007   UNS ADVRS EFF OTH RX MEDICINAL&BIOLOGICAL SBSTNC 09/07/2007    Current Outpatient Medications on File Prior to Visit  Medication Sig Dispense Refill   albuterol (PROAIR HFA) 108 (90 Base) MCG/ACT inhaler Inhale 2 puffs into the lungs every 4 (four) hours as needed for wheezing or shortness of breath. 1 each 3    allopurinol (ZYLOPRIM) 100 MG tablet Take 100 mg by mouth 2 (two) times daily.      azelastine (ASTELIN) 0.1 % nasal spray Use two sprays in each nostril twice daily as directed for nasal drainage. 30 mL 5   Cholecalciferol (VITAMIN D3 PO) Take by mouth daily.     doxazosin (CARDURA) 2 MG tablet Take 2 mg by mouth every morning.     fluticasone (FLONASE) 50 MCG/ACT nasal spray Place 2 sprays into both nostrils daily. 16 g 5   Fluticasone Propionate (XHANCE) 93 MCG/ACT EXHU Place into the nose.     furosemide (LASIX) 40 MG tablet Take 40 mg by mouth daily.  3   HYDROcodone bit-homatropine (HYCODAN) 5-1.5 MG/5ML syrup Take 5 mLs by mouth every 6 (six) hours as needed.     HYDROcodone-acetaminophen (NORCO) 10-325 MG tablet Take 1 tablet by mouth 3 (three) times daily as needed.  0   levocetirizine (XYZAL) 5 MG tablet Can take one tablet by mouth once daily as directed. 30 tablet 5   methocarbamol (ROBAXIN) 500 MG tablet Take 500 mg by mouth 3 (three) times daily as needed.     mupirocin ointment (BACTROBAN) 2 % Apply topically 3 (three) times daily.     ofloxacin (OCUFLOX) 0.3 % ophthalmic solution 2 drops. Uses for 3 days after injection.     omeprazole (PRILOSEC) 20 MG capsule Take 20 mg by mouth 2 (two) times daily.     potassium citrate (UROCIT-K) 10 MEQ (1080 MG) SR tablet Take 10 mEq by mouth 2 (two) times daily.     testosterone cypionate (DEPOTESTOSTERONE CYPIONATE) 200 MG/ML injection SMARTSIG:0.5  Milliliter(s) IM Once a Week     Testosterone Cypionate 200 MG/ML SOLN every 14 (fourteen) days.     No current facility-administered medications on file prior to visit.    Allergies  Allergen Reactions   Caffeine Other (See Comments)    Urinary retention and jittery   Codeine Other (See Comments)    Jittery   Iodinated Diagnostic Agents Other (See Comments)    Hard on kidneys. The blue dye that went through my IV.    Pseudoephedrine Other (See Comments)    Urinary retention    Simvastatin Other (See Comments)    Hurt my bones   Penicillins Itching    Objective: Physical Exam  General: Well developed, nourished, no acute distress, awake, alert and oriented x 3  Vascular: Dorsalis pedis artery 1/4 bilateral, Posterior tibial artery 0/4 bilateral, due to trace edema on ankles.  Skin temperature warm to warm proximal to distal bilateral lower extremities, no varicosities, scant pedal hair present bilateral.  Neurological: Gross sensation present via light touch bilateral.   Dermatological: Skin is warm, dry, and supple bilateral, Nails 1-10 are tender, long, thick, and discolored with mild subungal debris there is mild incurvation at the medial nail border of the left great toe.  No open lesions present bilateral, no callus/corns/hyperkeratotic tissue present bilateral. No signs of infection bilateral.  Musculoskeletal: Asymptomatic bunion and hammertoe boney deformities noted bilateral. Muscular strength within normal limits without painon range of motion. No pain with calf compression bilateral.  Assessment and Plan:  Problem List Items Addressed This Visit   None Visit Diagnoses     Pain due to onychomycosis of toenails of both feet    -  Primary   PVD (peripheral vascular disease) ([place])           -Examined patient.  -Mechanically debrided and reduced mycotic nails with sterile nail nipper and dremel nail file without incident. -Advised patient in the future if he keeps having problems with the corner growing in and being sore may benefit from nail avulsion procedure at a future visit -Return to office 10 to 12 weeks for nail trim or sooner if problems or issues arise  Landis Martins, DPM

## 2021-09-25 ENCOUNTER — Encounter: Payer: Self-pay | Admitting: Hematology & Oncology

## 2021-09-25 ENCOUNTER — Other Ambulatory Visit: Payer: Self-pay

## 2021-09-25 ENCOUNTER — Inpatient Hospital Stay: Payer: Medicare Other | Attending: Hematology & Oncology

## 2021-09-25 ENCOUNTER — Inpatient Hospital Stay (HOSPITAL_BASED_OUTPATIENT_CLINIC_OR_DEPARTMENT_OTHER): Payer: Medicare Other | Admitting: Hematology & Oncology

## 2021-09-25 VITALS — BP 145/57 | HR 89 | Temp 98.3°F | Resp 19 | Ht 71.0 in | Wt 219.1 lb

## 2021-09-25 DIAGNOSIS — Z8639 Personal history of other endocrine, nutritional and metabolic disease: Secondary | ICD-10-CM | POA: Diagnosis not present

## 2021-09-25 DIAGNOSIS — D751 Secondary polycythemia: Secondary | ICD-10-CM | POA: Insufficient documentation

## 2021-09-25 DIAGNOSIS — I129 Hypertensive chronic kidney disease with stage 1 through stage 4 chronic kidney disease, or unspecified chronic kidney disease: Secondary | ICD-10-CM

## 2021-09-25 DIAGNOSIS — K2100 Gastro-esophageal reflux disease with esophagitis, without bleeding: Secondary | ICD-10-CM

## 2021-09-25 DIAGNOSIS — D500 Iron deficiency anemia secondary to blood loss (chronic): Secondary | ICD-10-CM

## 2021-09-25 LAB — CBC WITH DIFFERENTIAL (CANCER CENTER ONLY)
Abs Immature Granulocytes: 0.03 10*3/uL (ref 0.00–0.07)
Basophils Absolute: 0 10*3/uL (ref 0.0–0.1)
Basophils Relative: 1 %
Eosinophils Absolute: 0 10*3/uL (ref 0.0–0.5)
Eosinophils Relative: 0 %
HCT: 40.3 % (ref 39.0–52.0)
Hemoglobin: 11.7 g/dL — ABNORMAL LOW (ref 13.0–17.0)
Immature Granulocytes: 1 %
Lymphocytes Relative: 25 %
Lymphs Abs: 1.7 10*3/uL (ref 0.7–4.0)
MCH: 21.8 pg — ABNORMAL LOW (ref 26.0–34.0)
MCHC: 29 g/dL — ABNORMAL LOW (ref 30.0–36.0)
MCV: 75.2 fL — ABNORMAL LOW (ref 80.0–100.0)
Monocytes Absolute: 0.8 10*3/uL (ref 0.1–1.0)
Monocytes Relative: 12 %
Neutro Abs: 4.1 10*3/uL (ref 1.7–7.7)
Neutrophils Relative %: 61 %
Platelet Count: 251 10*3/uL (ref 150–400)
RBC: 5.36 MIL/uL (ref 4.22–5.81)
RDW: 17.9 % — ABNORMAL HIGH (ref 11.5–15.5)
WBC Count: 6.7 10*3/uL (ref 4.0–10.5)
nRBC: 0 % (ref 0.0–0.2)

## 2021-09-25 LAB — CMP (CANCER CENTER ONLY)
ALT: 9 U/L (ref 0–44)
AST: 13 U/L — ABNORMAL LOW (ref 15–41)
Albumin: 4.1 g/dL (ref 3.5–5.0)
Alkaline Phosphatase: 52 U/L (ref 38–126)
Anion gap: 9 (ref 5–15)
BUN: 25 mg/dL — ABNORMAL HIGH (ref 8–23)
CO2: 30 mmol/L (ref 22–32)
Calcium: 10 mg/dL (ref 8.9–10.3)
Chloride: 104 mmol/L (ref 98–111)
Creatinine: 1.72 mg/dL — ABNORMAL HIGH (ref 0.61–1.24)
GFR, Estimated: 40 mL/min — ABNORMAL LOW (ref >60)
Glucose, Bld: 105 mg/dL — ABNORMAL HIGH (ref 70–99)
Potassium: 4.3 mmol/L (ref 3.5–5.1)
Sodium: 143 mmol/L (ref 135–145)
Total Bilirubin: 1 mg/dL (ref 0.3–1.2)
Total Protein: 6.6 g/dL (ref 6.5–8.1)

## 2021-09-25 NOTE — Progress Notes (Signed)
Hematology and Oncology Follow Up Visit  [name] [id number] [birth year] 75 y.o. 09/25/2021   Principle Diagnosis:  Secondary polycythemia due to testosterone injections  Current Therapy:   Patient donating blood every 4 months.     Interim History:  [name] is back for follow-up.  We see him every 6 months.  Since we last saw him, he has been having some difficulty with his breathing.  He has been out at [facility].  He has had a thorough work-up out there.  I will know if they found anything that was significant.  Sounds like he may have had some sinus drainage.  He may have little bit of fibrosis.  He is never smoked.  He is doing well with his polycythemia.  He is donating blood every 4 months.    There is been no problems with fever.  He has had no problems with COVID.  He has had no issues with nausea or vomiting.  He has had no change in bowel or bladder habits.  Overall, I would say his performance status is probably ECOG 1.    Medications:  Current Outpatient Medications:    albuterol (PROAIR HFA) 108 (90 Base) MCG/ACT inhaler, Inhale 2 puffs into the lungs every 4 (four) hours as needed for wheezing or shortness of breath., Disp: 1 each, Rfl: 3   allopurinol (ZYLOPRIM) 100 MG tablet, Take 100 mg by mouth 2 (two) times daily. , Disp: , Rfl:    azelastine (ASTELIN) 0.1 % nasal spray, Use two sprays in each nostril twice daily as directed for nasal drainage., Disp: 30 mL, Rfl: 5   Cholecalciferol (VITAMIN D3 PO), Take by mouth daily., Disp: , Rfl:    doxazosin (CARDURA) 2 MG tablet, Take 2 mg by mouth every morning., Disp: , Rfl:    doxazosin (CARDURA) 2 MG tablet, Take 1 tablet by mouth 2 (two) times daily., Disp: , Rfl:    fluticasone (FLONASE) 50 MCG/ACT nasal spray, Place 2 sprays into both nostrils daily., Disp: 16 g, Rfl: 5   Fluticasone Propionate (XHANCE) 93 MCG/ACT EXHU, Place into the nose., Disp: , Rfl:    furosemide (LASIX) 40 MG tablet, Take 40 mg by  mouth daily., Disp: , Rfl: 3   gentamicin (GARAMYCIN) 0.3 % ophthalmic solution, Use 1 drop 2x a day to affected eye, Disp: , Rfl:    HYDROcodone bit-homatropine (HYCODAN) 5-1.5 MG/5ML syrup, Take 5 mLs by mouth every 6 (six) hours as needed., Disp: , Rfl:    HYDROcodone-acetaminophen (NORCO) 10-325 MG tablet, Take 1 tablet by mouth 3 (three) times daily as needed., Disp: , Rfl: 0   levocetirizine (XYZAL) 5 MG tablet, Can take one tablet by mouth once daily as directed., Disp: 30 tablet, Rfl: 5   methocarbamol (ROBAXIN) 500 MG tablet, Take 500 mg by mouth 3 (three) times daily as needed., Disp: , Rfl:    mupirocin ointment (BACTROBAN) 2 %, Apply topically 3 (three) times daily., Disp: , Rfl:    ofloxacin (OCUFLOX) 0.3 % ophthalmic solution, 2 drops. Uses for 3 days after injection., Disp: , Rfl:    omeprazole (PRILOSEC) 20 MG capsule, Take 20 mg by mouth 2 (two) times daily., Disp: , Rfl:    oxybutynin (DITROPAN) 5 MG tablet, Take by mouth., Disp: , Rfl:    oxyCODONE-acetaminophen (PERCOCET) 10-325 MG tablet, Percocet 10 mg-325 mg tablet  Take 1 tablet 3 times a day by oral route as needed for pain., Disp: , Rfl:    potassium citrate (  UROCIT-K) 10 MEQ (1080 MG) SR tablet, Take 10 mEq by mouth 2 (two) times daily., Disp: , Rfl:    potassium citrate (UROCIT-K) 10 MEQ (1080 MG) SR tablet, Take by mouth., Disp: , Rfl:    SYMBICORT 80-4.5 MCG/ACT inhaler, SMARTSIG:2 Puff(s) By Mouth Twice Daily, Disp: , Rfl:    testosterone cypionate (DEPOTESTOSTERONE CYPIONATE) 200 MG/ML injection, SMARTSIG:0.5 Milliliter(s) IM Once a Week, Disp: , Rfl:    testosterone cypionate (DEPOTESTOSTERONE CYPIONATE) 200 MG/ML injection, once a week., Disp: , Rfl:   Allergies:  Allergies  Allergen Reactions   Caffeine Other (See Comments)    Urinary retention and jittery   Codeine Other (See Comments)    Jittery   Iodinated Diagnostic Agents Other (See Comments)    Hard on kidneys. The blue dye that went through my IV.     Pseudoephedrine Other (See Comments)    Urinary retention   Simvastatin Other (See Comments)    Hurt my bones   Penicillins Itching    Past Medical History, Surgical history, Social history, and Family History were reviewed and updated.  Review of Systems: Review of Systems  Constitutional: Negative.   HENT:  Negative.    Eyes: Negative.   Respiratory: Negative.    Cardiovascular: Negative.   Gastrointestinal: Negative.   Endocrine: Negative.   Genitourinary: Negative.    Musculoskeletal: Negative.   Skin: Negative.   Neurological: Negative.   Hematological: Negative.   Psychiatric/Behavioral: Negative.     Physical Exam:  height is 5\' 11"  (1.803 m) and weight is 219 lb 1.9 oz (99.4 kg). His oral temperature is 98.3 F (36.8 C). His blood pressure is 145/57 (abnormal) and his pulse is 89. His respiration is 19 and oxygen saturation is 98%.   Wt Readings from Last 3 Encounters:  09/25/21 219 lb 1.9 oz (99.4 kg)  07/17/21 221 lb (100.2 kg)  03/27/21 220 lb 12.8 oz (100.2 kg)    Physical Exam Vitals reviewed.  HENT:     Head: Normocephalic and atraumatic.  Eyes:     Pupils: Pupils are equal, round, and reactive to light.  Cardiovascular:     Rate and Rhythm: Normal rate and regular rhythm.     Heart sounds: Normal heart sounds.  Pulmonary:     Effort: Pulmonary effort is normal.     Breath sounds: Normal breath sounds.  Abdominal:     General: Bowel sounds are normal.     Palpations: Abdomen is soft.  Musculoskeletal:        General: No tenderness or deformity. Normal range of motion.     Cervical back: Normal range of motion.  Lymphadenopathy:     Cervical: No cervical adenopathy.  Skin:    General: Skin is warm and dry.     Findings: No erythema or rash.  Neurological:     Mental Status: He is alert and oriented to person, place, and time.  Psychiatric:        Behavior: Behavior normal.        Thought Content: Thought content normal.        Judgment:  Judgment normal.     Lab Results  Component Value Date   WBC 6.7 09/25/2021   HGB 11.7 (L) 09/25/2021   HCT 40.3 09/25/2021   MCV 75.2 (L) 09/25/2021   PLT 251 09/25/2021     Chemistry      Component Value Date/Time   NA 141 03/27/2021 1307   K 4.6 03/27/2021 1307   CL 102  03/27/2021 1307   CO2 31 03/27/2021 1307   BUN 25 (H) 03/27/2021 1307   CREATININE 1.90 (H) 03/27/2021 1307      Component Value Date/Time   CALCIUM 9.9 03/27/2021 1307   ALKPHOS 64 03/27/2021 1307   AST 13 (L) 03/27/2021 1307   ALT 10 03/27/2021 1307   BILITOT 0.8 03/27/2021 1307      Impression and Plan: [name] is a very nice 75 year old white male.  He has secondary polycythemia.  He is on testosterone injections which he needs.  Again, he is donating blood.  This is incredibly important.  His blood is perfect.  He has nothing wrong with his blood that he cannot donate.  I think that every 4 months would be reasonable for donations.  I do not see that his hematologic issue has anything to do with his lung issue.  For right now, we will plan to get him back in 6 more months.   Volanda Napoleon, MD 12/21/20221:37 PM

## 2021-09-26 LAB — IRON AND IRON BINDING CAPACITY (CC-WL,HP ONLY)
Iron: 18 ug/dL — ABNORMAL LOW (ref 45–182)
Saturation Ratios: 4 % — ABNORMAL LOW (ref 17.9–39.5)
TIBC: 489 ug/dL — ABNORMAL HIGH (ref 250–450)
UIBC: 471 ug/dL — ABNORMAL HIGH (ref 117–376)

## 2021-09-26 LAB — FERRITIN: Ferritin: 6 ng/mL — ABNORMAL LOW (ref 24–336)

## 2021-09-27 ENCOUNTER — Telehealth: Payer: Self-pay | Admitting: Hematology & Oncology

## 2021-09-27 NOTE — Telephone Encounter (Signed)
Contacted patient to schedule per los 12/21, he is aware of appts on 6/21.

## 2021-10-15 DIAGNOSIS — M47812 Spondylosis without myelopathy or radiculopathy, cervical region: Secondary | ICD-10-CM | POA: Diagnosis not present

## 2021-10-15 DIAGNOSIS — M5136 Other intervertebral disc degeneration, lumbar region: Secondary | ICD-10-CM | POA: Diagnosis not present

## 2021-10-22 ENCOUNTER — Encounter (INDEPENDENT_AMBULATORY_CARE_PROVIDER_SITE_OTHER): Payer: Medicare Other | Admitting: Ophthalmology

## 2021-10-22 ENCOUNTER — Other Ambulatory Visit: Payer: Self-pay

## 2021-10-22 DIAGNOSIS — I10 Essential (primary) hypertension: Secondary | ICD-10-CM | POA: Diagnosis not present

## 2021-10-22 DIAGNOSIS — H353221 Exudative age-related macular degeneration, left eye, with active choroidal neovascularization: Secondary | ICD-10-CM

## 2021-10-22 DIAGNOSIS — D3132 Benign neoplasm of left choroid: Secondary | ICD-10-CM

## 2021-10-22 DIAGNOSIS — H35033 Hypertensive retinopathy, bilateral: Secondary | ICD-10-CM

## 2021-10-22 DIAGNOSIS — H43813 Vitreous degeneration, bilateral: Secondary | ICD-10-CM | POA: Diagnosis not present

## 2021-10-22 DIAGNOSIS — H353111 Nonexudative age-related macular degeneration, right eye, early dry stage: Secondary | ICD-10-CM

## 2021-11-13 DIAGNOSIS — Z9049 Acquired absence of other specified parts of digestive tract: Secondary | ICD-10-CM | POA: Diagnosis not present

## 2021-11-13 DIAGNOSIS — N2581 Secondary hyperparathyroidism of renal origin: Secondary | ICD-10-CM | POA: Diagnosis not present

## 2021-11-13 DIAGNOSIS — M069 Rheumatoid arthritis, unspecified: Secondary | ICD-10-CM | POA: Diagnosis not present

## 2021-11-13 DIAGNOSIS — Z9889 Other specified postprocedural states: Secondary | ICD-10-CM | POA: Diagnosis not present

## 2021-11-13 DIAGNOSIS — Z8719 Personal history of other diseases of the digestive system: Secondary | ICD-10-CM | POA: Diagnosis not present

## 2021-11-13 DIAGNOSIS — N183 Chronic kidney disease, stage 3 unspecified: Secondary | ICD-10-CM | POA: Diagnosis not present

## 2021-11-13 DIAGNOSIS — M519 Unspecified thoracic, thoracolumbar and lumbosacral intervertebral disc disorder: Secondary | ICD-10-CM | POA: Diagnosis not present

## 2021-11-13 DIAGNOSIS — I129 Hypertensive chronic kidney disease with stage 1 through stage 4 chronic kidney disease, or unspecified chronic kidney disease: Secondary | ICD-10-CM | POA: Diagnosis not present

## 2021-11-13 DIAGNOSIS — I82402 Acute embolism and thrombosis of unspecified deep veins of left lower extremity: Secondary | ICD-10-CM | POA: Diagnosis not present

## 2021-11-26 DIAGNOSIS — J324 Chronic pansinusitis: Secondary | ICD-10-CM | POA: Diagnosis not present

## 2021-11-29 ENCOUNTER — Other Ambulatory Visit: Payer: Self-pay

## 2021-11-29 ENCOUNTER — Encounter (INDEPENDENT_AMBULATORY_CARE_PROVIDER_SITE_OTHER): Payer: Medicare Other | Admitting: Ophthalmology

## 2021-11-29 DIAGNOSIS — H2513 Age-related nuclear cataract, bilateral: Secondary | ICD-10-CM

## 2021-11-29 DIAGNOSIS — I10 Essential (primary) hypertension: Secondary | ICD-10-CM

## 2021-11-29 DIAGNOSIS — H353221 Exudative age-related macular degeneration, left eye, with active choroidal neovascularization: Secondary | ICD-10-CM

## 2021-11-29 DIAGNOSIS — H43813 Vitreous degeneration, bilateral: Secondary | ICD-10-CM | POA: Diagnosis not present

## 2021-11-29 DIAGNOSIS — H35033 Hypertensive retinopathy, bilateral: Secondary | ICD-10-CM

## 2021-11-29 DIAGNOSIS — D3132 Benign neoplasm of left choroid: Secondary | ICD-10-CM | POA: Diagnosis not present

## 2021-11-29 DIAGNOSIS — H353112 Nonexudative age-related macular degeneration, right eye, intermediate dry stage: Secondary | ICD-10-CM | POA: Diagnosis not present

## 2021-12-09 DIAGNOSIS — H25812 Combined forms of age-related cataract, left eye: Secondary | ICD-10-CM | POA: Diagnosis not present

## 2021-12-09 DIAGNOSIS — H353231 Exudative age-related macular degeneration, bilateral, with active choroidal neovascularization: Secondary | ICD-10-CM | POA: Diagnosis not present

## 2021-12-11 DIAGNOSIS — M5459 Other low back pain: Secondary | ICD-10-CM | POA: Diagnosis not present

## 2021-12-11 DIAGNOSIS — M47896 Other spondylosis, lumbar region: Secondary | ICD-10-CM | POA: Diagnosis not present

## 2021-12-11 DIAGNOSIS — M545 Low back pain, unspecified: Secondary | ICD-10-CM | POA: Diagnosis not present

## 2021-12-11 DIAGNOSIS — M542 Cervicalgia: Secondary | ICD-10-CM | POA: Diagnosis not present

## 2021-12-11 DIAGNOSIS — M5136 Other intervertebral disc degeneration, lumbar region: Secondary | ICD-10-CM | POA: Diagnosis not present

## 2021-12-18 DIAGNOSIS — E669 Obesity, unspecified: Secondary | ICD-10-CM | POA: Diagnosis not present

## 2021-12-18 DIAGNOSIS — M1991 Primary osteoarthritis, unspecified site: Secondary | ICD-10-CM | POA: Diagnosis not present

## 2021-12-18 DIAGNOSIS — M0579 Rheumatoid arthritis with rheumatoid factor of multiple sites without organ or systems involvement: Secondary | ICD-10-CM | POA: Diagnosis not present

## 2021-12-18 DIAGNOSIS — M1A09X1 Idiopathic chronic gout, multiple sites, with tophus (tophi): Secondary | ICD-10-CM | POA: Diagnosis not present

## 2021-12-18 DIAGNOSIS — N1832 Chronic kidney disease, stage 3b: Secondary | ICD-10-CM | POA: Diagnosis not present

## 2021-12-18 DIAGNOSIS — Z6830 Body mass index (BMI) 30.0-30.9, adult: Secondary | ICD-10-CM | POA: Diagnosis not present

## 2021-12-18 DIAGNOSIS — M5136 Other intervertebral disc degeneration, lumbar region: Secondary | ICD-10-CM | POA: Diagnosis not present

## 2021-12-24 ENCOUNTER — Encounter: Payer: Self-pay | Admitting: Sports Medicine

## 2021-12-24 ENCOUNTER — Ambulatory Visit (INDEPENDENT_AMBULATORY_CARE_PROVIDER_SITE_OTHER): Payer: Medicare Other | Admitting: Sports Medicine

## 2021-12-24 ENCOUNTER — Other Ambulatory Visit: Payer: Self-pay

## 2021-12-24 DIAGNOSIS — M1A09X1 Idiopathic chronic gout, multiple sites, with tophus (tophi): Secondary | ICD-10-CM | POA: Insufficient documentation

## 2021-12-24 DIAGNOSIS — I739 Peripheral vascular disease, unspecified: Secondary | ICD-10-CM | POA: Diagnosis not present

## 2021-12-24 DIAGNOSIS — B351 Tinea unguium: Secondary | ICD-10-CM

## 2021-12-24 DIAGNOSIS — M79675 Pain in left toe(s): Secondary | ICD-10-CM

## 2021-12-24 DIAGNOSIS — M79674 Pain in right toe(s): Secondary | ICD-10-CM | POA: Diagnosis not present

## 2021-12-24 NOTE — Progress Notes (Signed)
Subjective: ?[name] is a 76 y.o. male patient seen today in office with complaint of mildly painful thickened and elongated toenails; unable to trim states that he has some soreness on the left hallux nail but it did good after last trim.  No other pedal complaints noted. ? ?Patient Active Problem List  ? Diagnosis Date Noted  ? Idiopathic chronic gout, multiple sites, with tophus (tophi) 12/24/2021  ? Chronic cough 09/24/2021  ? Chronic kidney disease, stage 3b ([place]) 01/12/2020  ? Rheumatoid arthritis ([place]) 01/12/2020  ? Secondary polycythemia 01/12/2020  ? Chronic kidney disease due to hypertension 05/24/2019  ? Asthma without status asthmaticus 02/23/2019  ? Encounter for general adult medical examination without abnormal findings 02/23/2019  ? Exudative age-related macular degeneration ([place]) 02/23/2019  ? Gout 02/23/2019  ? Kidney stone 02/23/2019  ? Obesity 02/23/2019  ? Pneumonia 02/23/2019  ? Lumbar post-laminectomy syndrome 04/26/2018  ? Chronic low back pain 10/23/2017  ? OA (osteoarthritis) of knee 06/17/2013  ? Onychomycosis 03/21/2012  ? Sinusitis 12/10/2011  ? BPH (benign prostatic hypertrophy) with urinary obstruction 09/01/2011  ? Arthralgia 09/01/2011  ? SHOULDER PAIN, LEFT 07/23/2010  ? NOCTURIA 07/23/2010  ? IBS 05/28/2010  ? UNSPECIFIED TESTICULAR DYSFUNCTION 04/11/2010  ? UNSPECIFIED VITAMIN D DEFICIENCY 04/11/2010  ? MYOSITIS 04/02/2010  ? ARTHRITIS, CERVICAL SPINE 02/12/2010  ? OSTEOPENIA 11/27/2009  ? OSTEOARTHRITIS, ANKLES, BILATERAL 05/31/2009  ? ANXIETY 03/13/2009  ? BACK PAIN, CHRONIC 09/14/2008  ? URETERAL CALCULUS, HX OF 05/30/2008  ? ASTHMA 12/15/2007  ? INSOMNIA 12/15/2007  ? GERD 09/28/2007  ? HYPOTHYROIDISM 09/07/2007  ? HYPERLIPIDEMIA 09/07/2007  ? UNS ADVRS EFF OTH RX MEDICINAL&BIOLOGICAL SBSTNC 09/07/2007  ? ? ?Current Outpatient Medications on File Prior to Visit  ?Medication Sig Dispense Refill  ? hydroxychloroquine (PLAQUENIL) 200 MG tablet 1 tablet with food or milk     ? albuterol (PROAIR HFA) 108 (90 Base) MCG/ACT inhaler Inhale 2 puffs into the lungs every 4 (four) hours as needed for wheezing or shortness of breath. 1 each 3  ? allopurinol (ZYLOPRIM) 100 MG tablet Take 100 mg by mouth 2 (two) times daily.     ? allopurinol (ZYLOPRIM) 100 MG tablet 2 tablet    ? azelastine (ASTELIN) 0.1 % nasal spray Use two sprays in each nostril twice daily as directed for nasal drainage. 30 mL 5  ? Cholecalciferol (VITAMIN D3 PO) Take by mouth daily.    ? doxazosin (CARDURA) 2 MG tablet Take 2 mg by mouth every morning.    ? doxazosin (CARDURA) 2 MG tablet Take 1 tablet by mouth 2 (two) times daily.    ? doxazosin (CARDURA) 2 MG tablet 1 tablet    ? doxycycline (VIBRAMYCIN) 100 MG capsule Take 100 mg by mouth 2 (two) times daily.    ? fluticasone (FLONASE) 50 MCG/ACT nasal spray Place 2 sprays into both nostrils daily. 16 g 5  ? Fluticasone Propionate (XHANCE) 93 MCG/ACT EXHU Place into the nose.    ? furosemide (LASIX) 40 MG tablet Take 40 mg by mouth daily.  3  ? gentamicin (GARAMYCIN) 0.3 % ophthalmic solution Use 1 drop 2x a day to affected eye    ? HYDROcodone bit-homatropine (HYCODAN) 5-1.5 MG/5ML syrup Take 5 mLs by mouth every 6 (six) hours as needed.    ? HYDROcodone-acetaminophen (NORCO) 10-325 MG tablet Take 1 tablet by mouth 3 (three) times daily as needed.  0  ? ketorolac (ACULAR) 0.5 % ophthalmic solution Place into the left eye.    ?  levocetirizine (XYZAL) 5 MG tablet Can take one tablet by mouth once daily as directed. 30 tablet 5  ? levocetirizine (XYZAL) 5 MG tablet 1 tablet in the evening    ? methocarbamol (ROBAXIN) 500 MG tablet Take 500 mg by mouth 3 (three) times daily as needed.    ? montelukast (SINGULAIR) 10 MG tablet 1 tablet    ? mupirocin ointment (BACTROBAN) 2 % Apply topically 3 (three) times daily.    ? ofloxacin (OCUFLOX) 0.3 % ophthalmic solution 2 drops. Uses for 3 days after injection.    ? omeprazole (PRILOSEC) 20 MG capsule Take 20 mg by mouth 2 (two)  times daily.    ? omeprazole (PRILOSEC) 20 MG capsule 1 capsule    ? oxybutynin (DITROPAN) 5 MG tablet Take by mouth.    ? oxyCODONE-acetaminophen (PERCOCET) 10-325 MG tablet Percocet 10 mg-325 mg tablet ? Take 1 tablet 3 times a day by oral route as needed for pain.    ? potassium chloride (KLOR-CON M10) 10 MEQ tablet 1 tablet with food    ? potassium citrate (UROCIT-K) 10 MEQ (1080 MG) SR tablet Take 10 mEq by mouth 2 (two) times daily.    ? potassium citrate (UROCIT-K) 10 MEQ (1080 MG) SR tablet Take by mouth.    ? predniSONE (DELTASONE) 5 MG tablet 1 tablet    ? SYMBICORT 80-4.5 MCG/ACT inhaler SMARTSIG:2 Puff(s) By Mouth Twice Daily    ? tadalafil (CIALIS) 20 MG tablet 1 tablet    ? testosterone cypionate (DEPOTESTOSTERONE CYPIONATE) 200 MG/ML injection SMARTSIG:0.5 Milliliter(s) IM Once a Week    ? testosterone cypionate (DEPOTESTOSTERONE CYPIONATE) 200 MG/ML injection once a week.    ? testosterone cypionate (DEPOTESTOTERONE CYPIONATE) 100 MG/ML injection 0.47m    ? ?No current facility-administered medications on file prior to visit.  ? ? ?Allergies  ?Allergen Reactions  ? Caffeine Other (See Comments)  ?  Urinary retention and jittery  ? Codeine Other (See Comments)  ?  Jittery  ? Iodinated Contrast Media Other (See Comments)  ?  Hard on kidneys. The blue dye that went through my IV.   ? Pseudoephedrine Other (See Comments)  ?  Urinary retention  ? Simvastatin Other (See Comments)  ?  Hurt my bones  ? Penicillins Itching  ? ? ?Objective: ?Physical Exam ? ?General: Well developed, nourished, no acute distress, awake, alert and oriented x 3 ? ?Vascular: Dorsalis pedis artery 1/4 bilateral, Posterior tibial artery 0/4 bilateral, due to trace edema on ankles.  Skin temperature warm to warm proximal to distal bilateral lower extremities, scant varicosities, scant pedal hair present bilateral. ? ?Neurological: Gross sensation present via light touch bilateral.  ? ?Dermatological: Skin is warm, dry, and supple  bilateral, Nails 1-10 are tender, long, thick, and discolored with mild subungal debris there is mild incurvation at the lateral greater than medial nail border of the left great toe.  No open lesions present bilateral, no callus/corns/hyperkeratotic tissue present bilateral. No signs of infection bilateral. ? ?Musculoskeletal: Asymptomatic bunion and hammertoe boney deformities noted bilateral. Muscular strength within normal limits without painon range of motion. No pain with calf compression bilateral. ? ?Assessment and Plan:  ?Problem List Items Addressed This Visit   ?None ?Visit Diagnoses   ? ? Pain due to onychomycosis of toenails of both feet    -  Primary  ? PVD (peripheral vascular disease) ([place]      ? Relevant Medications  ? doxazosin (CARDURA) 2 MG tablet  ? tadalafil (CIALIS) 20  MG tablet  ? ?  ? ? ?-Examined patient.  ?-Mechanically debrided and reduced mycotic nails with sterile nail nipper and dremel nail file without incident. ?-Also trimmed out ingrowing corner at the left hallux lateral nail fold.  Advised patient in the future if he keeps having problems with the corner growing in and being sore may benefit from nail avulsion procedure at a future visit however at this time he reports that previous nail trims seem to help ?-Return to office 10 to 12 weeks for nail trim or sooner if problems or issues arise ? ?Landis Martins, DPM ? ?

## 2022-01-03 ENCOUNTER — Encounter (INDEPENDENT_AMBULATORY_CARE_PROVIDER_SITE_OTHER): Payer: Medicare Other | Admitting: Ophthalmology

## 2022-01-03 DIAGNOSIS — H353111 Nonexudative age-related macular degeneration, right eye, early dry stage: Secondary | ICD-10-CM | POA: Diagnosis not present

## 2022-01-03 DIAGNOSIS — I10 Essential (primary) hypertension: Secondary | ICD-10-CM

## 2022-01-03 DIAGNOSIS — H43813 Vitreous degeneration, bilateral: Secondary | ICD-10-CM

## 2022-01-03 DIAGNOSIS — H353221 Exudative age-related macular degeneration, left eye, with active choroidal neovascularization: Secondary | ICD-10-CM

## 2022-01-03 DIAGNOSIS — D3132 Benign neoplasm of left choroid: Secondary | ICD-10-CM | POA: Diagnosis not present

## 2022-01-03 DIAGNOSIS — H2513 Age-related nuclear cataract, bilateral: Secondary | ICD-10-CM | POA: Diagnosis not present

## 2022-01-03 DIAGNOSIS — H35033 Hypertensive retinopathy, bilateral: Secondary | ICD-10-CM

## 2022-01-17 DIAGNOSIS — H25812 Combined forms of age-related cataract, left eye: Secondary | ICD-10-CM | POA: Diagnosis not present

## 2022-01-28 ENCOUNTER — Ambulatory Visit (INDEPENDENT_AMBULATORY_CARE_PROVIDER_SITE_OTHER): Payer: Medicare Other | Admitting: Allergy

## 2022-01-28 ENCOUNTER — Encounter: Payer: Self-pay | Admitting: Allergy

## 2022-01-28 VITALS — BP 104/62 | HR 82 | Resp 16

## 2022-01-28 DIAGNOSIS — J310 Chronic rhinitis: Secondary | ICD-10-CM | POA: Diagnosis not present

## 2022-01-28 DIAGNOSIS — J4520 Mild intermittent asthma, uncomplicated: Secondary | ICD-10-CM | POA: Diagnosis not present

## 2022-01-28 NOTE — Progress Notes (Signed)
? ? ?Follow-up Note ? ?RE: [name] MRN: [id number] DOB: 1946-08-10 ?Date of Office Visit: 01/28/2022 ? ? ?History of present illness: ?[name] is a 76 y.o. male presenting today for follow-up of nonallergic rhinitis and reactive airway.  He was last seen in the office on 07/30/2021 by myself. ? ?He states he has been doing ok since last visit.  ? ?He did see a pulmonologist with [facility] Dr. Camillo Flaming on 09/18/2021 for persistent cough.  He underwent chest CT and full PFTs that per Dr. Camillo Flaming had a FEV1 of 59% with a questionable COPD/fixed obstructive pattern.  Chest CT was largely unremarkable he had been trialed on Breztri but there was concern for urinary retention thus he was switched to Symbicort.  He has been advised that he can do his Symbicort and as needed basis. ? ?Last week he states he did use symbicort one night before bed.  He states the next morning he had a bad taste in mouth and heartburn symptoms and felt quite short of breath.   He states he had another day after where he used the symbicort and next morning had heartburn symptoms and shortness of breath again.  He states he was out of albuterol hence why he used the symbicort.  He states he had not eaten anything differently or taken any other new medications thus he could not figure out what was driving symptoms other than symbicort.  He does he does not want to use Symbicort any further due to this.  He has not had issues related to albuterol use. ? ?He states when he is outside like doing yard work he will use a mask.  He feels like the Xyzal is still helpful in controlling his allergy symptoms.  He has run out of the [place] and would like another sample since he is out of the [place] he is using Flonase.  He is also using Astelin as needed for nasal drainage.  He states the Astelin works for nasal drainage control. ? ?Review of systems: ?Review of Systems  ?Constitutional: Negative.   ?HENT: Negative.    ?Eyes: Negative.   ?Respiratory:   Positive for cough, chest tightness and shortness of breath.   ?Cardiovascular: Negative.   ?Gastrointestinal:   ?     See HPI  ?Musculoskeletal: Negative.   ?Skin: Negative.   ?Allergic/Immunologic: Negative.   ?Neurological: Negative.    ? ?All other systems negative unless noted above in HPI ? ?Past medical/social/surgical/family history have been reviewed and are unchanged unless specifically indicated below. ? ?No changes ? ?Medication List: ?Current Outpatient Medications  ?Medication Sig Dispense Refill  ? albuterol (PROAIR HFA) 108 (90 Base) MCG/ACT inhaler Inhale 2 puffs into the lungs every 4 (four) hours as needed for wheezing or shortness of breath. 1 each 3  ? allopurinol (ZYLOPRIM) 100 MG tablet 2 tablet    ? azelastine (ASTELIN) 0.1 % nasal spray Use two sprays in each nostril twice daily as directed for nasal drainage. 30 mL 5  ? Cholecalciferol (VITAMIN D3 PO) Take by mouth daily.    ? doxazosin (CARDURA) 2 MG tablet 1 tablet    ? Fluticasone Propionate (XHANCE) 93 MCG/ACT EXHU Place into the nose.    ? furosemide (LASIX) 40 MG tablet Take 40 mg by mouth daily.  3  ? gentamicin (GARAMYCIN) 0.3 % ophthalmic solution Use 1 drop 2x a day to affected eye    ? HYDROcodone bit-homatropine (HYCODAN) 5-1.5 MG/5ML syrup Take 5 mLs by  mouth every 6 (six) hours as needed.    ? HYDROcodone-acetaminophen (NORCO) 10-325 MG tablet Take 1 tablet by mouth 3 (three) times daily as needed.  0  ? hydroxychloroquine (PLAQUENIL) 200 MG tablet 1 tablet with food or milk    ? ketorolac (ACULAR) 0.5 % ophthalmic solution Place into the left eye.    ? levocetirizine (XYZAL) 5 MG tablet 1 tablet in the evening    ? methocarbamol (ROBAXIN) 500 MG tablet Take 500 mg by mouth 3 (three) times daily as needed.    ? ofloxacin (OCUFLOX) 0.3 % ophthalmic solution 2 drops. Uses for 3 days after injection.    ? omeprazole (PRILOSEC) 20 MG capsule 1 capsule    ? oxybutynin (DITROPAN) 5 MG tablet Take by mouth.    ?  oxyCODONE-acetaminophen (PERCOCET) 10-325 MG tablet Percocet 10 mg-325 mg tablet ? Take 1 tablet 3 times a day by oral route as needed for pain.    ? potassium chloride (KLOR-CON M10) 10 MEQ tablet 1 tablet with food    ? potassium citrate (UROCIT-K) 10 MEQ (1080 MG) SR tablet Take by mouth.    ? SYMBICORT 80-4.5 MCG/ACT inhaler SMARTSIG:2 Puff(s) By Mouth Twice Daily    ? tadalafil (CIALIS) 20 MG tablet 1 tablet    ? testosterone cypionate (DEPOTESTOTERONE CYPIONATE) 100 MG/ML injection 0.64m    ? ?No current facility-administered medications for this visit.  ?  ? ?Known medication allergies: ?Allergies  ?Allergen Reactions  ? Caffeine Other (See Comments)  ?  Urinary retention and jittery  ? Codeine Other (See Comments)  ?  Jittery  ? Iodinated Contrast Media Other (See Comments)  ?  Hard on kidneys. The blue dye that went through my IV.   ? Pseudoephedrine Other (See Comments)  ?  Urinary retention  ? Simvastatin Other (See Comments)  ?  Hurt my bones  ? Penicillins Itching  ? ? ? ?Physical examination: ?Blood pressure 104/62, pulse 82, resp. rate 16, SpO2 95 %. ? ?General: Alert, interactive, in no acute distress. ?HEENT: PERRLA, TMs pearly gray, turbinates minimally edematous without discharge, post-pharynx non erythematous. ?Neck: Supple without lymphadenopathy. ?Lungs: Clear to auscultation without wheezing, rhonchi or rales. {no increased work of breathing. ?CV: Normal S1, S2 without murmurs. ?Abdomen: Nondistended, nontender. ?Skin: Warm and dry, without lesions or rashes. ?Extremities:  No clubbing, cyanosis or edema. ?Neuro:   Grossly intact. ? ?Diagnositics/Labs: ? ?Spirometry: FEV1: 1.63 L 56%, FVC: 2.69 L 68% predicted.  This appears to be relatively stable from his previous study done by pulmonologist ? ?Assessment and plan: ?  ?Rhinitis, non-allergic ?   - continue xyzal '5mg'$  daily at this time ?   - continue use of Xhance 2 sprays twice a day as needed for nasal congestion control.   ?   - if XTruett Perna is not available can use Flonase 2 sprays each nostril daily for 1-2 weeks at a time before stopping once nasal congestion improves for maximum benefit ?   - for nasal drainage/runny nose recommend use of nasal antihistamine, Astelin, 2 sprays each nostril twice a day  ? ?Reactive airway ?  - have access to Ventolin (albuterol) inhaler 2 puffs every 4-6 hours as needed for cough/wheeze/shortness of breath/chest tightness.  May use 15-20 minutes prior to activity.   Monitor frequency of use.   ?  - use inhaler with spacer chamber  ? ?Asthma control goals:  ?Full participation in all desired activities (may need albuterol before activity) ?Albuterol use two  time or less a week on average (not counting use with activity) ?Cough interfering with sleep two time or less a month ?Oral steroids no more than once a year ?No hospitalizations ? ?Follow-up 6 months or sooner if needed ? ?I appreciate the opportunity to take part in Manly's care. Please do not hesitate to contact me with questions. ? ?Sincerely, ? ? ?Prudy Feeler, MD ?Allergy/Immunology ?[facility] ? ? ?

## 2022-01-28 NOTE — Addendum Note (Signed)
Addended by: Guy Franco on: 01/28/2022 05:03 PM ? ? Modules accepted: Orders ? ?

## 2022-01-28 NOTE — Patient Instructions (Addendum)
Rhinitis, non-allergic ?   - continue xyzal '5mg'$  daily at this time ?   - continue use of Xhance 2 sprays twice a day as needed for nasal congestion control.   ?   - if Truett Perna is not available can use Flonase 2 sprays each nostril daily for 1-2 weeks at a time before stopping once nasal congestion improves for maximum benefit ?   - for nasal drainage/runny nose recommend use of nasal antihistamine, Astelin, 2 sprays each nostril twice a day  ? ?Reactive airway ?  - have access to Ventolin (albuterol) inhaler 2 puffs every 4-6 hours as needed for cough/wheeze/shortness of breath/chest tightness.  May use 15-20 minutes prior to activity.   Monitor frequency of use.   ?  - use inhaler with spacer chamber  ? ?Asthma control goals:  ?Full participation in all desired activities (may need albuterol before activity) ?Albuterol use two time or less a week on average (not counting use with activity) ?Cough interfering with sleep two time or less a month ?Oral steroids no more than once a year ?No hospitalizations ? ?Follow-up 6 months or sooner if needed ?

## 2022-01-29 DIAGNOSIS — M47812 Spondylosis without myelopathy or radiculopathy, cervical region: Secondary | ICD-10-CM | POA: Diagnosis not present

## 2022-01-29 DIAGNOSIS — M5416 Radiculopathy, lumbar region: Secondary | ICD-10-CM | POA: Insufficient documentation

## 2022-01-29 DIAGNOSIS — M5136 Other intervertebral disc degeneration, lumbar region: Secondary | ICD-10-CM | POA: Diagnosis not present

## 2022-01-29 DIAGNOSIS — M5459 Other low back pain: Secondary | ICD-10-CM | POA: Diagnosis not present

## 2022-01-29 DIAGNOSIS — M5030 Other cervical disc degeneration, unspecified cervical region: Secondary | ICD-10-CM | POA: Diagnosis not present

## 2022-01-30 DIAGNOSIS — R140 Abdominal distension (gaseous): Secondary | ICD-10-CM | POA: Diagnosis not present

## 2022-01-30 DIAGNOSIS — H2511 Age-related nuclear cataract, right eye: Secondary | ICD-10-CM | POA: Diagnosis not present

## 2022-01-30 DIAGNOSIS — R1013 Epigastric pain: Secondary | ICD-10-CM | POA: Diagnosis not present

## 2022-01-30 DIAGNOSIS — R109 Unspecified abdominal pain: Secondary | ICD-10-CM | POA: Diagnosis not present

## 2022-01-30 DIAGNOSIS — K5900 Constipation, unspecified: Secondary | ICD-10-CM | POA: Diagnosis not present

## 2022-01-30 DIAGNOSIS — R1310 Dysphagia, unspecified: Secondary | ICD-10-CM | POA: Diagnosis not present

## 2022-02-05 ENCOUNTER — Encounter (INDEPENDENT_AMBULATORY_CARE_PROVIDER_SITE_OTHER): Payer: Medicare Other | Admitting: Ophthalmology

## 2022-02-05 DIAGNOSIS — D3132 Benign neoplasm of left choroid: Secondary | ICD-10-CM

## 2022-02-05 DIAGNOSIS — I10 Essential (primary) hypertension: Secondary | ICD-10-CM | POA: Diagnosis not present

## 2022-02-05 DIAGNOSIS — H35033 Hypertensive retinopathy, bilateral: Secondary | ICD-10-CM | POA: Diagnosis not present

## 2022-02-05 DIAGNOSIS — H353221 Exudative age-related macular degeneration, left eye, with active choroidal neovascularization: Secondary | ICD-10-CM

## 2022-02-05 DIAGNOSIS — H43813 Vitreous degeneration, bilateral: Secondary | ICD-10-CM | POA: Diagnosis not present

## 2022-02-05 DIAGNOSIS — H353111 Nonexudative age-related macular degeneration, right eye, early dry stage: Secondary | ICD-10-CM

## 2022-02-20 DIAGNOSIS — Z79891 Long term (current) use of opiate analgesic: Secondary | ICD-10-CM | POA: Diagnosis not present

## 2022-02-20 DIAGNOSIS — M5416 Radiculopathy, lumbar region: Secondary | ICD-10-CM | POA: Diagnosis not present

## 2022-02-26 ENCOUNTER — Telehealth: Payer: Self-pay | Admitting: *Deleted

## 2022-02-26 MED ORDER — FLUTICASONE PROPIONATE 50 MCG/ACT NA SUSP: 2.0000 | Freq: Every day | NASAL | 5 refills | Status: DC | PRN

## 2022-02-26 NOTE — Telephone Encounter (Signed)
Pt called and would like Flonase sent to [facility]. Rx sent.

## 2022-02-27 DIAGNOSIS — J209 Acute bronchitis, unspecified: Secondary | ICD-10-CM | POA: Diagnosis not present

## 2022-02-27 DIAGNOSIS — J0100 Acute maxillary sinusitis, unspecified: Secondary | ICD-10-CM | POA: Diagnosis not present

## 2022-03-05 DIAGNOSIS — M109 Gout, unspecified: Secondary | ICD-10-CM | POA: Diagnosis not present

## 2022-03-05 DIAGNOSIS — E291 Testicular hypofunction: Secondary | ICD-10-CM | POA: Diagnosis not present

## 2022-03-05 DIAGNOSIS — Z0000 Encounter for general adult medical examination without abnormal findings: Secondary | ICD-10-CM | POA: Diagnosis not present

## 2022-03-05 DIAGNOSIS — E785 Hyperlipidemia, unspecified: Secondary | ICD-10-CM | POA: Diagnosis not present

## 2022-03-05 DIAGNOSIS — R7989 Other specified abnormal findings of blood chemistry: Secondary | ICD-10-CM | POA: Diagnosis not present

## 2022-03-05 DIAGNOSIS — Z125 Encounter for screening for malignant neoplasm of prostate: Secondary | ICD-10-CM | POA: Diagnosis not present

## 2022-03-05 DIAGNOSIS — E559 Vitamin D deficiency, unspecified: Secondary | ICD-10-CM | POA: Diagnosis not present

## 2022-03-12 DIAGNOSIS — R82998 Other abnormal findings in urine: Secondary | ICD-10-CM | POA: Diagnosis not present

## 2022-03-12 DIAGNOSIS — E785 Hyperlipidemia, unspecified: Secondary | ICD-10-CM | POA: Diagnosis not present

## 2022-03-12 DIAGNOSIS — Z0000 Encounter for general adult medical examination without abnormal findings: Secondary | ICD-10-CM | POA: Diagnosis not present

## 2022-03-12 DIAGNOSIS — J45909 Unspecified asthma, uncomplicated: Secondary | ICD-10-CM | POA: Diagnosis not present

## 2022-03-12 DIAGNOSIS — N1832 Chronic kidney disease, stage 3b: Secondary | ICD-10-CM | POA: Diagnosis not present

## 2022-03-12 DIAGNOSIS — I129 Hypertensive chronic kidney disease with stage 1 through stage 4 chronic kidney disease, or unspecified chronic kidney disease: Secondary | ICD-10-CM | POA: Diagnosis not present

## 2022-03-12 DIAGNOSIS — Z23 Encounter for immunization: Secondary | ICD-10-CM | POA: Diagnosis not present

## 2022-03-12 DIAGNOSIS — M5380 Other specified dorsopathies, site unspecified: Secondary | ICD-10-CM | POA: Diagnosis not present

## 2022-03-12 DIAGNOSIS — E669 Obesity, unspecified: Secondary | ICD-10-CM | POA: Diagnosis not present

## 2022-03-12 DIAGNOSIS — E291 Testicular hypofunction: Secondary | ICD-10-CM | POA: Diagnosis not present

## 2022-03-12 DIAGNOSIS — D751 Secondary polycythemia: Secondary | ICD-10-CM | POA: Diagnosis not present

## 2022-03-12 DIAGNOSIS — M069 Rheumatoid arthritis, unspecified: Secondary | ICD-10-CM | POA: Diagnosis not present

## 2022-03-12 DIAGNOSIS — M109 Gout, unspecified: Secondary | ICD-10-CM | POA: Diagnosis not present

## 2022-03-14 ENCOUNTER — Encounter (INDEPENDENT_AMBULATORY_CARE_PROVIDER_SITE_OTHER): Payer: Medicare Other | Admitting: Ophthalmology

## 2022-03-14 DIAGNOSIS — H353111 Nonexudative age-related macular degeneration, right eye, early dry stage: Secondary | ICD-10-CM | POA: Diagnosis not present

## 2022-03-14 DIAGNOSIS — D3132 Benign neoplasm of left choroid: Secondary | ICD-10-CM

## 2022-03-14 DIAGNOSIS — H35033 Hypertensive retinopathy, bilateral: Secondary | ICD-10-CM | POA: Diagnosis not present

## 2022-03-14 DIAGNOSIS — H43813 Vitreous degeneration, bilateral: Secondary | ICD-10-CM

## 2022-03-14 DIAGNOSIS — I10 Essential (primary) hypertension: Secondary | ICD-10-CM

## 2022-03-14 DIAGNOSIS — H353221 Exudative age-related macular degeneration, left eye, with active choroidal neovascularization: Secondary | ICD-10-CM | POA: Diagnosis not present

## 2022-03-14 DIAGNOSIS — H2511 Age-related nuclear cataract, right eye: Secondary | ICD-10-CM | POA: Diagnosis not present

## 2022-03-19 ENCOUNTER — Other Ambulatory Visit: Payer: Self-pay | Admitting: Allergy

## 2022-03-26 ENCOUNTER — Other Ambulatory Visit: Payer: Self-pay

## 2022-03-26 ENCOUNTER — Inpatient Hospital Stay: Payer: Medicare Other | Attending: Hematology & Oncology

## 2022-03-26 ENCOUNTER — Encounter: Payer: Self-pay | Admitting: Hematology & Oncology

## 2022-03-26 ENCOUNTER — Inpatient Hospital Stay (HOSPITAL_BASED_OUTPATIENT_CLINIC_OR_DEPARTMENT_OTHER): Payer: Medicare Other | Admitting: Hematology & Oncology

## 2022-03-26 ENCOUNTER — Other Ambulatory Visit: Payer: Self-pay | Admitting: *Deleted

## 2022-03-26 VITALS — BP 130/71 | HR 92 | Temp 98.1°F | Resp 18 | Ht 71.0 in | Wt 208.0 lb

## 2022-03-26 DIAGNOSIS — R5383 Other fatigue: Secondary | ICD-10-CM | POA: Diagnosis not present

## 2022-03-26 DIAGNOSIS — D510 Vitamin B12 deficiency anemia due to intrinsic factor deficiency: Secondary | ICD-10-CM | POA: Diagnosis not present

## 2022-03-26 DIAGNOSIS — D500 Iron deficiency anemia secondary to blood loss (chronic): Secondary | ICD-10-CM | POA: Diagnosis not present

## 2022-03-26 DIAGNOSIS — I129 Hypertensive chronic kidney disease with stage 1 through stage 4 chronic kidney disease, or unspecified chronic kidney disease: Secondary | ICD-10-CM

## 2022-03-26 DIAGNOSIS — D751 Secondary polycythemia: Secondary | ICD-10-CM

## 2022-03-26 DIAGNOSIS — Z79899 Other long term (current) drug therapy: Secondary | ICD-10-CM | POA: Insufficient documentation

## 2022-03-26 LAB — CMP (CANCER CENTER ONLY)
ALT: 10 U/L (ref 0–44)
AST: 10 U/L — ABNORMAL LOW (ref 15–41)
Albumin: 3.9 g/dL (ref 3.5–5.0)
Alkaline Phosphatase: 57 U/L (ref 38–126)
Anion gap: 7 (ref 5–15)
BUN: 23 mg/dL (ref 8–23)
CO2: 31 mmol/L (ref 22–32)
Calcium: 9.6 mg/dL (ref 8.9–10.3)
Chloride: 102 mmol/L (ref 98–111)
Creatinine: 1.87 mg/dL — ABNORMAL HIGH (ref 0.61–1.24)
GFR, Estimated: 36 mL/min — ABNORMAL LOW (ref >60)
Glucose, Bld: 95 mg/dL (ref 70–99)
Potassium: 4.2 mmol/L (ref 3.5–5.1)
Sodium: 140 mmol/L (ref 135–145)
Total Bilirubin: 1.1 mg/dL (ref 0.3–1.2)
Total Protein: 6.2 g/dL — ABNORMAL LOW (ref 6.5–8.1)

## 2022-03-26 LAB — CBC WITH DIFFERENTIAL (CANCER CENTER ONLY)
Abs Immature Granulocytes: 0.02 10*3/uL (ref 0.00–0.07)
Basophils Absolute: 0.1 10*3/uL (ref 0.0–0.1)
Basophils Relative: 1 %
Eosinophils Absolute: 0.1 10*3/uL (ref 0.0–0.5)
Eosinophils Relative: 1 %
HCT: 40.4 % (ref 39.0–52.0)
Hemoglobin: 11.4 g/dL — ABNORMAL LOW (ref 13.0–17.0)
Immature Granulocytes: 0 %
Lymphocytes Relative: 22 %
Lymphs Abs: 1.7 10*3/uL (ref 0.7–4.0)
MCH: 21.3 pg — ABNORMAL LOW (ref 26.0–34.0)
MCHC: 28.2 g/dL — ABNORMAL LOW (ref 30.0–36.0)
MCV: 75.4 fL — ABNORMAL LOW (ref 80.0–100.0)
Monocytes Absolute: 1 10*3/uL (ref 0.1–1.0)
Monocytes Relative: 12 %
Neutro Abs: 5 10*3/uL (ref 1.7–7.7)
Neutrophils Relative %: 64 %
Platelet Count: 261 10*3/uL (ref 150–400)
RBC: 5.36 MIL/uL (ref 4.22–5.81)
RDW: 19.6 % — ABNORMAL HIGH (ref 11.5–15.5)
WBC Count: 7.7 10*3/uL (ref 4.0–10.5)
nRBC: 0 % (ref 0.0–0.2)

## 2022-03-26 LAB — FERRITIN: Ferritin: 4 ng/mL — ABNORMAL LOW (ref 24–336)

## 2022-03-26 NOTE — Progress Notes (Addendum)
Hematology and Oncology Follow Up Visit  [name] [id number] [birth year] 76 y.o. 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 IV iron-Monoferric given on 03/31/2022     Interim History:  [name] is back for follow-up.  He does feel a little more tired.  I suspect this probably is because he is iron deficient now.  When we last saw him, his ferritin was 6 with an iron saturation of 4%.  He donated blood a couple weeks ago.  I told him to stop donating blood right now.  He wants no if he can get B12.  I told we have to check his B12 level to see if he qualifies.  I told him to take some over-the-counter iron pills.  This will help with some inhaving more stamina.  He has had no problems with bleeding.  There is no change in bowel or bladder habits.  He has had no rashes.  There is been no leg swelling.  He is back to taking testosterone injections.  He says this helps his arthritis.  Overall, I would say his performance status right now is ECOG 1.    Medications:  Current Outpatient Medications:    albuterol (VENTOLIN HFA) 108 (90 Base) MCG/ACT inhaler, INHALE 2 PUFFS BY MOUTH EVERY 4 TO 6 HOURS AS NEEDED FOR COUGH OR WHEEZING, Disp: 7 each, Rfl: 0   allopurinol (ZYLOPRIM) 100 MG tablet, 2 tablet, Disp: , Rfl:    azelastine (ASTELIN) 0.1 % nasal spray, Use two sprays in each nostril twice daily as directed for nasal drainage., Disp: 30 mL, Rfl: 5   Cholecalciferol (VITAMIN D3 Moyer), Take by mouth daily., Disp: , Rfl:    doxazosin (CARDURA) 2 MG tablet, 1 tablet, Disp: , Rfl:    fluticasone (FLONASE) 50 MCG/ACT nasal spray, Place 2 sprays into both nostrils daily as needed (use for 1-2 weeks at a time.)., Disp: 16 g, Rfl: 5   Fluticasone Propionate (XHANCE) 93 MCG/ACT EXHU, Place into the nose., Disp: , Rfl:    furosemide  (LASIX) 40 MG tablet, Take 40 mg by mouth daily., Disp: , Rfl: 3   levocetirizine (XYZAL) 5 MG tablet, 1 tablet in the evening, Disp: , Rfl:    methocarbamol (ROBAXIN) 500 MG tablet, Take 500 mg by mouth 3 (three) times daily as needed., Disp: , Rfl:    omeprazole (PRILOSEC) 20 MG capsule, 1 capsule, Disp: , Rfl:    oxybutynin (DITROPAN) 5 MG tablet, Take by mouth., Disp: , Rfl:    potassium citrate (UROCIT-K) 10 MEQ (1080 MG) SR tablet, Take by mouth., Disp: , Rfl:    SYMBICORT 80-4.5 MCG/ACT inhaler, SMARTSIG:2 Puff(s) By Mouth Twice Daily, Disp: , Rfl:    tadalafil (CIALIS) 20 MG tablet, 1 tablet, Disp: , Rfl:    testosterone cypionate (DEPOTESTOTERONE CYPIONATE) 100 MG/ML injection, 0.63m, Disp: , Rfl:    gentamicin (GARAMYCIN) 0.3 % ophthalmic solution, Use 1 drop 2x a day to affected eye (Patient not taking: Reported on 03/26/2022), Disp: , Rfl:    ketorolac (ACULAR) 0.5 % ophthalmic solution, Place into the left eye. (Patient not taking: Reported on 03/26/2022), Disp: , Rfl:    ofloxacin (OCUFLOX) 0.3 % ophthalmic solution, 2 drops. Uses for 3 days after injection. (Patient not taking: Reported on 03/26/2022), Disp: , Rfl:   Allergies:  Allergies  Allergen Reactions   Caffeine Other (  See Comments)    Urinary retention and jittery   Codeine Other (See Comments)    Jittery   Iodinated Contrast Media Other (See Comments)    Hard on kidneys. The blue dye that went through my IV.    Pseudoephedrine Other (See Comments)    Urinary retention   Simvastatin Other (See Comments)    Hurt my bones   Penicillins Itching    Past Medical History, Surgical history, Social history, and Family History were reviewed and updated.  Review of Systems: Review of Systems  Constitutional: Negative.   HENT:  Negative.    Eyes: Negative.   Respiratory: Negative.    Cardiovascular: Negative.   Gastrointestinal: Negative.   Endocrine: Negative.   Genitourinary: Negative.    Musculoskeletal: Negative.    Skin: Negative.   Neurological: Negative.   Hematological: Negative.   Psychiatric/Behavioral: Negative.      Physical Exam:  height is '5\' 11"'$  (1.803 m) and weight is 208 lb (94.3 kg). His oral temperature is 98.1 F (36.7 C). His blood pressure is 130/71 and his pulse is 92. His respiration is 18 and oxygen saturation is 96%.   Wt Readings from Last 3 Encounters:  03/26/22 208 lb (94.3 kg)  09/25/21 219 lb 1.9 oz (99.4 kg)  07/17/21 221 lb (100.2 kg)    Physical Exam Vitals reviewed.  HENT:     Head: Normocephalic and atraumatic.  Eyes:     Pupils: Pupils are equal, round, and reactive to light.  Cardiovascular:     Rate and Rhythm: Normal rate and regular rhythm.     Heart sounds: Normal heart sounds.  Pulmonary:     Effort: Pulmonary effort is normal.     Breath sounds: Normal breath sounds.  Abdominal:     General: Bowel sounds are normal.     Palpations: Abdomen is soft.  Musculoskeletal:        General: No tenderness or deformity. Normal range of motion.     Cervical back: Normal range of motion.  Lymphadenopathy:     Cervical: No cervical adenopathy.  Skin:    General: Skin is warm and dry.     Findings: No erythema or rash.  Neurological:     Mental Status: He is alert and oriented to person, place, and time.  Psychiatric:        Behavior: Behavior normal.        Thought Content: Thought content normal.        Judgment: Judgment normal.      Lab Results  Component Value Date   WBC 7.7 03/26/2022   HGB 11.4 (L) 03/26/2022   HCT 40.4 03/26/2022   MCV 75.4 (L) 03/26/2022   PLT 261 03/26/2022     Chemistry      Component Value Date/Time   NA 143 09/25/2021 1310   K 4.3 09/25/2021 1310   CL 104 09/25/2021 1310   CO2 30 09/25/2021 1310   BUN 25 (H) 09/25/2021 1310   CREATININE 1.72 (H) 09/25/2021 1310      Component Value Date/Time   CALCIUM 10.0 09/25/2021 1310   ALKPHOS 52 09/25/2021 1310   AST 13 (L) 09/25/2021 1310   ALT 9 09/25/2021  1310   BILITOT 1.0 09/25/2021 1310      Impression and Plan: [name] is a very nice 76 year old white male.  He has secondary polycythemia.  He is on testosterone injections which he needs.  Again, he will stop donating blood.  I told him  not to donate blood until we see him back.  His iron levels are on the low side.  I am sure this is why he does have some fatigue.  His ferritin is only 4 with an iron saturation of 4%.  As such, he does need iron.  He does not wish to have oral iron.  I will going give a dose of IV iron.  I would think that his B12 level should be okay.  However, if it is low, we can get him back for B12 injections.  I would like to see him back in 4 months.  I think going to have to get him back a little bit more often just to follow-up with the labs.   Volanda Napoleon, MD 6/21/20231:41 PM

## 2022-03-27 ENCOUNTER — Encounter: Payer: Self-pay | Admitting: Hematology & Oncology

## 2022-03-27 DIAGNOSIS — D500 Iron deficiency anemia secondary to blood loss (chronic): Secondary | ICD-10-CM | POA: Insufficient documentation

## 2022-03-27 HISTORY — DX: Iron deficiency anemia secondary to blood loss (chronic): D50.0

## 2022-03-27 LAB — VITAMIN B12: Vitamin B-12: 200 pg/mL (ref 180–914)

## 2022-03-27 LAB — IRON AND IRON BINDING CAPACITY (CC-WL,HP ONLY)
Iron: 16 ug/dL — ABNORMAL LOW (ref 45–182)
Saturation Ratios: 4 % — ABNORMAL LOW (ref 17.9–39.5)
TIBC: 428 ug/dL (ref 250–450)
UIBC: 412 ug/dL — ABNORMAL HIGH (ref 117–376)

## 2022-03-27 NOTE — Addendum Note (Signed)
Addended by: Burney Gauze R on: 03/27/2022 12:10 PM   Modules accepted: Orders

## 2022-03-28 DIAGNOSIS — H25811 Combined forms of age-related cataract, right eye: Secondary | ICD-10-CM | POA: Diagnosis not present

## 2022-03-31 ENCOUNTER — Telehealth: Payer: Self-pay | Admitting: *Deleted

## 2022-03-31 ENCOUNTER — Telehealth: Payer: Self-pay | Admitting: Hematology & Oncology

## 2022-03-31 ENCOUNTER — Ambulatory Visit: Payer: Medicare Other

## 2022-04-02 ENCOUNTER — Inpatient Hospital Stay: Payer: Medicare Other

## 2022-04-02 VITALS — BP 128/61 | HR 71 | Temp 98.0°F | Resp 18

## 2022-04-02 DIAGNOSIS — R5383 Other fatigue: Secondary | ICD-10-CM | POA: Diagnosis not present

## 2022-04-02 DIAGNOSIS — D500 Iron deficiency anemia secondary to blood loss (chronic): Secondary | ICD-10-CM | POA: Diagnosis not present

## 2022-04-02 DIAGNOSIS — D751 Secondary polycythemia: Secondary | ICD-10-CM | POA: Diagnosis not present

## 2022-04-02 DIAGNOSIS — Z79899 Other long term (current) drug therapy: Secondary | ICD-10-CM | POA: Diagnosis not present

## 2022-04-02 MED ORDER — ACETAMINOPHEN 325 MG PO TABS
650.0000 mg | ORAL_TABLET | Freq: Once | ORAL | Status: DC
  Filled 2022-04-02: qty 2

## 2022-04-02 MED ORDER — SODIUM CHLORIDE 0.9 % IV SOLN
1000.0000 mg | Freq: Once | INTRAVENOUS | Status: AC
  Administered 2022-04-02: 1000 mg via INTRAVENOUS
  Filled 2022-04-02: qty 10

## 2022-04-02 MED ORDER — SODIUM CHLORIDE 0.9 % IV SOLN: Freq: Once | INTRAVENOUS | Status: AC

## 2022-04-02 MED ORDER — LORATADINE 10 MG PO TABS
10.0000 mg | ORAL_TABLET | Freq: Once | ORAL | Status: DC
  Filled 2022-04-02: qty 1

## 2022-04-02 NOTE — Patient Instructions (Signed)

## 2022-04-03 ENCOUNTER — Encounter: Payer: Self-pay | Admitting: Podiatry

## 2022-04-03 ENCOUNTER — Ambulatory Visit (INDEPENDENT_AMBULATORY_CARE_PROVIDER_SITE_OTHER): Payer: Medicare Other | Admitting: Podiatry

## 2022-04-03 DIAGNOSIS — M79675 Pain in left toe(s): Secondary | ICD-10-CM

## 2022-04-03 DIAGNOSIS — I739 Peripheral vascular disease, unspecified: Secondary | ICD-10-CM

## 2022-04-03 DIAGNOSIS — B351 Tinea unguium: Secondary | ICD-10-CM | POA: Diagnosis not present

## 2022-04-03 DIAGNOSIS — M79674 Pain in right toe(s): Secondary | ICD-10-CM

## 2022-04-09 DIAGNOSIS — R350 Frequency of micturition: Secondary | ICD-10-CM | POA: Diagnosis not present

## 2022-04-09 DIAGNOSIS — N5201 Erectile dysfunction due to arterial insufficiency: Secondary | ICD-10-CM | POA: Diagnosis not present

## 2022-04-09 DIAGNOSIS — E291 Testicular hypofunction: Secondary | ICD-10-CM | POA: Diagnosis not present

## 2022-04-09 DIAGNOSIS — N401 Enlarged prostate with lower urinary tract symptoms: Secondary | ICD-10-CM | POA: Diagnosis not present

## 2022-04-13 NOTE — Progress Notes (Signed)
  Subjective:  Patient ID: [name], male    DOB: 1946/03/29,  MRN: [id number]  [name] presents to clinic today for at risk foot care. Patient has h/o PAD and painful thick toenails that are difficult to trim. Pain interferes with ambulation. Aggravating factors include wearing enclosed shoe gear. Pain is relieved with periodic professional debridement.  Patient has chronic problem of ingrown toenails of both great toes. States they are sore when wearing enclosed shoe gear. Usually resolves with periodic debridement. Denies any redness, drainage or swelling today.  PCP is Avva, Ravisankar, MD , and last visit was  March 13, 2022  Allergies  Allergen Reactions   Caffeine Other (See Comments)    Urinary retention and jittery   Codeine Other (See Comments)    Jittery   Iodinated Contrast Media Other (See Comments)    Hard on kidneys. The blue dye that went through my IV.    Pseudoephedrine Other (See Comments)    Urinary retention   Simvastatin Other (See Comments)    Hurt my bones   Penicillins Itching   Chlorpheniramine-Pseudoeph     Review of Systems: Negative except as noted in the HPI.  Objective:  [name] is a pleasant 76 y.o. male, WD, WN in NAD. AAO x 3.  Vascular Examination: CFT <4 seconds b/l. DP pulses faintly palpable b/l. PT pulses diminished b/l.  Skin temperature gradient warm to warm b/l. No ischemia or gangrene. No cyanosis or clubbing noted b/l. Pedal hair sparse. Varicosities present b/l.   Neurological Examination: Sensation grossly intact b/l with 10 gram monofilament. Vibratory sensation intact b/l.   Dermatological Examination: Pedal skin warm and supple b/l. Toenails 1-5 b/l elongated, discolored, dystrophic, thickened, crumbly with subungual debris and tenderness to dorsal palpation. Incurvated nailplate lateral border left hallux and both borders of right hallux.  Nail border hypertrophy absent. There is tenderness to palpation. Sign(s)  of infection: no clinical signs of infection noted on examination today..  Musculoskeletal Examination: Muscle strength 5/5 to b/l LE. HAV with bunion bilaterally and hammertoes 2-5 b/l.  Radiographs: None  Assessment/Plan: 1. Pain due to onychomycosis of toenails of both feet   2. PVD (peripheral vascular disease) ([place])   -Patient was evaluated and treated. All patient's and/or POA's questions/concerns answered on today's visit. -Toenails 1-5 b/l were debrided in length and girth with sterile nail nippers and dremel without iatrogenic bleeding.  -Offending nail border debrided and curretaged lateral border left hallux and both borders of right hallux utilizing sterile nail nipper and currette. Border cleansed with alcohol and triple antibiotic applied. No further treatment required by patient/caregiver. Call office if there are any concerns. -Patient/POA to call should there be question/concern in the interim.   Return in about 3 months (around 07/04/2022).  Marzetta Board, DPM

## 2022-04-18 ENCOUNTER — Other Ambulatory Visit: Payer: Self-pay | Admitting: Allergy

## 2022-04-18 ENCOUNTER — Encounter (INDEPENDENT_AMBULATORY_CARE_PROVIDER_SITE_OTHER): Payer: Medicare Other | Admitting: Ophthalmology

## 2022-04-18 DIAGNOSIS — I10 Essential (primary) hypertension: Secondary | ICD-10-CM | POA: Diagnosis not present

## 2022-04-18 DIAGNOSIS — H353111 Nonexudative age-related macular degeneration, right eye, early dry stage: Secondary | ICD-10-CM

## 2022-04-18 DIAGNOSIS — H35033 Hypertensive retinopathy, bilateral: Secondary | ICD-10-CM | POA: Diagnosis not present

## 2022-04-18 DIAGNOSIS — H43813 Vitreous degeneration, bilateral: Secondary | ICD-10-CM

## 2022-04-18 DIAGNOSIS — D3132 Benign neoplasm of left choroid: Secondary | ICD-10-CM | POA: Diagnosis not present

## 2022-04-18 DIAGNOSIS — H353221 Exudative age-related macular degeneration, left eye, with active choroidal neovascularization: Secondary | ICD-10-CM | POA: Diagnosis not present

## 2022-05-05 DIAGNOSIS — J329 Chronic sinusitis, unspecified: Secondary | ICD-10-CM | POA: Diagnosis not present

## 2022-05-06 ENCOUNTER — Telehealth: Payer: Self-pay

## 2022-05-06 DIAGNOSIS — D500 Iron deficiency anemia secondary to blood loss (chronic): Secondary | ICD-10-CM

## 2022-05-06 NOTE — Telephone Encounter (Signed)
Pt called stating he was still feeling pretty fatigued and SOB, states he does have a sinus infection he got antibiotics for yesterday and is wondering if hes tired from that but wasnt to check his iron levels the end of this week to make sure. Lab appt scheduled.

## 2022-05-08 ENCOUNTER — Inpatient Hospital Stay: Payer: Medicare Other | Attending: Hematology & Oncology

## 2022-05-08 DIAGNOSIS — D500 Iron deficiency anemia secondary to blood loss (chronic): Secondary | ICD-10-CM | POA: Insufficient documentation

## 2022-05-08 DIAGNOSIS — D751 Secondary polycythemia: Secondary | ICD-10-CM | POA: Diagnosis not present

## 2022-05-08 LAB — CMP (CANCER CENTER ONLY)
ALT: 11 U/L (ref 0–44)
AST: 16 U/L (ref 15–41)
Albumin: 4.4 g/dL (ref 3.5–5.0)
Alkaline Phosphatase: 58 U/L (ref 38–126)
Anion gap: 7 (ref 5–15)
BUN: 30 mg/dL — ABNORMAL HIGH (ref 8–23)
CO2: 32 mmol/L (ref 22–32)
Calcium: 10.4 mg/dL — ABNORMAL HIGH (ref 8.9–10.3)
Chloride: 101 mmol/L (ref 98–111)
Creatinine: 1.94 mg/dL — ABNORMAL HIGH (ref 0.61–1.24)
GFR, Estimated: 35 mL/min — ABNORMAL LOW (ref >60)
Glucose, Bld: 81 mg/dL (ref 70–99)
Potassium: 4.4 mmol/L (ref 3.5–5.1)
Sodium: 140 mmol/L (ref 135–145)
Total Bilirubin: 1.1 mg/dL (ref 0.3–1.2)
Total Protein: 6.5 g/dL (ref 6.5–8.1)

## 2022-05-08 LAB — CBC WITH DIFFERENTIAL (CANCER CENTER ONLY)
Abs Immature Granulocytes: 0.03 10*3/uL (ref 0.00–0.07)
Basophils Absolute: 0 10*3/uL (ref 0.0–0.1)
Basophils Relative: 1 %
Eosinophils Absolute: 0 10*3/uL (ref 0.0–0.5)
Eosinophils Relative: 1 %
HCT: 49.5 % (ref 39.0–52.0)
Hemoglobin: 15 g/dL (ref 13.0–17.0)
Immature Granulocytes: 0 %
Lymphocytes Relative: 22 %
Lymphs Abs: 1.7 10*3/uL (ref 0.7–4.0)
MCH: 25.2 pg — ABNORMAL LOW (ref 26.0–34.0)
MCHC: 30.3 g/dL (ref 30.0–36.0)
MCV: 83.2 fL (ref 80.0–100.0)
Monocytes Absolute: 0.9 10*3/uL (ref 0.1–1.0)
Monocytes Relative: 12 %
Neutro Abs: 5 10*3/uL (ref 1.7–7.7)
Neutrophils Relative %: 64 %
Platelet Count: 219 10*3/uL (ref 150–400)
RBC: 5.95 MIL/uL — ABNORMAL HIGH (ref 4.22–5.81)
RDW: 24.6 % — ABNORMAL HIGH (ref 11.5–15.5)
WBC Count: 7.7 10*3/uL (ref 4.0–10.5)
nRBC: 0 % (ref 0.0–0.2)

## 2022-05-08 LAB — FERRITIN: Ferritin: 17 ng/mL — ABNORMAL LOW (ref 24–336)

## 2022-05-09 LAB — IRON AND IRON BINDING CAPACITY (CC-WL,HP ONLY)
Iron: 41 ug/dL — ABNORMAL LOW (ref 45–182)
Saturation Ratios: 10 % — ABNORMAL LOW (ref 17.9–39.5)
TIBC: 405 ug/dL (ref 250–450)
UIBC: 364 ug/dL (ref 117–376)

## 2022-05-15 ENCOUNTER — Telehealth: Payer: Self-pay | Admitting: *Deleted

## 2022-05-15 NOTE — Telephone Encounter (Signed)
This nurse returned patient's phone call regarding labs from 05/08/22. I reviewed his HGB, iron and iron binding,and ferritin. Per Dr. Marin Olp, he does not need IV iron (Monoferric). We are going to watch and observe lab levels. He verbalized understanding.

## 2022-05-16 DIAGNOSIS — J0100 Acute maxillary sinusitis, unspecified: Secondary | ICD-10-CM | POA: Diagnosis not present

## 2022-05-16 DIAGNOSIS — H9203 Otalgia, bilateral: Secondary | ICD-10-CM | POA: Diagnosis not present

## 2022-05-21 DIAGNOSIS — D751 Secondary polycythemia: Secondary | ICD-10-CM | POA: Diagnosis not present

## 2022-05-21 DIAGNOSIS — M109 Gout, unspecified: Secondary | ICD-10-CM | POA: Diagnosis not present

## 2022-05-21 DIAGNOSIS — I129 Hypertensive chronic kidney disease with stage 1 through stage 4 chronic kidney disease, or unspecified chronic kidney disease: Secondary | ICD-10-CM | POA: Diagnosis not present

## 2022-05-21 DIAGNOSIS — N2581 Secondary hyperparathyroidism of renal origin: Secondary | ICD-10-CM | POA: Diagnosis not present

## 2022-05-21 DIAGNOSIS — N209 Urinary calculus, unspecified: Secondary | ICD-10-CM | POA: Diagnosis not present

## 2022-05-21 DIAGNOSIS — N1832 Chronic kidney disease, stage 3b: Secondary | ICD-10-CM | POA: Diagnosis not present

## 2022-05-23 ENCOUNTER — Encounter (INDEPENDENT_AMBULATORY_CARE_PROVIDER_SITE_OTHER): Payer: Medicare Other | Admitting: Ophthalmology

## 2022-05-23 DIAGNOSIS — H353111 Nonexudative age-related macular degeneration, right eye, early dry stage: Secondary | ICD-10-CM

## 2022-05-23 DIAGNOSIS — H353221 Exudative age-related macular degeneration, left eye, with active choroidal neovascularization: Secondary | ICD-10-CM

## 2022-05-23 DIAGNOSIS — H43813 Vitreous degeneration, bilateral: Secondary | ICD-10-CM | POA: Diagnosis not present

## 2022-05-23 DIAGNOSIS — I10 Essential (primary) hypertension: Secondary | ICD-10-CM

## 2022-05-23 DIAGNOSIS — D3132 Benign neoplasm of left choroid: Secondary | ICD-10-CM | POA: Diagnosis not present

## 2022-05-23 DIAGNOSIS — H35033 Hypertensive retinopathy, bilateral: Secondary | ICD-10-CM | POA: Diagnosis not present

## 2022-06-05 DIAGNOSIS — M47812 Spondylosis without myelopathy or radiculopathy, cervical region: Secondary | ICD-10-CM | POA: Diagnosis not present

## 2022-06-05 DIAGNOSIS — I129 Hypertensive chronic kidney disease with stage 1 through stage 4 chronic kidney disease, or unspecified chronic kidney disease: Secondary | ICD-10-CM | POA: Diagnosis not present

## 2022-06-23 DIAGNOSIS — Z23 Encounter for immunization: Secondary | ICD-10-CM | POA: Diagnosis not present

## 2022-06-24 DIAGNOSIS — M1A09X1 Idiopathic chronic gout, multiple sites, with tophus (tophi): Secondary | ICD-10-CM | POA: Diagnosis not present

## 2022-06-24 DIAGNOSIS — M5136 Other intervertebral disc degeneration, lumbar region: Secondary | ICD-10-CM | POA: Diagnosis not present

## 2022-06-24 DIAGNOSIS — M0579 Rheumatoid arthritis with rheumatoid factor of multiple sites without organ or systems involvement: Secondary | ICD-10-CM | POA: Diagnosis not present

## 2022-06-24 DIAGNOSIS — Z6829 Body mass index (BMI) 29.0-29.9, adult: Secondary | ICD-10-CM | POA: Diagnosis not present

## 2022-06-24 DIAGNOSIS — M1991 Primary osteoarthritis, unspecified site: Secondary | ICD-10-CM | POA: Diagnosis not present

## 2022-06-24 DIAGNOSIS — E663 Overweight: Secondary | ICD-10-CM | POA: Diagnosis not present

## 2022-06-24 DIAGNOSIS — N1832 Chronic kidney disease, stage 3b: Secondary | ICD-10-CM | POA: Diagnosis not present

## 2022-06-27 ENCOUNTER — Encounter (INDEPENDENT_AMBULATORY_CARE_PROVIDER_SITE_OTHER): Payer: Medicare Other | Admitting: Ophthalmology

## 2022-06-27 DIAGNOSIS — H43813 Vitreous degeneration, bilateral: Secondary | ICD-10-CM

## 2022-06-27 DIAGNOSIS — H35033 Hypertensive retinopathy, bilateral: Secondary | ICD-10-CM | POA: Diagnosis not present

## 2022-06-27 DIAGNOSIS — H353221 Exudative age-related macular degeneration, left eye, with active choroidal neovascularization: Secondary | ICD-10-CM

## 2022-06-27 DIAGNOSIS — I10 Essential (primary) hypertension: Secondary | ICD-10-CM

## 2022-06-27 DIAGNOSIS — H353111 Nonexudative age-related macular degeneration, right eye, early dry stage: Secondary | ICD-10-CM | POA: Diagnosis not present

## 2022-06-27 DIAGNOSIS — D3132 Benign neoplasm of left choroid: Secondary | ICD-10-CM

## 2022-07-10 ENCOUNTER — Encounter: Payer: Self-pay | Admitting: Podiatry

## 2022-07-10 ENCOUNTER — Ambulatory Visit (INDEPENDENT_AMBULATORY_CARE_PROVIDER_SITE_OTHER): Payer: Medicare Other | Admitting: Podiatry

## 2022-07-10 DIAGNOSIS — M79675 Pain in left toe(s): Secondary | ICD-10-CM

## 2022-07-10 DIAGNOSIS — I739 Peripheral vascular disease, unspecified: Secondary | ICD-10-CM

## 2022-07-10 DIAGNOSIS — L821 Other seborrheic keratosis: Secondary | ICD-10-CM | POA: Diagnosis not present

## 2022-07-10 DIAGNOSIS — B351 Tinea unguium: Secondary | ICD-10-CM | POA: Diagnosis not present

## 2022-07-10 DIAGNOSIS — L820 Inflamed seborrheic keratosis: Secondary | ICD-10-CM | POA: Diagnosis not present

## 2022-07-10 DIAGNOSIS — D2239 Melanocytic nevi of other parts of face: Secondary | ICD-10-CM | POA: Diagnosis not present

## 2022-07-10 DIAGNOSIS — M79674 Pain in right toe(s): Secondary | ICD-10-CM | POA: Diagnosis not present

## 2022-07-10 DIAGNOSIS — L814 Other melanin hyperpigmentation: Secondary | ICD-10-CM | POA: Diagnosis not present

## 2022-07-10 DIAGNOSIS — L570 Actinic keratosis: Secondary | ICD-10-CM | POA: Diagnosis not present

## 2022-07-10 DIAGNOSIS — D485 Neoplasm of uncertain behavior of skin: Secondary | ICD-10-CM | POA: Diagnosis not present

## 2022-07-10 NOTE — Progress Notes (Signed)
  Subjective:  Patient ID: [name], male    DOB: 03-11-1946,  MRN: [id number]  [name] presents to clinic today for:  Chief Complaint  Patient presents with   Nail Problem    Routine foot care PCP-Avva,Ravisanker PCP [facility]-Summer 2023    New problem(s): None.   PCP is Avva, Steva Ready, MD , and last visit was  July 09, 2022.  Allergies  Allergen Reactions   Caffeine Other (See Comments)    Urinary retention and jittery   Codeine Other (See Comments)    Jittery   Iodinated Contrast Media Other (See Comments)    Hard on kidneys. The blue dye that went through my IV.    Pseudoephedrine Other (See Comments)    Urinary retention   Simvastatin Other (See Comments)    Hurt my bones   Penicillins Itching   Chlorpheniramine-Pseudoeph    Infliximab     Other reaction(s): ear infections    Review of Systems: Negative except as noted in the HPI.  Objective: No changes noted in today's physical examination.  [name] is a pleasant 76 y.o. male WD, WN in NAD. AAO x 3.  Vascular Examination: CFT <4 seconds b/l. DP pulses faintly palpable b/l. PT pulses diminished b/l.  Skin temperature gradient warm to warm b/l. No ischemia or gangrene. No cyanosis or clubbing noted b/l. Pedal hair sparse. Varicosities present b/l.   Neurological Examination: Sensation grossly intact b/l with 10 gram monofilament. Vibratory sensation intact b/l.   Dermatological Examination: Pedal skin warm and supple b/l. Toenails 1-5 b/l elongated, discolored, dystrophic, thickened, crumbly with subungual debris and tenderness to dorsal palpation.   Incurvated nailplate lateral border left hallux.  Nail border hypertrophy absent. There is tenderness to palpation. Sign(s) of infection: no clinical signs of infection noted on examination today.  Musculoskeletal Examination: Muscle strength 5/5 to b/l LE. HAV with bunion bilaterally and hammertoes 2-5 b/l.  Radiographs:  None  Assessment/Plan: 1. Pain due to onychomycosis of toenails of both feet   2. PVD (peripheral vascular disease) ([place])     No orders of the defined types were placed in this encounter.   -Consent given for treatment as described below: -Examined patient. -Mycotic toenails 1-5 bilaterally were debrided in length and girth with sterile nail nippers and dremel without incident. -Offending nail border debrided and curretaged L hallux utilizing sterile nail nipper and currette. Border(s) cleansed with alcohol and TAO applied. Patient/POA/Caregiver/Facility instructed to apply Neosporin Cream  to L hallux once daily for 7 days. Call office if there are any concerns. -Patient/POA to call should there be question/concern in the interim.   Return in about 3 months (around 10/10/2022).  Marzetta Board, DPM

## 2022-07-18 DIAGNOSIS — I129 Hypertensive chronic kidney disease with stage 1 through stage 4 chronic kidney disease, or unspecified chronic kidney disease: Secondary | ICD-10-CM | POA: Diagnosis not present

## 2022-07-18 DIAGNOSIS — N1832 Chronic kidney disease, stage 3b: Secondary | ICD-10-CM | POA: Diagnosis not present

## 2022-07-18 DIAGNOSIS — R051 Acute cough: Secondary | ICD-10-CM | POA: Diagnosis not present

## 2022-07-18 DIAGNOSIS — J069 Acute upper respiratory infection, unspecified: Secondary | ICD-10-CM | POA: Diagnosis not present

## 2022-07-18 DIAGNOSIS — J45909 Unspecified asthma, uncomplicated: Secondary | ICD-10-CM | POA: Diagnosis not present

## 2022-07-24 ENCOUNTER — Other Ambulatory Visit: Payer: Self-pay

## 2022-07-24 ENCOUNTER — Inpatient Hospital Stay (HOSPITAL_BASED_OUTPATIENT_CLINIC_OR_DEPARTMENT_OTHER): Payer: Medicare Other | Admitting: Hematology & Oncology

## 2022-07-24 ENCOUNTER — Encounter: Payer: Self-pay | Admitting: Hematology & Oncology

## 2022-07-24 ENCOUNTER — Inpatient Hospital Stay: Payer: Medicare Other | Attending: Hematology & Oncology

## 2022-07-24 VITALS — BP 152/68 | HR 80 | Temp 97.7°F | Resp 18 | Ht 71.0 in | Wt 211.0 lb

## 2022-07-24 DIAGNOSIS — D510 Vitamin B12 deficiency anemia due to intrinsic factor deficiency: Secondary | ICD-10-CM

## 2022-07-24 DIAGNOSIS — D751 Secondary polycythemia: Secondary | ICD-10-CM | POA: Insufficient documentation

## 2022-07-24 DIAGNOSIS — D500 Iron deficiency anemia secondary to blood loss (chronic): Secondary | ICD-10-CM | POA: Diagnosis not present

## 2022-07-24 DIAGNOSIS — R799 Abnormal finding of blood chemistry, unspecified: Secondary | ICD-10-CM | POA: Diagnosis not present

## 2022-07-24 LAB — CBC WITH DIFFERENTIAL (CANCER CENTER ONLY)
Abs Immature Granulocytes: 0.1 10*3/uL — ABNORMAL HIGH (ref 0.00–0.07)
Basophils Absolute: 0.1 10*3/uL (ref 0.0–0.1)
Basophils Relative: 1 %
Eosinophils Absolute: 0.1 10*3/uL (ref 0.0–0.5)
Eosinophils Relative: 1 %
HCT: 49.4 % (ref 39.0–52.0)
Hemoglobin: 15.1 g/dL (ref 13.0–17.0)
Immature Granulocytes: 1 %
Lymphocytes Relative: 24 %
Lymphs Abs: 1.9 10*3/uL (ref 0.7–4.0)
MCH: 25.3 pg — ABNORMAL LOW (ref 26.0–34.0)
MCHC: 30.6 g/dL (ref 30.0–36.0)
MCV: 82.6 fL (ref 80.0–100.0)
Monocytes Absolute: 0.9 10*3/uL (ref 0.1–1.0)
Monocytes Relative: 11 %
Neutro Abs: 4.9 10*3/uL (ref 1.7–7.7)
Neutrophils Relative %: 62 %
Platelet Count: 226 10*3/uL (ref 150–400)
RBC: 5.98 MIL/uL — ABNORMAL HIGH (ref 4.22–5.81)
RDW: 17.9 % — ABNORMAL HIGH (ref 11.5–15.5)
WBC Count: 7.8 10*3/uL (ref 4.0–10.5)
nRBC: 0 % (ref 0.0–0.2)

## 2022-07-24 LAB — CMP (CANCER CENTER ONLY)
ALT: 11 U/L (ref 0–44)
AST: 15 U/L (ref 15–41)
Albumin: 4.1 g/dL (ref 3.5–5.0)
Alkaline Phosphatase: 58 U/L (ref 38–126)
Anion gap: 7 (ref 5–15)
BUN: 29 mg/dL — ABNORMAL HIGH (ref 8–23)
CO2: 31 mmol/L (ref 22–32)
Calcium: 9.8 mg/dL (ref 8.9–10.3)
Chloride: 100 mmol/L (ref 98–111)
Creatinine: 1.9 mg/dL — ABNORMAL HIGH (ref 0.61–1.24)
GFR, Estimated: 36 mL/min — ABNORMAL LOW (ref >60)
Glucose, Bld: 95 mg/dL (ref 70–99)
Potassium: 4.4 mmol/L (ref 3.5–5.1)
Sodium: 138 mmol/L (ref 135–145)
Total Bilirubin: 0.7 mg/dL (ref 0.3–1.2)
Total Protein: 6.5 g/dL (ref 6.5–8.1)

## 2022-07-24 LAB — RETICULOCYTES
Immature Retic Fract: 15.4 % (ref 2.3–15.9)
RBC.: 5.97 MIL/uL — ABNORMAL HIGH (ref 4.22–5.81)
Retic Count, Absolute: 67.5 10*3/uL (ref 19.0–186.0)
Retic Ct Pct: 1.1 % (ref 0.4–3.1)

## 2022-07-24 LAB — FERRITIN: Ferritin: 5 ng/mL — ABNORMAL LOW (ref 24–336)

## 2022-07-24 NOTE — Progress Notes (Signed)
Hematology and Oncology Follow Up Visit  [name] [id number] November 11, 1945 76 y.o. 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 IV iron-Monoferric given on 03/31/2022     Interim History:  [name] is back for follow-up.  His color looks a lot better.  I suspect that his iron is probably back up now.  He has not donated blood for quite a while.  He was donating blood every 4 months.  However, back in June, his hemoglobin was quite low.  His iron saturation was quite low.  We she had to give him a dose of iron.  He feels okay.  He has had no headache.  He has had no tingling in the hands or feet.  He has had no problems with bowels or bladder.  There is a little bit of a cough.  He says he has "congestion."  And that he is on some antibiotics for this.  He has had no rashes.  There is been no leg swelling.  I think he is still doing some testosterone injections.  He says that this helps his arthritis.  Overall, I would say that his performance status is probably ECOG 1.    Medications:  Current Outpatient Medications:    albuterol (VENTOLIN HFA) 108 (90 Base) MCG/ACT inhaler, INHALE 2 PUFFS BY MOUTH EVERY 4 TO 6 HOURS AS NEEDED FOR COUGH AND FOR WHEEZING, Disp: 8 each, Rfl: 1   allopurinol (ZYLOPRIM) 100 MG tablet, 2 tablet Orally Once a day, Disp: , Rfl:    amLODipine (NORVASC) 2.5 MG tablet, Take 1 tablet by mouth 2 (two) times daily., Disp: , Rfl:    azelastine (ASTELIN) 0.1 % nasal spray, Use two sprays in each nostril twice daily as directed for nasal drainage., Disp: 30 mL, Rfl: 5   Besifloxacin HCl (BESIVANCE) 0.6 % SUSP, , Disp: , Rfl:    Cholecalciferol (VITAMIN D3 PO), Take by mouth daily., Disp: , Rfl:    dicyclomine (BENTYL) 10 MG capsule, take 1 capsule by mouth every 6 hours if needed for BLOATING, Disp: ,  Rfl:    doxazosin (CARDURA) 2 MG tablet, 1 tablet, Disp: , Rfl:    fluticasone (FLONASE) 50 MCG/ACT nasal spray, Place 2 sprays into both nostrils daily as needed (use for 1-2 weeks at a time.)., Disp: 16 g, Rfl: 5   Fluticasone Propionate (XHANCE) 93 MCG/ACT EXHU, Place into the nose., Disp: , Rfl:    furosemide (LASIX) 40 MG tablet, Take 40 mg by mouth daily., Disp: , Rfl: 3   gentamicin (GARAMYCIN) 0.3 % ophthalmic solution, Use 1 drop 2x a day to affected eye (Patient not taking: Reported on 03/26/2022), Disp: , Rfl:    HYDROcodone-acetaminophen (NORCO) 10-325 MG tablet, Take by mouth., Disp: , Rfl:    ketorolac (ACULAR) 0.5 % ophthalmic solution, Place into the left eye. (Patient not taking: Reported on 03/26/2022), Disp: , Rfl:    levocetirizine (XYZAL) 5 MG tablet, 1 tablet in the evening, Disp: , Rfl:    losartan (COZAAR) 25 MG tablet, Take 25 mg by mouth at bedtime., Disp: , Rfl:    methocarbamol (ROBAXIN) 500 MG tablet, Take 500 mg by mouth 3 (three) times daily as needed., Disp: , Rfl:    montelukast (SINGULAIR) 10 MG tablet, , Disp: , Rfl:    ofloxacin (OCUFLOX) 0.3 % ophthalmic solution, 2 drops. Uses for  3 days after injection. (Patient not taking: Reported on 03/26/2022), Disp: , Rfl:    omeprazole (PRILOSEC) 20 MG capsule, 1 capsule, Disp: , Rfl:    oxybutynin (DITROPAN) 5 MG tablet, Take by mouth., Disp: , Rfl:    oxyCODONE-acetaminophen (PERCOCET) 10-325 MG tablet, Take 1 tablet by mouth 3 (three) times daily as needed., Disp: , Rfl:    pneumococcal 13-valent conjugate vaccine (PREVNAR 13) SUSP injection, inject 0.5 milliliter intramuscularly, Disp: , Rfl:    potassium chloride (KLOR-CON M10) 10 MEQ tablet, 1 tablet with food Orally Twice a day, Disp: , Rfl:    prednisoLONE acetate (PRED FORTE) 1 % ophthalmic suspension, , Disp: , Rfl:    SYMBICORT 80-4.5 MCG/ACT inhaler, SMARTSIG:2 Puff(s) By Mouth Twice Daily, Disp: , Rfl:    tadalafil (CIALIS) 20 MG tablet, 1 tablet, Disp: ,  Rfl:    testosterone cypionate (DEPOTESTOTERONE CYPIONATE) 100 MG/ML injection, 0.33m, Disp: , Rfl:    triamcinolone cream (KENALOG) 0.5 %, , Disp: , Rfl:    trimethoprim-polymyxin b (POLYTRIM) ophthalmic solution, INSTILL 1 DROP INTO LEFT EYE 4 TIMES DAILY FOR 2 DAYS AFTER MONTHLY EYE INJECTION, Disp: , Rfl:   Allergies:  Allergies  Allergen Reactions   Caffeine Other (See Comments)    Urinary retention and jittery   Codeine Other (See Comments)    Jittery   Iodinated Contrast Media Other (See Comments)    Hard on kidneys. The blue dye that went through my IV.    Pseudoephedrine Other (See Comments)    Urinary retention   Simvastatin Other (See Comments)    Hurt my bones   Penicillins Itching   Chlorpheniramine-Pseudoeph    Infliximab     Other reaction(s): ear infections    Past Medical History, Surgical history, Social history, and Family History were reviewed and updated.  Review of Systems: Review of Systems  Constitutional: Negative.   HENT:  Negative.    Eyes: Negative.   Respiratory: Negative.    Cardiovascular: Negative.   Gastrointestinal: Negative.   Endocrine: Negative.   Genitourinary: Negative.    Musculoskeletal: Negative.   Skin: Negative.   Neurological: Negative.   Hematological: Negative.   Psychiatric/Behavioral: Negative.      Physical Exam:  height is '5\' 11"'$  (1.803 m) and weight is 211 lb (95.7 kg). His oral temperature is 97.7 F (36.5 C). His blood pressure is 152/68 (abnormal) and his pulse is 80. His respiration is 18 and oxygen saturation is 96%.   Wt Readings from Last 3 Encounters:  07/24/22 211 lb (95.7 kg)  03/26/22 208 lb (94.3 kg)  09/25/21 219 lb 1.9 oz (99.4 kg)    Physical Exam Vitals reviewed.  HENT:     Head: Normocephalic and atraumatic.  Eyes:     Pupils: Pupils are equal, round, and reactive to light.  Cardiovascular:     Rate and Rhythm: Normal rate and regular rhythm.     Heart sounds: Normal heart sounds.   Pulmonary:     Effort: Pulmonary effort is normal.     Breath sounds: Normal breath sounds.  Abdominal:     General: Bowel sounds are normal.     Palpations: Abdomen is soft.  Musculoskeletal:        General: No tenderness or deformity. Normal range of motion.     Cervical back: Normal range of motion.  Lymphadenopathy:     Cervical: No cervical adenopathy.  Skin:    General: Skin is warm and dry.     Findings:  No erythema or rash.  Neurological:     Mental Status: He is alert and oriented to person, place, and time.  Psychiatric:        Behavior: Behavior normal.        Thought Content: Thought content normal.        Judgment: Judgment normal.      Lab Results  Component Value Date   WBC 7.8 07/24/2022   HGB 15.1 07/24/2022   HCT 49.4 07/24/2022   MCV 82.6 07/24/2022   PLT 226 07/24/2022     Chemistry      Component Value Date/Time   NA 140 05/08/2022 1307   K 4.4 05/08/2022 1307   CL 101 05/08/2022 1307   CO2 32 05/08/2022 1307   BUN 30 (H) 05/08/2022 1307   CREATININE 1.94 (H) 05/08/2022 1307      Component Value Date/Time   CALCIUM 10.4 (H) 05/08/2022 1307   ALKPHOS 58 05/08/2022 1307   AST 16 05/08/2022 1307   ALT 11 05/08/2022 1307   BILITOT 1.1 05/08/2022 1307      Impression and Plan: [name] is a very nice 76 year old white male.  He has secondary polycythemia.  He is on testosterone injections which he needs.  I told him that he go ahead and donate blood.  We will make sure that he donates in a week or so.  Still think he can probably get back to donating blood every 3-4 months.  I would like to see him back in 3 months.   Volanda Napoleon, MD 10/19/20231:38 PM

## 2022-07-25 LAB — IRON AND IRON BINDING CAPACITY (CC-WL,HP ONLY)
Iron: 27 ug/dL — ABNORMAL LOW (ref 45–182)
Saturation Ratios: 6 % — ABNORMAL LOW (ref 17.9–39.5)
TIBC: 451 ug/dL — ABNORMAL HIGH (ref 250–450)
UIBC: 424 ug/dL — ABNORMAL HIGH (ref 117–376)

## 2022-08-01 ENCOUNTER — Encounter (INDEPENDENT_AMBULATORY_CARE_PROVIDER_SITE_OTHER): Payer: Medicare Other | Admitting: Ophthalmology

## 2022-08-01 DIAGNOSIS — I10 Essential (primary) hypertension: Secondary | ICD-10-CM | POA: Diagnosis not present

## 2022-08-01 DIAGNOSIS — H353221 Exudative age-related macular degeneration, left eye, with active choroidal neovascularization: Secondary | ICD-10-CM | POA: Diagnosis not present

## 2022-08-01 DIAGNOSIS — H43813 Vitreous degeneration, bilateral: Secondary | ICD-10-CM

## 2022-08-01 DIAGNOSIS — D3132 Benign neoplasm of left choroid: Secondary | ICD-10-CM | POA: Diagnosis not present

## 2022-08-01 DIAGNOSIS — H353111 Nonexudative age-related macular degeneration, right eye, early dry stage: Secondary | ICD-10-CM | POA: Diagnosis not present

## 2022-08-01 DIAGNOSIS — H35033 Hypertensive retinopathy, bilateral: Secondary | ICD-10-CM | POA: Diagnosis not present

## 2022-08-07 DIAGNOSIS — Z5181 Encounter for therapeutic drug level monitoring: Secondary | ICD-10-CM | POA: Diagnosis not present

## 2022-08-07 DIAGNOSIS — M5416 Radiculopathy, lumbar region: Secondary | ICD-10-CM | POA: Diagnosis not present

## 2022-08-07 DIAGNOSIS — M5030 Other cervical disc degeneration, unspecified cervical region: Secondary | ICD-10-CM | POA: Diagnosis not present

## 2022-08-07 DIAGNOSIS — M5459 Other low back pain: Secondary | ICD-10-CM | POA: Diagnosis not present

## 2022-08-07 DIAGNOSIS — M5136 Other intervertebral disc degeneration, lumbar region: Secondary | ICD-10-CM | POA: Diagnosis not present

## 2022-08-07 DIAGNOSIS — M47812 Spondylosis without myelopathy or radiculopathy, cervical region: Secondary | ICD-10-CM | POA: Diagnosis not present

## 2022-08-07 DIAGNOSIS — Z79899 Other long term (current) drug therapy: Secondary | ICD-10-CM | POA: Diagnosis not present

## 2022-08-07 DIAGNOSIS — Z79891 Long term (current) use of opiate analgesic: Secondary | ICD-10-CM | POA: Diagnosis not present

## 2022-09-01 DIAGNOSIS — M5416 Radiculopathy, lumbar region: Secondary | ICD-10-CM | POA: Diagnosis not present

## 2022-09-02 ENCOUNTER — Telehealth: Payer: Self-pay

## 2022-09-02 NOTE — Telephone Encounter (Signed)
Pt called inquiring if we could fax over lab results from 10/19 to [facility].   Routed via epic.

## 2022-09-05 ENCOUNTER — Encounter (INDEPENDENT_AMBULATORY_CARE_PROVIDER_SITE_OTHER): Payer: Medicare Other | Admitting: Ophthalmology

## 2022-09-05 DIAGNOSIS — H353111 Nonexudative age-related macular degeneration, right eye, early dry stage: Secondary | ICD-10-CM

## 2022-09-05 DIAGNOSIS — I10 Essential (primary) hypertension: Secondary | ICD-10-CM | POA: Diagnosis not present

## 2022-09-05 DIAGNOSIS — D3132 Benign neoplasm of left choroid: Secondary | ICD-10-CM

## 2022-09-05 DIAGNOSIS — H43813 Vitreous degeneration, bilateral: Secondary | ICD-10-CM | POA: Diagnosis not present

## 2022-09-05 DIAGNOSIS — H353221 Exudative age-related macular degeneration, left eye, with active choroidal neovascularization: Secondary | ICD-10-CM | POA: Diagnosis not present

## 2022-09-05 DIAGNOSIS — H35033 Hypertensive retinopathy, bilateral: Secondary | ICD-10-CM

## 2022-09-09 DIAGNOSIS — M109 Gout, unspecified: Secondary | ICD-10-CM | POA: Diagnosis not present

## 2022-09-09 DIAGNOSIS — N2581 Secondary hyperparathyroidism of renal origin: Secondary | ICD-10-CM | POA: Diagnosis not present

## 2022-09-09 DIAGNOSIS — D751 Secondary polycythemia: Secondary | ICD-10-CM | POA: Diagnosis not present

## 2022-09-09 DIAGNOSIS — N209 Urinary calculus, unspecified: Secondary | ICD-10-CM | POA: Diagnosis not present

## 2022-09-09 DIAGNOSIS — I129 Hypertensive chronic kidney disease with stage 1 through stage 4 chronic kidney disease, or unspecified chronic kidney disease: Secondary | ICD-10-CM | POA: Diagnosis not present

## 2022-09-09 DIAGNOSIS — N1832 Chronic kidney disease, stage 3b: Secondary | ICD-10-CM | POA: Diagnosis not present

## 2022-09-11 DIAGNOSIS — K219 Gastro-esophageal reflux disease without esophagitis: Secondary | ICD-10-CM | POA: Diagnosis not present

## 2022-09-11 DIAGNOSIS — M069 Rheumatoid arthritis, unspecified: Secondary | ICD-10-CM | POA: Diagnosis not present

## 2022-09-11 DIAGNOSIS — R051 Acute cough: Secondary | ICD-10-CM | POA: Diagnosis not present

## 2022-09-11 DIAGNOSIS — J309 Allergic rhinitis, unspecified: Secondary | ICD-10-CM | POA: Diagnosis not present

## 2022-09-11 DIAGNOSIS — E291 Testicular hypofunction: Secondary | ICD-10-CM | POA: Diagnosis not present

## 2022-09-11 DIAGNOSIS — N200 Calculus of kidney: Secondary | ICD-10-CM | POA: Diagnosis not present

## 2022-09-11 DIAGNOSIS — E559 Vitamin D deficiency, unspecified: Secondary | ICD-10-CM | POA: Diagnosis not present

## 2022-09-11 DIAGNOSIS — E785 Hyperlipidemia, unspecified: Secondary | ICD-10-CM | POA: Diagnosis not present

## 2022-09-11 DIAGNOSIS — N1832 Chronic kidney disease, stage 3b: Secondary | ICD-10-CM | POA: Diagnosis not present

## 2022-09-11 DIAGNOSIS — I129 Hypertensive chronic kidney disease with stage 1 through stage 4 chronic kidney disease, or unspecified chronic kidney disease: Secondary | ICD-10-CM | POA: Diagnosis not present

## 2022-09-11 DIAGNOSIS — J329 Chronic sinusitis, unspecified: Secondary | ICD-10-CM | POA: Diagnosis not present

## 2022-09-11 DIAGNOSIS — E669 Obesity, unspecified: Secondary | ICD-10-CM | POA: Diagnosis not present

## 2022-10-16 ENCOUNTER — Ambulatory Visit (INDEPENDENT_AMBULATORY_CARE_PROVIDER_SITE_OTHER): Payer: Medicare Other | Admitting: Podiatry

## 2022-10-16 ENCOUNTER — Encounter: Payer: Self-pay | Admitting: Podiatry

## 2022-10-16 VITALS — BP 138/68

## 2022-10-16 DIAGNOSIS — M79675 Pain in left toe(s): Secondary | ICD-10-CM

## 2022-10-16 DIAGNOSIS — I739 Peripheral vascular disease, unspecified: Secondary | ICD-10-CM | POA: Diagnosis not present

## 2022-10-16 DIAGNOSIS — B351 Tinea unguium: Secondary | ICD-10-CM

## 2022-10-16 DIAGNOSIS — M79674 Pain in right toe(s): Secondary | ICD-10-CM

## 2022-10-16 NOTE — Progress Notes (Signed)
  Subjective:  Patient ID: [name], male    DOB: [birth year],  MRN: [id number]  [name] presents to clinic today for at risk foot care. Patient has h/o PAD and painful elongated mycotic toenails 1-5 bilaterally which are tender when wearing enclosed shoe gear. Pain is relieved with periodic professional debridement.  Chief Complaint  Patient presents with   Nail Problem    Nail Trim Not Diabetic PCP - Dr Dagmar Hait , last OV 12/23   New problem(s): None.   PCP is Avva, Ravisankar, MD.  Allergies  Allergen Reactions   Caffeine Other (See Comments)    Urinary retention and jittery   Codeine Other (See Comments)    Jittery   Iodinated Contrast Media Other (See Comments)    Hard on kidneys. The blue dye that went through my IV.    Pseudoephedrine Other (See Comments)    Urinary retention   Simvastatin Other (See Comments)    Hurt my bones   Penicillins Itching   Chlorpheniramine-Pseudoeph    Infliximab     Other reaction(s): ear infections    Review of Systems: Negative except as noted in the HPI.  Objective: No changes noted in today's physical examination. Vitals:   10/16/22 1324  BP: 138/68   [name] is a pleasant 77 y.o. male in NAD. AAO x 3.  Vascular Examination: CFT <4 seconds b/l. DP pulses faintly palpable b/l. PT pulses diminished b/l.  Skin temperature gradient warm to warm b/l. No ischemia or gangrene. No cyanosis or clubbing noted b/l. Pedal hair sparse. Varicosities present b/l.   Neurological Examination: Sensation grossly intact b/l with 10 gram monofilament. Vibratory sensation intact b/l.   Dermatological Examination: Pedal skin warm and supple b/l. Toenails 1-5 b/l elongated, discolored, dystrophic, thickened, crumbly with subungual debris and tenderness to dorsal palpation.   Incurvated nailplate lateral border left hallux.  Nail border hypertrophy absent. There is tenderness to palpation. Sign(s) of infection: no clinical signs of  infection noted on examination today.  Musculoskeletal Examination: Muscle strength 5/5 to b/l LE. HAV with bunion bilaterally and hammertoes 2-5 b/l.  Radiographs: None  Assessment/Plan: 1. Pain due to onychomycosis of toenails of both feet   2. PVD (peripheral vascular disease) ([place])     No orders of the defined types were placed in this encounter.   -Patient was evaluated and treated. All patient's and/or POA's questions/concerns answered on today's visit. -Patient to continue soft, supportive shoe gear daily. -Toenails 1-5 b/l were debrided in length and girth with sterile nail nippers and dremel without iatrogenic bleeding.  -Patient/POA to call should there be question/concern in the interim.   Return in about 3 months (around 01/15/2023).  Marzetta Board, DPM

## 2022-10-17 ENCOUNTER — Encounter (INDEPENDENT_AMBULATORY_CARE_PROVIDER_SITE_OTHER): Payer: Medicare Other | Admitting: Ophthalmology

## 2022-10-17 DIAGNOSIS — H35033 Hypertensive retinopathy, bilateral: Secondary | ICD-10-CM | POA: Diagnosis not present

## 2022-10-17 DIAGNOSIS — H353111 Nonexudative age-related macular degeneration, right eye, early dry stage: Secondary | ICD-10-CM | POA: Diagnosis not present

## 2022-10-17 DIAGNOSIS — H43813 Vitreous degeneration, bilateral: Secondary | ICD-10-CM | POA: Diagnosis not present

## 2022-10-17 DIAGNOSIS — H353221 Exudative age-related macular degeneration, left eye, with active choroidal neovascularization: Secondary | ICD-10-CM | POA: Diagnosis not present

## 2022-10-17 DIAGNOSIS — I10 Essential (primary) hypertension: Secondary | ICD-10-CM

## 2022-10-17 DIAGNOSIS — D3132 Benign neoplasm of left choroid: Secondary | ICD-10-CM | POA: Diagnosis not present

## 2022-10-27 ENCOUNTER — Inpatient Hospital Stay (HOSPITAL_BASED_OUTPATIENT_CLINIC_OR_DEPARTMENT_OTHER): Payer: Medicare Other | Admitting: Hematology & Oncology

## 2022-10-27 ENCOUNTER — Inpatient Hospital Stay: Payer: Medicare Other | Attending: Hematology & Oncology

## 2022-10-27 ENCOUNTER — Other Ambulatory Visit: Payer: Self-pay

## 2022-10-27 ENCOUNTER — Encounter: Payer: Self-pay | Admitting: Hematology & Oncology

## 2022-10-27 VITALS — BP 137/62 | HR 83 | Temp 98.5°F | Resp 18 | Ht 71.5 in | Wt 207.0 lb

## 2022-10-27 DIAGNOSIS — K5900 Constipation, unspecified: Secondary | ICD-10-CM | POA: Diagnosis not present

## 2022-10-27 DIAGNOSIS — I129 Hypertensive chronic kidney disease with stage 1 through stage 4 chronic kidney disease, or unspecified chronic kidney disease: Secondary | ICD-10-CM

## 2022-10-27 DIAGNOSIS — D500 Iron deficiency anemia secondary to blood loss (chronic): Secondary | ICD-10-CM | POA: Diagnosis not present

## 2022-10-27 DIAGNOSIS — R799 Abnormal finding of blood chemistry, unspecified: Secondary | ICD-10-CM

## 2022-10-27 DIAGNOSIS — D751 Secondary polycythemia: Secondary | ICD-10-CM | POA: Insufficient documentation

## 2022-10-27 LAB — CBC WITH DIFFERENTIAL (CANCER CENTER ONLY)
Abs Immature Granulocytes: 0.07 10*3/uL (ref 0.00–0.07)
Basophils Absolute: 0.1 10*3/uL (ref 0.0–0.1)
Basophils Relative: 1 %
Eosinophils Absolute: 0.1 10*3/uL (ref 0.0–0.5)
Eosinophils Relative: 1 %
HCT: 46.9 % (ref 39.0–52.0)
Hemoglobin: 13.7 g/dL (ref 13.0–17.0)
Immature Granulocytes: 1 %
Lymphocytes Relative: 28 %
Lymphs Abs: 1.9 10*3/uL (ref 0.7–4.0)
MCH: 24.1 pg — ABNORMAL LOW (ref 26.0–34.0)
MCHC: 29.2 g/dL — ABNORMAL LOW (ref 30.0–36.0)
MCV: 82.6 fL (ref 80.0–100.0)
Monocytes Absolute: 1.1 10*3/uL — ABNORMAL HIGH (ref 0.1–1.0)
Monocytes Relative: 16 %
Neutro Abs: 3.7 10*3/uL (ref 1.7–7.7)
Neutrophils Relative %: 53 %
Platelet Count: 256 10*3/uL (ref 150–400)
RBC: 5.68 MIL/uL (ref 4.22–5.81)
RDW: 16.1 % — ABNORMAL HIGH (ref 11.5–15.5)
WBC Count: 7 10*3/uL (ref 4.0–10.5)
nRBC: 0 % (ref 0.0–0.2)

## 2022-10-27 LAB — RETICULOCYTES
Immature Retic Fract: 14.3 % (ref 2.3–15.9)
RBC.: 5.7 MIL/uL (ref 4.22–5.81)
Retic Count, Absolute: 77.4 10*3/uL (ref 19.0–186.0)
Retic Ct Pct: 1.4 % (ref 0.4–3.1)

## 2022-10-27 LAB — CMP (CANCER CENTER ONLY)
ALT: 8 U/L (ref 0–44)
AST: 12 U/L — ABNORMAL LOW (ref 15–41)
Albumin: 4.5 g/dL (ref 3.5–5.0)
Alkaline Phosphatase: 62 U/L (ref 38–126)
Anion gap: 8 (ref 5–15)
BUN: 27 mg/dL — ABNORMAL HIGH (ref 8–23)
CO2: 33 mmol/L — ABNORMAL HIGH (ref 22–32)
Calcium: 10.1 mg/dL (ref 8.9–10.3)
Chloride: 102 mmol/L (ref 98–111)
Creatinine: 1.98 mg/dL — ABNORMAL HIGH (ref 0.61–1.24)
GFR, Estimated: 34 mL/min — ABNORMAL LOW (ref >60)
Glucose, Bld: 89 mg/dL (ref 70–99)
Potassium: 4.8 mmol/L (ref 3.5–5.1)
Sodium: 143 mmol/L (ref 135–145)
Total Bilirubin: 0.6 mg/dL (ref 0.3–1.2)
Total Protein: 6.8 g/dL (ref 6.5–8.1)

## 2022-10-27 LAB — FERRITIN: Ferritin: 4 ng/mL — ABNORMAL LOW (ref 24–336)

## 2022-10-27 LAB — LACTATE DEHYDROGENASE: LDH: 120 U/L (ref 98–192)

## 2022-10-27 NOTE — Progress Notes (Signed)
Hematology and Oncology Follow Up Visit  OATHER MUILENBURG [id number] 14-Dec-1945 77 y.o. 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 IV iron-Monoferric given on 03/31/2022     Interim History:  Mr. Kersh is back for follow-up.  We last saw him back in October.  He has been donating blood to the [employer].  He is doing a good job with this.  I think he donated a couple weeks ago.  He has had no problems since we last saw him.  He does see a Nephrologist.  He has had no fever.  He has had no problems with COVID or Influenza.  There is been no change in bowel or bladder habits.  He does have a little bit of constipation.  He says that his lungs are finally cleared up.  He said that it took 4 months to get his lungs cleared up.  I am happy for him.  His appetite is good.  There is no nausea or vomiting.  He has had no rashes.  There has been no headaches.  He is still doing his testosterone injections.  His last iron studies back in October showed a ferritin of 5 with an iron saturation of 6%.  Currently, I would say that his performance status is probably ECOG 1.  Medications:  Current Outpatient Medications:    albuterol (VENTOLIN HFA) 108 (90 Base) MCG/ACT inhaler, INHALE 2 PUFFS BY MOUTH EVERY 4 TO 6 HOURS AS NEEDED FOR COUGH AND FOR WHEEZING, Disp: 8 each, Rfl: 1   allopurinol (ZYLOPRIM) 100 MG tablet, 2 tablet Orally Once a day, Disp: , Rfl:    amLODipine (NORVASC) 2.5 MG tablet, Take 1 tablet by mouth 2 (two) times daily., Disp: , Rfl:    azelastine (ASTELIN) 0.1 % nasal spray, Use two sprays in each nostril twice daily as directed for nasal drainage., Disp: 30 mL, Rfl: 5   Besifloxacin HCl (BESIVANCE) 0.6 % SUSP, , Disp: , Rfl:    Cholecalciferol (VITAMIN D3 PO), Take by mouth daily., Disp: , Rfl:    dicyclomine  (BENTYL) 10 MG capsule, take 1 capsule by mouth every 6 hours if needed for BLOATING, Disp: , Rfl:    doxazosin (CARDURA) 2 MG tablet, 1 tablet, Disp: , Rfl:    fluticasone (FLONASE) 50 MCG/ACT nasal spray, Place 2 sprays into both nostrils daily as needed (use for 1-2 weeks at a time.)., Disp: 16 g, Rfl: 5   Fluticasone Propionate (XHANCE) 93 MCG/ACT EXHU, Place into the nose., Disp: , Rfl:    furosemide (LASIX) 40 MG tablet, Take 40 mg by mouth daily., Disp: , Rfl: 3   gentamicin (GARAMYCIN) 0.3 % ophthalmic solution, Use 1 drop 2x a day to affected eye (Patient not taking: Reported on 03/26/2022), Disp: , Rfl:    HYDROcodone-acetaminophen (NORCO) 10-325 MG tablet, Take by mouth., Disp: , Rfl:    ketorolac (ACULAR) 0.5 % ophthalmic solution, Place into the left eye. (Patient not taking: Reported on 03/26/2022), Disp: , Rfl:    levocetirizine (XYZAL) 5 MG tablet, 1 tablet in the evening, Disp: , Rfl:    losartan (COZAAR) 25 MG tablet, Take 25 mg by mouth at bedtime., Disp: , Rfl:    methocarbamol (ROBAXIN) 500 MG tablet, Take 500 mg by mouth 3 (three) times daily as needed., Disp: , Rfl:    montelukast (SINGULAIR) 10 MG tablet, ,  Disp: , Rfl:    ofloxacin (OCUFLOX) 0.3 % ophthalmic solution, 2 drops. Uses for 3 days after injection. (Patient not taking: Reported on 03/26/2022), Disp: , Rfl:    omeprazole (PRILOSEC) 20 MG capsule, 1 capsule, Disp: , Rfl:    oxybutynin (DITROPAN) 5 MG tablet, Take by mouth., Disp: , Rfl:    oxyCODONE-acetaminophen (PERCOCET) 10-325 MG tablet, Take 1 tablet by mouth 3 (three) times daily as needed., Disp: , Rfl:    pneumococcal 13-valent conjugate vaccine (PREVNAR 13) SUSP injection, inject 0.5 milliliter intramuscularly, Disp: , Rfl:    potassium chloride (KLOR-CON M10) 10 MEQ tablet, 1 tablet with food Orally Twice a day, Disp: , Rfl:    prednisoLONE acetate (PRED FORTE) 1 % ophthalmic suspension, , Disp: , Rfl:    SYMBICORT 80-4.5 MCG/ACT inhaler, SMARTSIG:2 Puff(s)  By Mouth Twice Daily, Disp: , Rfl:    tadalafil (CIALIS) 20 MG tablet, 1 tablet, Disp: , Rfl:    testosterone cypionate (DEPOTESTOTERONE CYPIONATE) 100 MG/ML injection, 0.77m, Disp: , Rfl:    triamcinolone cream (KENALOG) 0.5 %, , Disp: , Rfl:    trimethoprim-polymyxin b (POLYTRIM) ophthalmic solution, INSTILL 1 DROP INTO LEFT EYE 4 TIMES DAILY FOR 2 DAYS AFTER MONTHLY EYE INJECTION, Disp: , Rfl:   Allergies:  Allergies  Allergen Reactions   Caffeine Other (See Comments)    Urinary retention and jittery   Codeine Other (See Comments)    Jittery   Iodinated Contrast Media Other (See Comments)    Hard on kidneys. The blue dye that went through my IV.    Pseudoephedrine Other (See Comments)    Urinary retention   Simvastatin Other (See Comments)    Hurt my bones   Penicillins Itching   Chlorpheniramine-Pseudoeph    Infliximab     Other reaction(s): ear infections    Past Medical History, Surgical history, Social history, and Family History were reviewed and updated.  Review of Systems: Review of Systems  Constitutional: Negative.   HENT:  Negative.    Eyes: Negative.   Respiratory: Negative.    Cardiovascular: Negative.   Gastrointestinal: Negative.   Endocrine: Negative.   Genitourinary: Negative.    Musculoskeletal: Negative.   Skin: Negative.   Neurological: Negative.   Hematological: Negative.   Psychiatric/Behavioral: Negative.      Physical Exam:  height is 5' 11.5" (1.816 m) and weight is 207 lb (93.9 kg). His oral temperature is 98.5 F (36.9 C). His blood pressure is 137/62 and his pulse is 83. His respiration is 18 and oxygen saturation is 99%.   Wt Readings from Last 3 Encounters:  10/27/22 207 lb (93.9 kg)  07/24/22 211 lb (95.7 kg)  03/26/22 208 lb (94.3 kg)    Physical Exam Vitals reviewed.  HENT:     Head: Normocephalic and atraumatic.  Eyes:     Pupils: Pupils are equal, round, and reactive to light.  Cardiovascular:     Rate and Rhythm:  Normal rate and regular rhythm.     Heart sounds: Normal heart sounds.  Pulmonary:     Effort: Pulmonary effort is normal.     Breath sounds: Normal breath sounds.  Abdominal:     General: Bowel sounds are normal.     Palpations: Abdomen is soft.  Musculoskeletal:        General: No tenderness or deformity. Normal range of motion.     Cervical back: Normal range of motion.  Lymphadenopathy:     Cervical: No cervical adenopathy.  Skin:  General: Skin is warm and dry.     Findings: No erythema or rash.  Neurological:     Mental Status: He is alert and oriented to person, place, and time.  Psychiatric:        Behavior: Behavior normal.        Thought Content: Thought content normal.        Judgment: Judgment normal.     Lab Results  Component Value Date   WBC 7.0 10/27/2022   HGB 13.7 10/27/2022   HCT 46.9 10/27/2022   MCV 82.6 10/27/2022   PLT 256 10/27/2022     Chemistry      Component Value Date/Time   NA 143 10/27/2022 1318   K 4.8 10/27/2022 1318   CL 102 10/27/2022 1318   CO2 33 (H) 10/27/2022 1318   BUN 27 (H) 10/27/2022 1318   CREATININE 1.98 (H) 10/27/2022 1318      Component Value Date/Time   CALCIUM 10.1 10/27/2022 1318   ALKPHOS 62 10/27/2022 1318   AST 12 (L) 10/27/2022 1318   ALT 8 10/27/2022 1318   BILITOT 0.6 10/27/2022 1318      Impression and Plan: Mr. Basey is a very nice 77 year old white male.  He has secondary polycythemia.  He is on testosterone injections which he needs.  Again, I think is doing a great job with respect to his phlebotomies.  His hemoglobin is holding pretty steady which is nice to see.  His quality of life is also doing quite well right now.  I will plan to get him back in another 4 months or so.  I told him that he go ahead and donate blood.  We will make sure that he donates in a week or so.  Still think he can probably get back to donating blood every 3-4 months.  I would like to see him back in 3  months.   Volanda Napoleon, MD 1/22/20241:55 PM

## 2022-10-28 LAB — IRON AND IRON BINDING CAPACITY (CC-WL,HP ONLY)
Iron: 20 ug/dL — ABNORMAL LOW (ref 45–182)
Saturation Ratios: 4 % — ABNORMAL LOW (ref 17.9–39.5)
TIBC: 476 ug/dL — ABNORMAL HIGH (ref 250–450)
UIBC: 456 ug/dL — ABNORMAL HIGH (ref 117–376)

## 2022-11-04 DIAGNOSIS — H10403 Unspecified chronic conjunctivitis, bilateral: Secondary | ICD-10-CM | POA: Diagnosis not present

## 2022-11-04 DIAGNOSIS — Z961 Presence of intraocular lens: Secondary | ICD-10-CM | POA: Diagnosis not present

## 2022-11-28 ENCOUNTER — Encounter (INDEPENDENT_AMBULATORY_CARE_PROVIDER_SITE_OTHER): Payer: Medicare Other | Admitting: Ophthalmology

## 2022-11-28 ENCOUNTER — Telehealth: Payer: Self-pay | Admitting: *Deleted

## 2022-11-28 DIAGNOSIS — D3132 Benign neoplasm of left choroid: Secondary | ICD-10-CM

## 2022-11-28 DIAGNOSIS — H353221 Exudative age-related macular degeneration, left eye, with active choroidal neovascularization: Secondary | ICD-10-CM

## 2022-11-28 DIAGNOSIS — H35033 Hypertensive retinopathy, bilateral: Secondary | ICD-10-CM

## 2022-11-28 DIAGNOSIS — H353112 Nonexudative age-related macular degeneration, right eye, intermediate dry stage: Secondary | ICD-10-CM

## 2022-11-28 DIAGNOSIS — I10 Essential (primary) hypertension: Secondary | ICD-10-CM

## 2022-11-28 DIAGNOSIS — H43813 Vitreous degeneration, bilateral: Secondary | ICD-10-CM | POA: Diagnosis not present

## 2022-11-28 NOTE — Telephone Encounter (Signed)
Patient called inquiring about one blood orders.  One blood orders faxed to [facility] per patient request

## 2022-12-23 DIAGNOSIS — Z6829 Body mass index (BMI) 29.0-29.9, adult: Secondary | ICD-10-CM | POA: Diagnosis not present

## 2022-12-23 DIAGNOSIS — M5136 Other intervertebral disc degeneration, lumbar region: Secondary | ICD-10-CM | POA: Diagnosis not present

## 2022-12-23 DIAGNOSIS — M1991 Primary osteoarthritis, unspecified site: Secondary | ICD-10-CM | POA: Diagnosis not present

## 2022-12-23 DIAGNOSIS — N1832 Chronic kidney disease, stage 3b: Secondary | ICD-10-CM | POA: Diagnosis not present

## 2022-12-23 DIAGNOSIS — M1A09X1 Idiopathic chronic gout, multiple sites, with tophus (tophi): Secondary | ICD-10-CM | POA: Diagnosis not present

## 2022-12-23 DIAGNOSIS — E663 Overweight: Secondary | ICD-10-CM | POA: Diagnosis not present

## 2022-12-23 DIAGNOSIS — M0579 Rheumatoid arthritis with rheumatoid factor of multiple sites without organ or systems involvement: Secondary | ICD-10-CM | POA: Diagnosis not present

## 2022-12-25 ENCOUNTER — Other Ambulatory Visit: Payer: Self-pay | Admitting: Allergy

## 2022-12-25 ENCOUNTER — Other Ambulatory Visit: Payer: Self-pay | Admitting: *Deleted

## 2022-12-25 MED ORDER — FLUTICASONE PROPIONATE 50 MCG/ACT NA SUSP: 2.0000 | Freq: Every day | NASAL | 0 refills | Status: DC | PRN

## 2022-12-29 ENCOUNTER — Telehealth: Payer: Self-pay | Admitting: Allergy

## 2022-12-29 ENCOUNTER — Other Ambulatory Visit: Payer: Self-pay | Admitting: *Deleted

## 2022-12-29 MED ORDER — ALBUTEROL SULFATE HFA 108 (90 BASE) MCG/ACT IN AERS: INHALATION_SPRAY | RESPIRATORY_TRACT | 0 refills | Status: DC

## 2022-12-29 NOTE — Telephone Encounter (Signed)
One refill sent to hold over until appt.

## 2022-12-29 NOTE — Telephone Encounter (Signed)
Patients pharmacy mentioned he is not authorized for his inhaler prescription. Patient states his INS covers his inhaler so he's not sure what's going on. I did inform he is due for an OV and he scheduled for 4/2 with Dr. Nelva Bush. He is wondering if we are able to call the pharmacy and see what is going on and if he can have a courtesy refill since he is completely out.

## 2023-01-05 DIAGNOSIS — M5136 Other intervertebral disc degeneration, lumbar region: Secondary | ICD-10-CM | POA: Diagnosis not present

## 2023-01-05 DIAGNOSIS — M5030 Other cervical disc degeneration, unspecified cervical region: Secondary | ICD-10-CM | POA: Diagnosis not present

## 2023-01-05 DIAGNOSIS — M7918 Myalgia, other site: Secondary | ICD-10-CM | POA: Diagnosis not present

## 2023-01-05 DIAGNOSIS — M47812 Spondylosis without myelopathy or radiculopathy, cervical region: Secondary | ICD-10-CM | POA: Diagnosis not present

## 2023-01-06 ENCOUNTER — Ambulatory Visit (INDEPENDENT_AMBULATORY_CARE_PROVIDER_SITE_OTHER): Payer: Medicare Other | Admitting: Allergy

## 2023-01-06 ENCOUNTER — Encounter: Payer: Self-pay | Admitting: Allergy

## 2023-01-06 VITALS — BP 126/82 | HR 67 | Resp 16 | Ht 70.0 in | Wt 212.8 lb

## 2023-01-06 DIAGNOSIS — J310 Chronic rhinitis: Secondary | ICD-10-CM

## 2023-01-06 DIAGNOSIS — J4520 Mild intermittent asthma, uncomplicated: Secondary | ICD-10-CM | POA: Diagnosis not present

## 2023-01-06 MED ORDER — FLUTICASONE PROPIONATE 50 MCG/ACT NA SUSP: 2.0000 | Freq: Every day | NASAL | 5 refills | Status: DC | PRN

## 2023-01-06 MED ORDER — ALBUTEROL SULFATE (2.5 MG/3ML) 0.083% IN NEBU: 2.5000 mg | INHALATION_SOLUTION | RESPIRATORY_TRACT | 1 refills | Status: DC | PRN

## 2023-01-06 MED ORDER — ALBUTEROL SULFATE HFA 108 (90 BASE) MCG/ACT IN AERS: INHALATION_SPRAY | RESPIRATORY_TRACT | 1 refills | Status: DC

## 2023-01-06 NOTE — Patient Instructions (Addendum)
Rhinitis, non-allergic    - use xyzal 5mg  daily as needed    - continue use of Xhance 2 sprays twice a day as needed for nasal congestion control.      - if Truett Perna is not available can use Flonase 2 sprays each nostril daily for 1-2 weeks at a time before stopping once nasal congestion improves for maximum benefit    - for nasal drainage/runny nose recommend use of nasal antihistamine, Astelin,2 2 sprays each nostril twice a day   Reactive airway   - have access to Ventolin (albuterol) inhaler 2 puffs or albuterol 1 vial via nebulizer every 4-6 hours as needed for cough/wheeze/shortness of breath/chest tightness.  May use 15-20 minutes prior to activity.   Monitor frequency of use.     - use inhaler with spacer chamber    - nebulizer provided   Asthma control goals:  Full participation in all desired activities (may need albuterol before activity) Albuterol use two time or less a week on average (not counting use with activity) Cough interfering with sleep two time or less a month Oral steroids no more than once a year No hospitalizations  Follow-up 6-12 months or sooner if needed

## 2023-01-06 NOTE — Progress Notes (Signed)
Follow-up Note  RE: [name] MRN: [id number] DOB: 12-Jun-1946 Date of Office Visit: 01/06/2023   History of present illness: [name] is a 77 y.o. male presenting today for follow-up of non-allergic rhinitis, reactive airway.  He was last seen in the office on 01/28/22 by myself.   He states over the winter months he had a long time of chest congestion and states it took a while to clear this congestion.    He states the right ear has been draining.  He states he has been using an eyedrop  that he was rescommended to use in the ear.  He states the drop has been helpful.  He states the ear was itching and draining.  He could see the drainage in th eear.   He states one of his albuterol inhalers stopped working about half way into the puffs.  He states over the winter he did have some episdoes of wheezing with the respirstory   He states he will use albuterol for wheeze multiple times a month.     He states he is uptodate with flu and RSV this season.   Review of systems: Review of Systems  Constitutional: Negative.   HENT: Negative.    Eyes: Negative.   Respiratory: Negative.    Cardiovascular: Negative.   Musculoskeletal: Negative.   Skin: Negative.   Allergic/Immunologic: Negative.   Neurological: Negative.      All other systems negative unless noted above in HPI  Past medical/social/surgical/family history have been reviewed and are unchanged unless specifically indicated below.  No changes  Medication List: Current Outpatient Medications  Medication Sig Dispense Refill   albuterol (VENTOLIN HFA) 108 (90 Base) MCG/ACT inhaler Inhale two puffs every 4-6 hours if needed for cough or wheeze. 1 each 0   allopurinol (ZYLOPRIM) 100 MG tablet 2 tablet Orally Once a day     amLODipine (NORVASC) 2.5 MG tablet Take 1 tablet by mouth 2 (two) times daily.     Besifloxacin HCl (BESIVANCE) 0.6 % SUSP      Cholecalciferol (VITAMIN D3 PO) Take by mouth daily.      cromolyn (OPTICROM) 4 % ophthalmic solution 1 drop 4 (four) times daily.     dicyclomine (BENTYL) 10 MG capsule take 1 capsule by mouth every 6 hours if needed for BLOATING     doxazosin (CARDURA) 2 MG tablet 1 tablet     fluticasone (FLONASE) 50 MCG/ACT nasal spray Place 2 sprays into both nostrils daily as needed (use for 1-2 weeks at a time.). 16 g 0   Fluticasone Propionate (XHANCE) 93 MCG/ACT EXHU Place into the nose.     furosemide (LASIX) 40 MG tablet Take 40 mg by mouth daily.  3   gentamicin (GARAMYCIN) 0.3 % ophthalmic solution      HYDROcodone-acetaminophen (NORCO) 10-325 MG tablet Take by mouth.     ketorolac (ACULAR) 0.5 % ophthalmic solution Place into the left eye.     levocetirizine (XYZAL) 5 MG tablet 1 tablet in the evening     losartan (COZAAR) 25 MG tablet Take 25 mg by mouth at bedtime.     methocarbamol (ROBAXIN) 500 MG tablet Take 500 mg by mouth 3 (three) times daily as needed.     ofloxacin (OCUFLOX) 0.3 % ophthalmic solution 2 drops. Uses for 3 days after injection.     omeprazole (PRILOSEC) 20 MG capsule 1 capsule     oxybutynin (DITROPAN) 5 MG tablet Take by mouth.  oxyCODONE-acetaminophen (PERCOCET) 10-325 MG tablet Take 1 tablet by mouth 3 (three) times daily as needed.     pneumococcal 13-valent conjugate vaccine (PREVNAR 13) SUSP injection inject 0.5 milliliter intramuscularly     potassium chloride (KLOR-CON M10) 10 MEQ tablet 1 tablet with food Orally Twice a day     prednisoLONE acetate (PRED FORTE) 1 % ophthalmic suspension      tadalafil (CIALIS) 20 MG tablet 1 tablet     testosterone cypionate (DEPOTESTOTERONE CYPIONATE) 100 MG/ML injection 0.37ml     triamcinolone cream (KENALOG) 0.5 %      trimethoprim-polymyxin b (POLYTRIM) ophthalmic solution INSTILL 1 DROP INTO LEFT EYE 4 TIMES DAILY FOR 2 DAYS AFTER MONTHLY EYE INJECTION     No current facility-administered medications for this visit.     Known medication allergies: Allergies  Allergen  Reactions   Caffeine Other (See Comments)    Urinary retention and jittery   Codeine Other (See Comments)    Jittery   Iodinated Contrast Media Other (See Comments)    Hard on kidneys. The blue dye that went through my IV.    Pseudoephedrine Other (See Comments)    Urinary retention   Simvastatin Other (See Comments)    Hurt my bones   Penicillins Itching   Chlorpheniramine-Pseudoeph    Infliximab     Other reaction(s): ear infections     Physical examination: Blood pressure 126/82, pulse 67, resp. rate 16, height 5\' 10"  (1.778 m), weight 212 lb 12.8 oz (96.5 kg), SpO2 96 %.  General: Alert, interactive, in no acute distress. HEENT: PERRLA, TMs pearly gray, turbinates {Blank single:19197::"non-edematous","edematous","edematous and pale","markedly edematous","markedly edematous and pale","moderately edematous","mildly edematous","minimally edematous"} {Blank single:19197::"with crusty discharge","with thick discharge","with clear discharge","without discharge"}, post-pharynx {Blank single:19197::"unremarkable","non erythematous","erythematous","markedly erythematous","moderately erythematous","mildly erythematous"}. Neck: Supple without lymphadenopathy. Lungs: {Blank single:19197::"Decreased breath sounds with expiratory wheezing bilaterally","Mildly decreased breath sounds with expiratory wheezing bilaterally","Decreased breath sounds bilaterally without wheezing, rhonchi or rales","Mildly decreased breath sounds bilaterally without wheezing, rhonchi or rales","Clear to auscultation without wheezing, rhonchi or rales"}. {{Blank single:19197::"increased work of breathing","no increased work of breathing"}. CV: Normal S1, S2 without murmurs. Abdomen: Nondistended, nontender. Skin: {Blank single:19197::"Dry, erythematous, excoriated patches on the ***","Dry, hyperpigmented, thickened patches on the ***","Dry, mildly hyperpigmented, mildly thickened patches on the ***","Scattered erythematous  urticarial type lesions primarily located *** , nonvesicular","Warm and dry, without lesions or rashes"}. Extremities:  No clubbing, cyanosis or edema. Neuro:   Grossly intact.  Diagnositics/Labs:  Spirometry: FEV1: 1.64L 57%, FVC: 2.88L 73% predicted.  This is quite stable for him  Assessment and plan: Patient Instructions  Rhinitis, non-allergic    - use xyzal 5mg  daily as needed    - continue use of Xhance 2 sprays twice a day as needed for nasal congestion control.      - if Truett Perna is not available can use Flonase 2 sprays each nostril daily for 1-2 weeks at a time before stopping once nasal congestion improves for maximum benefit    - for nasal drainage/runny nose recommend use of nasal antihistamine, Astelin,2 2 sprays each nostril twice a day   Reactive airway   - continue singulair 10mg  daily in evenings   - have access to Ventolin (albuterol) inhaler 2 puffs or albuterol 1 vial via nebulizer every 4-6 hours as needed for cough/wheeze/shortness of breath/chest tightness.  May use 15-20 minutes prior to activity.   Monitor frequency of use.     - use inhaler with spacer chamber    - nebulizer provided   Asthma  control goals:  Full participation in all desired activities (may need albuterol before activity) Albuterol use two time or less a week on average (not counting use with activity) Cough interfering with sleep two time or less a month Oral steroids no more than once a year No hospitalizations  Follow-up 6 months or sooner if needed  No follow-ups on file.  I appreciate the opportunity to take part in [name]'s care. Please do not hesitate to contact me with questions.  Sincerely,   Prudy Feeler, MD Allergy/Immunology [facility]

## 2023-01-08 ENCOUNTER — Other Ambulatory Visit: Payer: Self-pay | Admitting: *Deleted

## 2023-01-08 DIAGNOSIS — J4520 Mild intermittent asthma, uncomplicated: Secondary | ICD-10-CM

## 2023-01-08 MED ORDER — ALBUTEROL SULFATE (2.5 MG/3ML) 0.083% IN NEBU: 2.5000 mg | INHALATION_SOLUTION | RESPIRATORY_TRACT | 1 refills | Status: AC | PRN

## 2023-01-08 NOTE — Addendum Note (Signed)
Addended by: Guy Franco on: 01/08/2023 04:59 PM   Modules accepted: Orders

## 2023-01-14 DIAGNOSIS — I129 Hypertensive chronic kidney disease with stage 1 through stage 4 chronic kidney disease, or unspecified chronic kidney disease: Secondary | ICD-10-CM | POA: Diagnosis not present

## 2023-01-14 DIAGNOSIS — D631 Anemia in chronic kidney disease: Secondary | ICD-10-CM | POA: Diagnosis not present

## 2023-01-14 DIAGNOSIS — N2581 Secondary hyperparathyroidism of renal origin: Secondary | ICD-10-CM | POA: Diagnosis not present

## 2023-01-14 DIAGNOSIS — M109 Gout, unspecified: Secondary | ICD-10-CM | POA: Diagnosis not present

## 2023-01-14 DIAGNOSIS — N183 Chronic kidney disease, stage 3 unspecified: Secondary | ICD-10-CM | POA: Diagnosis not present

## 2023-01-17 DIAGNOSIS — J019 Acute sinusitis, unspecified: Secondary | ICD-10-CM | POA: Diagnosis not present

## 2023-01-17 DIAGNOSIS — J324 Chronic pansinusitis: Secondary | ICD-10-CM | POA: Diagnosis not present

## 2023-01-22 ENCOUNTER — Ambulatory Visit (INDEPENDENT_AMBULATORY_CARE_PROVIDER_SITE_OTHER): Payer: Medicare Other | Admitting: Podiatry

## 2023-01-22 ENCOUNTER — Encounter: Payer: Self-pay | Admitting: Podiatry

## 2023-01-22 ENCOUNTER — Encounter: Payer: Self-pay | Admitting: Hematology & Oncology

## 2023-01-22 DIAGNOSIS — B351 Tinea unguium: Secondary | ICD-10-CM

## 2023-01-22 DIAGNOSIS — M79675 Pain in left toe(s): Secondary | ICD-10-CM

## 2023-01-22 DIAGNOSIS — I739 Peripheral vascular disease, unspecified: Secondary | ICD-10-CM | POA: Diagnosis not present

## 2023-01-22 DIAGNOSIS — M79674 Pain in right toe(s): Secondary | ICD-10-CM | POA: Diagnosis not present

## 2023-01-22 NOTE — Progress Notes (Signed)
  Subjective:  Patient ID: [name], male    DOB: 12/02/1945,  MRN: [id number]  [name] presents to clinic today for at risk foot care. Patient has h/o PAD and painful thick toenails that are difficult to trim. Pain interferes with ambulation. Aggravating factors include wearing enclosed shoe gear. Pain is relieved with periodic professional debridement.  Chief Complaint  Patient presents with   Routine foot care   New problem(s): None.   PCP is Avva, Ravisankar, MD.  Allergies  Allergen Reactions   Caffeine Other (See Comments)    Urinary retention and jittery   Codeine Other (See Comments)    Jittery   Iodinated Contrast Media Other (See Comments)    Hard on kidneys. The blue dye that went through my IV.    Pseudoephedrine Other (See Comments)    Urinary retention   Simvastatin Other (See Comments)    Hurt my bones   Penicillins Itching   Chlorpheniramine-Pseudoeph    Infliximab     Other reaction(s): ear infections    Review of Systems: Negative except as noted in the HPI.  Objective: No changes noted in today's physical examination. There were no vitals filed for this visit. [name] is a pleasant 77 y.o. male WD, WN in NAD. AAO x 3.  Vascular Examination: CFT <4 seconds b/l. DP pulses faintly palpable b/l. PT pulses diminished b/l.  Skin temperature gradient warm to warm b/l. No ischemia or gangrene. No cyanosis or clubbing noted b/l. Pedal hair sparse. Varicosities present b/l.   Neurological Examination: Sensation grossly intact b/l with 10 gram monofilament. Vibratory sensation intact b/l.   Dermatological Examination: Pedal skin warm and supple b/l. Toenails 1-5 b/l elongated, discolored, dystrophic, thickened, crumbly with subungual debris and tenderness to dorsal palpation.   Incurvated nailplate lateral border left hallux.  Nail border hypertrophy absent. There is tenderness to palpation. Sign(s) of infection: no clinical signs of  infection noted on examination today.  Musculoskeletal Examination: Muscle strength 5/5 to b/l LE. HAV with bunion bilaterally and hammertoes 2-5 b/l.  Radiographs: None  Assessment/Plan: 1. Pain due to onychomycosis of toenails of both feet   2. PVD (peripheral vascular disease)    -Patient was evaluated and treated. All patient's and/or POA's questions/concerns answered on today's visit. -Patient to continue soft, supportive shoe gear daily. -Toenails 1-5 b/l were debrided in length and girth with sterile nail nippers and dremel without iatrogenic bleeding.  -Patient/POA to call should there be question/concern in the interim.   Return in about 3 months (around 04/23/2023).  Freddie Breech, DPM

## 2023-01-23 ENCOUNTER — Encounter (INDEPENDENT_AMBULATORY_CARE_PROVIDER_SITE_OTHER): Payer: Medicare Other | Admitting: Ophthalmology

## 2023-01-23 DIAGNOSIS — I10 Essential (primary) hypertension: Secondary | ICD-10-CM | POA: Diagnosis not present

## 2023-01-23 DIAGNOSIS — H35033 Hypertensive retinopathy, bilateral: Secondary | ICD-10-CM

## 2023-01-23 DIAGNOSIS — D3132 Benign neoplasm of left choroid: Secondary | ICD-10-CM | POA: Diagnosis not present

## 2023-01-23 DIAGNOSIS — H353112 Nonexudative age-related macular degeneration, right eye, intermediate dry stage: Secondary | ICD-10-CM | POA: Diagnosis not present

## 2023-01-23 DIAGNOSIS — H43813 Vitreous degeneration, bilateral: Secondary | ICD-10-CM

## 2023-01-23 DIAGNOSIS — H353221 Exudative age-related macular degeneration, left eye, with active choroidal neovascularization: Secondary | ICD-10-CM | POA: Diagnosis not present

## 2023-02-11 DIAGNOSIS — J069 Acute upper respiratory infection, unspecified: Secondary | ICD-10-CM | POA: Diagnosis not present

## 2023-02-11 DIAGNOSIS — R051 Acute cough: Secondary | ICD-10-CM | POA: Diagnosis not present

## 2023-02-11 DIAGNOSIS — J45909 Unspecified asthma, uncomplicated: Secondary | ICD-10-CM | POA: Diagnosis not present

## 2023-02-11 DIAGNOSIS — N1832 Chronic kidney disease, stage 3b: Secondary | ICD-10-CM | POA: Diagnosis not present

## 2023-02-11 DIAGNOSIS — I129 Hypertensive chronic kidney disease with stage 1 through stage 4 chronic kidney disease, or unspecified chronic kidney disease: Secondary | ICD-10-CM | POA: Diagnosis not present

## 2023-02-24 ENCOUNTER — Telehealth: Payer: Self-pay | Admitting: *Deleted

## 2023-02-24 DIAGNOSIS — L570 Actinic keratosis: Secondary | ICD-10-CM | POA: Diagnosis not present

## 2023-02-24 DIAGNOSIS — L821 Other seborrheic keratosis: Secondary | ICD-10-CM | POA: Diagnosis not present

## 2023-02-24 DIAGNOSIS — L820 Inflamed seborrheic keratosis: Secondary | ICD-10-CM | POA: Diagnosis not present

## 2023-02-24 DIAGNOSIS — L601 Onycholysis: Secondary | ICD-10-CM | POA: Diagnosis not present

## 2023-02-24 DIAGNOSIS — L578 Other skin changes due to chronic exposure to nonionizing radiation: Secondary | ICD-10-CM | POA: Diagnosis not present

## 2023-02-24 NOTE — Telephone Encounter (Signed)
PAtient called stating that he is feeling slightly winded and "swimmy headed".  States that the last time he felt this way, his iron was very low and needed an iron infusion which helped tremendously.  Appt made for labs and midlevel provider for thursday

## 2023-02-26 ENCOUNTER — Encounter: Payer: Self-pay | Admitting: Hematology & Oncology

## 2023-02-26 ENCOUNTER — Telehealth: Payer: Self-pay

## 2023-02-26 ENCOUNTER — Inpatient Hospital Stay (HOSPITAL_BASED_OUTPATIENT_CLINIC_OR_DEPARTMENT_OTHER): Payer: Medicare Other | Admitting: Hematology & Oncology

## 2023-02-26 ENCOUNTER — Inpatient Hospital Stay: Payer: Medicare Other | Attending: Hematology & Oncology

## 2023-02-26 ENCOUNTER — Other Ambulatory Visit: Payer: Self-pay

## 2023-02-26 VITALS — BP 151/71 | HR 85 | Temp 98.0°F | Resp 19 | Ht 70.0 in | Wt 205.0 lb

## 2023-02-26 DIAGNOSIS — D500 Iron deficiency anemia secondary to blood loss (chronic): Secondary | ICD-10-CM

## 2023-02-26 DIAGNOSIS — D509 Iron deficiency anemia, unspecified: Secondary | ICD-10-CM | POA: Insufficient documentation

## 2023-02-26 DIAGNOSIS — D751 Secondary polycythemia: Secondary | ICD-10-CM | POA: Insufficient documentation

## 2023-02-26 DIAGNOSIS — I129 Hypertensive chronic kidney disease with stage 1 through stage 4 chronic kidney disease, or unspecified chronic kidney disease: Secondary | ICD-10-CM

## 2023-02-26 LAB — IRON AND IRON BINDING CAPACITY (CC-WL,HP ONLY)
Iron: 11 ug/dL — ABNORMAL LOW (ref 45–182)
Saturation Ratios: 2 % — ABNORMAL LOW (ref 17.9–39.5)
TIBC: 454 ug/dL — ABNORMAL HIGH (ref 250–450)
UIBC: 443 ug/dL — ABNORMAL HIGH (ref 117–376)

## 2023-02-26 LAB — CMP (CANCER CENTER ONLY)
ALT: 13 U/L (ref 0–44)
AST: 13 U/L — ABNORMAL LOW (ref 15–41)
Albumin: 4.3 g/dL (ref 3.5–5.0)
Alkaline Phosphatase: 50 U/L (ref 38–126)
Anion gap: 10 (ref 5–15)
BUN: 22 mg/dL (ref 8–23)
CO2: 28 mmol/L (ref 22–32)
Calcium: 9.8 mg/dL (ref 8.9–10.3)
Chloride: 101 mmol/L (ref 98–111)
Creatinine: 1.81 mg/dL — ABNORMAL HIGH (ref 0.61–1.24)
GFR, Estimated: 38 mL/min — ABNORMAL LOW (ref >60)
Glucose, Bld: 125 mg/dL — ABNORMAL HIGH (ref 70–99)
Potassium: 3.9 mmol/L (ref 3.5–5.1)
Sodium: 139 mmol/L (ref 135–145)
Total Bilirubin: 1 mg/dL (ref 0.3–1.2)
Total Protein: 6.4 g/dL — ABNORMAL LOW (ref 6.5–8.1)

## 2023-02-26 LAB — CBC WITH DIFFERENTIAL (CANCER CENTER ONLY)
Abs Immature Granulocytes: 0.03 10*3/uL (ref 0.00–0.07)
Basophils Absolute: 0 10*3/uL (ref 0.0–0.1)
Basophils Relative: 0 %
Eosinophils Absolute: 0 10*3/uL (ref 0.0–0.5)
Eosinophils Relative: 0 %
HCT: 45.2 % (ref 39.0–52.0)
Hemoglobin: 13.4 g/dL (ref 13.0–17.0)
Immature Granulocytes: 0 %
Lymphocytes Relative: 16 %
Lymphs Abs: 1.2 10*3/uL (ref 0.7–4.0)
MCH: 22.7 pg — ABNORMAL LOW (ref 26.0–34.0)
MCHC: 29.6 g/dL — ABNORMAL LOW (ref 30.0–36.0)
MCV: 76.6 fL — ABNORMAL LOW (ref 80.0–100.0)
Monocytes Absolute: 0.6 10*3/uL (ref 0.1–1.0)
Monocytes Relative: 9 %
Neutro Abs: 5.4 10*3/uL (ref 1.7–7.7)
Neutrophils Relative %: 75 %
Platelet Count: 248 10*3/uL (ref 150–400)
RBC: 5.9 MIL/uL — ABNORMAL HIGH (ref 4.22–5.81)
RDW: 18.3 % — ABNORMAL HIGH (ref 11.5–15.5)
WBC Count: 7.3 10*3/uL (ref 4.0–10.5)
nRBC: 0 % (ref 0.0–0.2)

## 2023-02-26 LAB — FERRITIN: Ferritin: 4 ng/mL — ABNORMAL LOW (ref 24–336)

## 2023-02-26 NOTE — Telephone Encounter (Signed)
-----   Message from Josph Macho, MD sent at 02/26/2023  2:20 PM EDT ----- Call and let him know that the iron level is very low.  We probably need to give him a dose of IV iron.  Please set this up.  Thanks.  Cindee Lame

## 2023-02-26 NOTE — Telephone Encounter (Signed)
Per Dr. Gustavo Lah request patient was called and informed that his iron level is very low. Pt advised that Dr. Myna Hidalgo would like him to get one dose of IV iron. Pt verbalized understanding and had no further questions. Pt aware that the scheduling department would reach out to set up the appointment. Pt appreciative of call.

## 2023-02-26 NOTE — Progress Notes (Signed)
Hematology and Oncology Follow Up Visit  [name] [id number] 03/23/1946 77 y.o. 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 IV iron-Monoferric given on 03/31/2022     Interim History:  [name] is back for follow-up.  He is having some problems.  I do not think these problems are related to the iron or the polycythemia.  He does have some dizziness.  He has current sinus issues.  He is going go back to see his family doctor.  Hopefully, this can be sorted out.  He may need to go see ENT.  He does get tired.  I suspect his iron might be low side.  In fact, we got his iron studies today, his ferritin was only 2.  He has been donating blood to the [facility].  I know this has been helpful for him in the community.  He has had no problems with fever.  He has had no obvious change in bowel or bladder habits.  His appetite has been okay.  Very sad occasional leg swelling.  Overall, I would have said that his performance status is probably ECOG 1.    Medications:  Current Outpatient Medications:    albuterol (PROVENTIL) (2.5 MG/3ML) 0.083% nebulizer solution, Take 3 mLs (2.5 mg total) by nebulization every 4 (four) hours as needed for wheezing or shortness of breath., Disp: 150 mL, Rfl: 1   albuterol (VENTOLIN HFA) 108 (90 Base) MCG/ACT inhaler, Inhale two puffs every 4-6 hours if needed for cough or wheeze., Disp: 18 g, Rfl: 1   allopurinol (ZYLOPRIM) 100 MG tablet, 2 tablet Orally Once a day, Disp: , Rfl:    amLODipine (NORVASC) 2.5 MG tablet, Take 1 tablet by mouth 2 (two) times daily., Disp: , Rfl:    Azelastine HCl 137 MCG/SPRAY SOLN, Place 1 spray into the nose 2 (two) times daily., Disp: , Rfl:    Besifloxacin HCl (BESIVANCE) 0.6 % SUSP, , Disp: , Rfl:    Cholecalciferol (VITAMIN D3 PO), Take by mouth daily., Disp: ,  Rfl:    cromolyn (OPTICROM) 4 % ophthalmic solution, 1 drop 4 (four) times daily., Disp: , Rfl:    dicyclomine (BENTYL) 10 MG capsule, take 1 capsule by mouth every 6 hours if needed for BLOATING, Disp: , Rfl:    doxazosin (CARDURA) 2 MG tablet, 1 tablet, Disp: , Rfl:    Fluticasone Propionate (XHANCE) 93 MCG/ACT EXHU, Place into the nose., Disp: , Rfl:    furosemide (LASIX) 40 MG tablet, Take 40 mg by mouth daily., Disp: , Rfl: 3   HYDROcodone-acetaminophen (NORCO) 10-325 MG tablet, Take by mouth., Disp: , Rfl:    levocetirizine (XYZAL) 5 MG tablet, 1 tablet in the evening, Disp: , Rfl:    losartan (COZAAR) 25 MG tablet, Take 25 mg by mouth at bedtime., Disp: , Rfl:    methocarbamol (ROBAXIN) 500 MG tablet, Take 500 mg by mouth 3 (three) times daily as needed., Disp: , Rfl:    omeprazole (PRILOSEC) 20 MG capsule, 1 capsule, Disp: , Rfl:    oxybutynin (DITROPAN) 5 MG tablet, Take by mouth., Disp: , Rfl:    oxyCODONE-acetaminophen (PERCOCET) 10-325 MG tablet, Take 1 tablet by mouth 3 (three) times daily as needed., Disp: , Rfl:    potassium chloride (KLOR-CON M10) 10 MEQ tablet, 1 tablet with food Orally Twice a day, Disp: , Rfl:  tadalafil (CIALIS) 20 MG tablet, 1 tablet, Disp: , Rfl:    testosterone cypionate (DEPOTESTOTERONE CYPIONATE) 100 MG/ML injection, 0.9ml, Disp: , Rfl:    triamcinolone cream (KENALOG) 0.5 %, , Disp: , Rfl:    trimethoprim-polymyxin b (POLYTRIM) ophthalmic solution, INSTILL 1 DROP INTO LEFT EYE 4 TIMES DAILY FOR 2 DAYS AFTER MONTHLY EYE INJECTION, Disp: , Rfl:    fluticasone (FLONASE) 50 MCG/ACT nasal spray, Place 2 sprays into both nostrils daily as needed (use for 1-2 weeks at a time.). (Patient not taking: Reported on 02/26/2023), Disp: 16 g, Rfl: 5  Allergies:  Allergies  Allergen Reactions   Caffeine Other (See Comments)    Urinary retention and jittery   Codeine Other (See Comments)    Jittery   Iodinated Contrast Media Other (See Comments)    Hard on  kidneys. The blue dye that went through my IV.    Pseudoephedrine Other (See Comments)    Urinary retention   Simvastatin Other (See Comments)    Hurt my bones   Penicillins Itching   Chlorpheniramine-Pseudoeph    Infliximab     Other reaction(s): ear infections    Past Medical History, Surgical history, Social history, and Family History were reviewed and updated.  Review of Systems: Review of Systems  Constitutional: Negative.   HENT:  Negative.    Eyes: Negative.   Respiratory: Negative.    Cardiovascular: Negative.   Gastrointestinal: Negative.   Endocrine: Negative.   Genitourinary: Negative.    Musculoskeletal: Negative.   Skin: Negative.   Neurological: Negative.   Hematological: Negative.   Psychiatric/Behavioral: Negative.      Physical Exam:  height is 5\' 10"  (1.778 m) and weight is 205 lb (93 kg). His oral temperature is 98 F (36.7 C). His blood pressure is 151/71 (abnormal) and his pulse is 85. His respiration is 19 and oxygen saturation is 98%.   Wt Readings from Last 3 Encounters:  02/26/23 205 lb (93 kg)  01/06/23 212 lb 12.8 oz (96.5 kg)  10/27/22 207 lb (93.9 kg)    Physical Exam Vitals reviewed.  HENT:     Head: Normocephalic and atraumatic.  Eyes:     Pupils: Pupils are equal, round, and reactive to light.  Cardiovascular:     Rate and Rhythm: Normal rate and regular rhythm.     Heart sounds: Normal heart sounds.  Pulmonary:     Effort: Pulmonary effort is normal.     Breath sounds: Normal breath sounds.  Abdominal:     General: Bowel sounds are normal.     Palpations: Abdomen is soft.  Musculoskeletal:        General: No tenderness or deformity. Normal range of motion.     Cervical back: Normal range of motion.  Lymphadenopathy:     Cervical: No cervical adenopathy.  Skin:    General: Skin is warm and dry.     Findings: No erythema or rash.  Neurological:     Mental Status: He is alert and oriented to person, place, and time.   Psychiatric:        Behavior: Behavior normal.        Thought Content: Thought content normal.        Judgment: Judgment normal.     Lab Results  Component Value Date   WBC 7.3 02/26/2023   HGB 13.4 02/26/2023   HCT 45.2 02/26/2023   MCV 76.6 (L) 02/26/2023   PLT 248 02/26/2023     Chemistry  Component Value Date/Time   NA 139 02/26/2023 1011   K 3.9 02/26/2023 1011   CL 101 02/26/2023 1011   CO2 28 02/26/2023 1011   BUN 22 02/26/2023 1011   CREATININE 1.81 (H) 02/26/2023 1011      Component Value Date/Time   CALCIUM 9.8 02/26/2023 1011   ALKPHOS 50 02/26/2023 1011   AST 13 (L) 02/26/2023 1011   ALT 13 02/26/2023 1011   BILITOT 1.0 02/26/2023 1011      Impression and Plan: [name] is a very nice 77 year old white male.  He has secondary polycythemia.  He is on testosterone injections which he needs.  We will go ahead and get set him up with some IV iron.  I think this will help him out.  This might help some of the dizziness.  I am not sure if this is going to help the sinus issues.  Again, he needs to see his family doctor for this.  I still believe that he would be doing a good job by donating blood to the [facility].  I know this will help the community.  We will likely have to get him back to see [place] in about 6 weeks or so.   Josph Macho, MD 5/23/202412:18 PM

## 2023-02-27 LAB — TESTOSTERONE: Testosterone: 952 ng/dL — ABNORMAL HIGH (ref 264–916)

## 2023-03-06 ENCOUNTER — Inpatient Hospital Stay: Payer: Medicare Other

## 2023-03-06 VITALS — BP 147/65 | HR 82 | Temp 97.9°F | Resp 20

## 2023-03-06 DIAGNOSIS — D500 Iron deficiency anemia secondary to blood loss (chronic): Secondary | ICD-10-CM

## 2023-03-06 DIAGNOSIS — D509 Iron deficiency anemia, unspecified: Secondary | ICD-10-CM | POA: Diagnosis not present

## 2023-03-06 DIAGNOSIS — D751 Secondary polycythemia: Secondary | ICD-10-CM | POA: Diagnosis not present

## 2023-03-06 MED ORDER — SODIUM CHLORIDE 0.9 % IV SOLN: INTRAVENOUS | Status: DC

## 2023-03-06 MED ORDER — SODIUM CHLORIDE 0.9 % IV SOLN
1000.0000 mg | Freq: Once | INTRAVENOUS | Status: AC
  Administered 2023-03-06: 1000 mg via INTRAVENOUS
  Filled 2023-03-06: qty 10

## 2023-03-06 NOTE — Patient Instructions (Signed)

## 2023-03-13 ENCOUNTER — Encounter (INDEPENDENT_AMBULATORY_CARE_PROVIDER_SITE_OTHER): Payer: Medicare Other | Admitting: Ophthalmology

## 2023-03-13 DIAGNOSIS — D3132 Benign neoplasm of left choroid: Secondary | ICD-10-CM

## 2023-03-13 DIAGNOSIS — H353221 Exudative age-related macular degeneration, left eye, with active choroidal neovascularization: Secondary | ICD-10-CM

## 2023-03-13 DIAGNOSIS — I10 Essential (primary) hypertension: Secondary | ICD-10-CM | POA: Diagnosis not present

## 2023-03-13 DIAGNOSIS — H35033 Hypertensive retinopathy, bilateral: Secondary | ICD-10-CM | POA: Diagnosis not present

## 2023-03-13 DIAGNOSIS — H353112 Nonexudative age-related macular degeneration, right eye, intermediate dry stage: Secondary | ICD-10-CM

## 2023-03-13 DIAGNOSIS — H43813 Vitreous degeneration, bilateral: Secondary | ICD-10-CM

## 2023-03-18 DIAGNOSIS — Z1212 Encounter for screening for malignant neoplasm of rectum: Secondary | ICD-10-CM | POA: Diagnosis not present

## 2023-03-18 DIAGNOSIS — E291 Testicular hypofunction: Secondary | ICD-10-CM | POA: Diagnosis not present

## 2023-03-19 DIAGNOSIS — E785 Hyperlipidemia, unspecified: Secondary | ICD-10-CM | POA: Diagnosis not present

## 2023-03-19 DIAGNOSIS — Z125 Encounter for screening for malignant neoplasm of prostate: Secondary | ICD-10-CM | POA: Diagnosis not present

## 2023-03-19 DIAGNOSIS — K219 Gastro-esophageal reflux disease without esophagitis: Secondary | ICD-10-CM | POA: Diagnosis not present

## 2023-03-19 DIAGNOSIS — M109 Gout, unspecified: Secondary | ICD-10-CM | POA: Diagnosis not present

## 2023-03-19 DIAGNOSIS — E559 Vitamin D deficiency, unspecified: Secondary | ICD-10-CM | POA: Diagnosis not present

## 2023-03-19 DIAGNOSIS — N1832 Chronic kidney disease, stage 3b: Secondary | ICD-10-CM | POA: Diagnosis not present

## 2023-03-25 ENCOUNTER — Other Ambulatory Visit: Payer: Self-pay | Admitting: Internal Medicine

## 2023-03-25 DIAGNOSIS — R051 Acute cough: Secondary | ICD-10-CM | POA: Diagnosis not present

## 2023-03-25 DIAGNOSIS — Z1331 Encounter for screening for depression: Secondary | ICD-10-CM | POA: Diagnosis not present

## 2023-03-25 DIAGNOSIS — E291 Testicular hypofunction: Secondary | ICD-10-CM | POA: Diagnosis not present

## 2023-03-25 DIAGNOSIS — E785 Hyperlipidemia, unspecified: Secondary | ICD-10-CM | POA: Diagnosis not present

## 2023-03-25 DIAGNOSIS — J329 Chronic sinusitis, unspecified: Secondary | ICD-10-CM | POA: Diagnosis not present

## 2023-03-25 DIAGNOSIS — K219 Gastro-esophageal reflux disease without esophagitis: Secondary | ICD-10-CM | POA: Diagnosis not present

## 2023-03-25 DIAGNOSIS — M069 Rheumatoid arthritis, unspecified: Secondary | ICD-10-CM | POA: Diagnosis not present

## 2023-03-25 DIAGNOSIS — Z1339 Encounter for screening examination for other mental health and behavioral disorders: Secondary | ICD-10-CM | POA: Diagnosis not present

## 2023-03-25 DIAGNOSIS — E669 Obesity, unspecified: Secondary | ICD-10-CM | POA: Diagnosis not present

## 2023-03-25 DIAGNOSIS — Z1212 Encounter for screening for malignant neoplasm of rectum: Secondary | ICD-10-CM | POA: Diagnosis not present

## 2023-03-25 DIAGNOSIS — Z0000 Encounter for general adult medical examination without abnormal findings: Secondary | ICD-10-CM | POA: Diagnosis not present

## 2023-03-25 DIAGNOSIS — D751 Secondary polycythemia: Secondary | ICD-10-CM | POA: Diagnosis not present

## 2023-03-25 DIAGNOSIS — I129 Hypertensive chronic kidney disease with stage 1 through stage 4 chronic kidney disease, or unspecified chronic kidney disease: Secondary | ICD-10-CM | POA: Diagnosis not present

## 2023-03-25 DIAGNOSIS — N1832 Chronic kidney disease, stage 3b: Secondary | ICD-10-CM | POA: Diagnosis not present

## 2023-03-25 DIAGNOSIS — H353220 Exudative age-related macular degeneration, left eye, stage unspecified: Secondary | ICD-10-CM | POA: Diagnosis not present

## 2023-03-25 DIAGNOSIS — R82998 Other abnormal findings in urine: Secondary | ICD-10-CM | POA: Diagnosis not present

## 2023-03-31 ENCOUNTER — Ambulatory Visit
Admission: RE | Admit: 2023-03-31 | Discharge: 2023-03-31 | Disposition: A | Discharge status: Home / Self Care | Payer: Medicare Other | Source: Ambulatory Visit | Attending: Internal Medicine | Admitting: Internal Medicine

## 2023-03-31 DIAGNOSIS — J329 Chronic sinusitis, unspecified: Secondary | ICD-10-CM | POA: Diagnosis not present

## 2023-04-02 DIAGNOSIS — M5416 Radiculopathy, lumbar region: Secondary | ICD-10-CM | POA: Diagnosis not present

## 2023-04-07 DIAGNOSIS — K921 Melena: Secondary | ICD-10-CM | POA: Diagnosis not present

## 2023-04-17 ENCOUNTER — Encounter (INDEPENDENT_AMBULATORY_CARE_PROVIDER_SITE_OTHER): Payer: Medicare Other | Admitting: Ophthalmology

## 2023-04-17 DIAGNOSIS — H43813 Vitreous degeneration, bilateral: Secondary | ICD-10-CM

## 2023-04-17 DIAGNOSIS — H353221 Exudative age-related macular degeneration, left eye, with active choroidal neovascularization: Secondary | ICD-10-CM

## 2023-04-17 DIAGNOSIS — H35033 Hypertensive retinopathy, bilateral: Secondary | ICD-10-CM

## 2023-04-17 DIAGNOSIS — I10 Essential (primary) hypertension: Secondary | ICD-10-CM

## 2023-04-17 DIAGNOSIS — H353112 Nonexudative age-related macular degeneration, right eye, intermediate dry stage: Secondary | ICD-10-CM | POA: Diagnosis not present

## 2023-04-20 DIAGNOSIS — N401 Enlarged prostate with lower urinary tract symptoms: Secondary | ICD-10-CM | POA: Diagnosis not present

## 2023-04-20 DIAGNOSIS — E291 Testicular hypofunction: Secondary | ICD-10-CM | POA: Diagnosis not present

## 2023-04-20 DIAGNOSIS — R3912 Poor urinary stream: Secondary | ICD-10-CM | POA: Diagnosis not present

## 2023-04-20 DIAGNOSIS — H10403 Unspecified chronic conjunctivitis, bilateral: Secondary | ICD-10-CM | POA: Diagnosis not present

## 2023-04-20 DIAGNOSIS — R350 Frequency of micturition: Secondary | ICD-10-CM | POA: Diagnosis not present

## 2023-04-22 DIAGNOSIS — J324 Chronic pansinusitis: Secondary | ICD-10-CM | POA: Diagnosis not present

## 2023-04-22 DIAGNOSIS — R0981 Nasal congestion: Secondary | ICD-10-CM | POA: Diagnosis not present

## 2023-04-27 ENCOUNTER — Other Ambulatory Visit: Payer: Self-pay

## 2023-04-27 ENCOUNTER — Inpatient Hospital Stay: Payer: Medicare Other | Attending: Hematology & Oncology

## 2023-04-27 ENCOUNTER — Encounter: Payer: Self-pay | Admitting: Hematology & Oncology

## 2023-04-27 ENCOUNTER — Inpatient Hospital Stay (HOSPITAL_BASED_OUTPATIENT_CLINIC_OR_DEPARTMENT_OTHER): Payer: Medicare Other | Admitting: Hematology & Oncology

## 2023-04-27 VITALS — BP 144/74 | HR 79 | Temp 98.4°F | Resp 19 | Ht 70.0 in | Wt 203.0 lb

## 2023-04-27 DIAGNOSIS — D751 Secondary polycythemia: Secondary | ICD-10-CM | POA: Diagnosis not present

## 2023-04-27 DIAGNOSIS — D692 Other nonthrombocytopenic purpura: Secondary | ICD-10-CM | POA: Diagnosis not present

## 2023-04-27 DIAGNOSIS — D508 Other iron deficiency anemias: Secondary | ICD-10-CM | POA: Diagnosis not present

## 2023-04-27 DIAGNOSIS — D500 Iron deficiency anemia secondary to blood loss (chronic): Secondary | ICD-10-CM

## 2023-04-27 LAB — CBC WITH DIFFERENTIAL (CANCER CENTER ONLY)
Abs Immature Granulocytes: 0.08 10*3/uL — ABNORMAL HIGH (ref 0.00–0.07)
Basophils Absolute: 0 10*3/uL (ref 0.0–0.1)
Basophils Relative: 1 %
Eosinophils Absolute: 0 10*3/uL (ref 0.0–0.5)
Eosinophils Relative: 1 %
HCT: 53.3 % — ABNORMAL HIGH (ref 39.0–52.0)
Hemoglobin: 16.1 g/dL (ref 13.0–17.0)
Immature Granulocytes: 1 %
Lymphocytes Relative: 21 %
Lymphs Abs: 1.7 10*3/uL (ref 0.7–4.0)
MCH: 25.6 pg — ABNORMAL LOW (ref 26.0–34.0)
MCHC: 30.2 g/dL (ref 30.0–36.0)
MCV: 84.9 fL (ref 80.0–100.0)
Monocytes Absolute: 1 10*3/uL (ref 0.1–1.0)
Monocytes Relative: 12 %
Neutro Abs: 5.5 10*3/uL (ref 1.7–7.7)
Neutrophils Relative %: 64 %
Platelet Count: 190 10*3/uL (ref 150–400)
RBC: 6.28 MIL/uL — ABNORMAL HIGH (ref 4.22–5.81)
RDW: 23.8 % — ABNORMAL HIGH (ref 11.5–15.5)
WBC Count: 8.3 10*3/uL (ref 4.0–10.5)
nRBC: 0 % (ref 0.0–0.2)

## 2023-04-27 LAB — CMP (CANCER CENTER ONLY)
ALT: 11 U/L (ref 0–44)
AST: 14 U/L — ABNORMAL LOW (ref 15–41)
Albumin: 4.2 g/dL (ref 3.5–5.0)
Alkaline Phosphatase: 52 U/L (ref 38–126)
Anion gap: 7 (ref 5–15)
BUN: 28 mg/dL — ABNORMAL HIGH (ref 8–23)
CO2: 31 mmol/L (ref 22–32)
Calcium: 9.8 mg/dL (ref 8.9–10.3)
Chloride: 102 mmol/L (ref 98–111)
Creatinine: 1.69 mg/dL — ABNORMAL HIGH (ref 0.61–1.24)
GFR, Estimated: 41 mL/min — ABNORMAL LOW (ref >60)
Glucose, Bld: 85 mg/dL (ref 70–99)
Potassium: 4.2 mmol/L (ref 3.5–5.1)
Sodium: 140 mmol/L (ref 135–145)
Total Bilirubin: 1.3 mg/dL — ABNORMAL HIGH (ref 0.3–1.2)
Total Protein: 6.3 g/dL — ABNORMAL LOW (ref 6.5–8.1)

## 2023-04-27 LAB — RETICULOCYTES
Immature Retic Fract: 22.4 % — ABNORMAL HIGH (ref 2.3–15.9)
RBC.: 6.11 MIL/uL — ABNORMAL HIGH (ref 4.22–5.81)
Retic Count, Absolute: 93.5 10*3/uL (ref 19.0–186.0)
Retic Ct Pct: 1.5 % (ref 0.4–3.1)

## 2023-04-27 LAB — FERRITIN: Ferritin: 11 ng/mL — ABNORMAL LOW (ref 24–336)

## 2023-04-27 LAB — IRON AND IRON BINDING CAPACITY (CC-WL,HP ONLY)
Iron: 40 ug/dL — ABNORMAL LOW (ref 45–182)
Saturation Ratios: 11 % — ABNORMAL LOW (ref 17.9–39.5)
TIBC: 368 ug/dL (ref 250–450)
UIBC: 328 ug/dL (ref 117–376)

## 2023-04-27 NOTE — Progress Notes (Unsigned)
Hematology and Oncology Follow Up Visit  [name] [id number] December 01, 1945 77 y.o. 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 IV iron-Monoferric given on 03/31/2022 EC ASA 81 mg po q day     Interim History:  [name] is back for follow-up.  We last saw him back in May.  Since then, he has been doing okay.  He still gets his testosterone which helps his quality of life.  He has been donating blood.  He has had no fever.  He has had no cough or shortness of breath.  He has had no sinus issues.  There is been no change in bowel or bladder habits.  He has had no leg swelling.  His iron studies that were done today showed a ferritin of 11 with an iron saturation of 11%.  There is been no headache.  Overall, I would say his performance status is probably ECOG 1.   Medications:  Current Outpatient Medications:    albuterol (PROVENTIL) (2.5 MG/3ML) 0.083% nebulizer solution, Take 3 mLs (2.5 mg total) by nebulization every 4 (four) hours as needed for wheezing or shortness of breath., Disp: 150 mL, Rfl: 1   albuterol (VENTOLIN HFA) 108 (90 Base) MCG/ACT inhaler, Inhale two puffs every 4-6 hours if needed for cough or wheeze., Disp: 18 g, Rfl: 1   allopurinol (ZYLOPRIM) 100 MG tablet, 2 tablet Orally Once a day, Disp: , Rfl:    amLODipine (NORVASC) 2.5 MG tablet, Take 1 tablet by mouth 2 (two) times daily., Disp: , Rfl:    Azelastine HCl 137 MCG/SPRAY SOLN, Place 1 spray into the nose 2 (two) times daily., Disp: , Rfl:    Besifloxacin HCl (BESIVANCE) 0.6 % SUSP, , Disp: , Rfl:    Cholecalciferol (VITAMIN D3 PO), Take by mouth daily., Disp: , Rfl:    cromolyn (OPTICROM) 4 % ophthalmic solution, 1 drop 4 (four) times daily., Disp: , Rfl:    dicyclomine (BENTYL) 10 MG capsule, take 1 capsule by mouth every 6 hours if needed  for BLOATING, Disp: , Rfl:    doxazosin (CARDURA) 2 MG tablet, 1 tablet, Disp: , Rfl:    fluticasone (FLONASE) 50 MCG/ACT nasal spray, Place 2 sprays into both nostrils daily as needed (use for 1-2 weeks at a time.). (Patient not taking: Reported on 02/26/2023), Disp: 16 g, Rfl: 5   Fluticasone Propionate (XHANCE) 93 MCG/ACT EXHU, Place into the nose., Disp: , Rfl:    furosemide (LASIX) 40 MG tablet, Take 40 mg by mouth daily., Disp: , Rfl: 3   HYDROcodone-acetaminophen (NORCO) 10-325 MG tablet, Take by mouth., Disp: , Rfl:    levocetirizine (XYZAL) 5 MG tablet, 1 tablet in the evening, Disp: , Rfl:    omeprazole (PRILOSEC) 20 MG capsule, 1 capsule, Disp: , Rfl:    oxybutynin (DITROPAN) 5 MG tablet, Take by mouth., Disp: , Rfl:    potassium chloride (KLOR-CON M10) 10 MEQ tablet, 1 tablet with food Orally Twice a day, Disp: , Rfl:    tadalafil (CIALIS) 20 MG tablet, 1 tablet, Disp: , Rfl:    testosterone cypionate (DEPOTESTOTERONE CYPIONATE) 100 MG/ML injection, 0.78ml, Disp: , Rfl:    triamcinolone cream (KENALOG) 0.5 %, , Disp: , Rfl:    trimethoprim-polymyxin b (POLYTRIM) ophthalmic solution, INSTILL 1 DROP INTO LEFT EYE 4 TIMES DAILY FOR 2 DAYS AFTER MONTHLY EYE INJECTION, Disp: ,  Rfl:   Allergies:  Allergies  Allergen Reactions   Caffeine Other (See Comments)    Urinary retention and jittery   Codeine Other (See Comments)    Jittery   Iodinated Contrast Media Other (See Comments)    Hard on kidneys. The blue dye that went through my IV.    Pseudoephedrine Other (See Comments)    Urinary retention   Simvastatin Other (See Comments)    Hurt my bones   Penicillins Itching   Chlorpheniramine-Pseudoeph    Infliximab     Other reaction(s): ear infections    Past Medical History, Surgical history, Social history, and Family History were reviewed and updated.  Review of Systems: Review of Systems  Constitutional: Negative.   HENT:  Negative.    Eyes: Negative.   Respiratory:  Negative.    Cardiovascular: Negative.   Gastrointestinal: Negative.   Endocrine: Negative.   Genitourinary: Negative.    Musculoskeletal: Negative.   Skin: Negative.   Neurological: Negative.   Hematological: Negative.   Psychiatric/Behavioral: Negative.      Physical Exam:  height is 5\' 10"  (1.778 m) and weight is 203 lb (92.1 kg). His oral temperature is 98.4 F (36.9 C). His blood pressure is 144/74 (abnormal) and his pulse is 79. His respiration is 19 and oxygen saturation is 95%.   Wt Readings from Last 3 Encounters:  04/27/23 203 lb (92.1 kg)  02/26/23 205 lb (93 kg)  01/06/23 212 lb 12.8 oz (96.5 kg)    Physical Exam Vitals reviewed.  HENT:     Head: Normocephalic and atraumatic.  Eyes:     Pupils: Pupils are equal, round, and reactive to light.  Cardiovascular:     Rate and Rhythm: Normal rate and regular rhythm.     Heart sounds: Normal heart sounds.  Pulmonary:     Effort: Pulmonary effort is normal.     Breath sounds: Normal breath sounds.  Abdominal:     General: Bowel sounds are normal.     Palpations: Abdomen is soft.  Musculoskeletal:        General: No tenderness or deformity. Normal range of motion.     Cervical back: Normal range of motion.  Lymphadenopathy:     Cervical: No cervical adenopathy.  Skin:    General: Skin is warm and dry.     Findings: No erythema or rash.  Neurological:     Mental Status: He is alert and oriented to person, place, and time.  Psychiatric:        Behavior: Behavior normal.        Thought Content: Thought content normal.        Judgment: Judgment normal.     Lab Results  Component Value Date   WBC 8.3 04/27/2023   HGB 16.1 04/27/2023   HCT 53.3 (H) 04/27/2023   MCV 84.9 04/27/2023   PLT 190 04/27/2023     Chemistry      Component Value Date/Time   NA 140 04/27/2023 1307   K 4.2 04/27/2023 1307   CL 102 04/27/2023 1307   CO2 31 04/27/2023 1307   BUN 28 (H) 04/27/2023 1307   CREATININE 1.69 (H)  04/27/2023 1307      Component Value Date/Time   CALCIUM 9.8 04/27/2023 1307   ALKPHOS 52 04/27/2023 1307   AST 14 (L) 04/27/2023 1307   ALT 11 04/27/2023 1307   BILITOT 1.3 (H) 04/27/2023 1307      Impression and Plan: [name] is a very nice  76 year old white male.  He has secondary polycythemia.  He is on testosterone injections which he needs.  He clearly needs to have blood taken out.  He wants to donate blood again.  I do not see a problem with this.  I do think that he needs to be on baby aspirin.  I do not see a problem with him being on baby aspirin.  I told him to take the baby aspirin in the morning with food.  I told him to make sure he takes enteric-coated baby aspirin.  We will go ahead and plan and get him back to see [place] in October.  I think this is reasonable.  Again, he really needs to be on baby aspirin.  He understands this.   Josph Macho, MD 7/22/20241:59 PM

## 2023-04-28 ENCOUNTER — Encounter: Payer: Self-pay | Admitting: Hematology & Oncology

## 2023-04-30 ENCOUNTER — Ambulatory Visit: Payer: Medicare Other | Admitting: Podiatry

## 2023-05-11 DIAGNOSIS — Z79899 Other long term (current) drug therapy: Secondary | ICD-10-CM | POA: Diagnosis not present

## 2023-05-11 DIAGNOSIS — M5030 Other cervical disc degeneration, unspecified cervical region: Secondary | ICD-10-CM | POA: Diagnosis not present

## 2023-05-11 DIAGNOSIS — M47812 Spondylosis without myelopathy or radiculopathy, cervical region: Secondary | ICD-10-CM | POA: Diagnosis not present

## 2023-05-11 DIAGNOSIS — M7918 Myalgia, other site: Secondary | ICD-10-CM | POA: Diagnosis not present

## 2023-05-11 DIAGNOSIS — M5136 Other intervertebral disc degeneration, lumbar region: Secondary | ICD-10-CM | POA: Diagnosis not present

## 2023-05-11 DIAGNOSIS — M5459 Other low back pain: Secondary | ICD-10-CM | POA: Diagnosis not present

## 2023-05-13 ENCOUNTER — Telehealth: Payer: Self-pay

## 2023-05-13 NOTE — Telephone Encounter (Signed)
Patient called requesting one blood rx to be changed frome every 16 weeks to every 4 weeks or as needed so he can go inbetween those times if numbers are higher. MD agreed abd rx sent to one [facility]

## 2023-05-14 ENCOUNTER — Other Ambulatory Visit: Payer: Self-pay | Admitting: *Deleted

## 2023-05-14 MED ORDER — AZELASTINE HCL 137 MCG/SPRAY NA SOLN: 1.0000 | Freq: Two times a day (BID) | NASAL | 5 refills | Status: DC

## 2023-05-20 ENCOUNTER — Ambulatory Visit (INDEPENDENT_AMBULATORY_CARE_PROVIDER_SITE_OTHER): Payer: Medicare Other | Admitting: Podiatry

## 2023-05-20 DIAGNOSIS — M79675 Pain in left toe(s): Secondary | ICD-10-CM

## 2023-05-20 DIAGNOSIS — B351 Tinea unguium: Secondary | ICD-10-CM

## 2023-05-20 DIAGNOSIS — M79674 Pain in right toe(s): Secondary | ICD-10-CM | POA: Diagnosis not present

## 2023-05-20 NOTE — Progress Notes (Signed)
    Subjective:  Patient ID: [name], male    DOB: 1946-03-10,  MRN: [id number]  [name] presents to clinic today for:  Chief Complaint  Patient presents with   [name]    RFC   Patient notes nails are thick, discolored, elongated and painful in shoegear when trying to ambulate.  He is requesting the left hallux lateral nail border be cut back to alleviate some discomfort.  Denies drainage  PCP is Avva, Ravisankar, MD.  Allergies  Allergen Reactions   Caffeine Other (See Comments)    Urinary retention and jittery   Codeine Other (See Comments)    Jittery   Iodinated Contrast Media Other (See Comments)    Hard on kidneys. The blue dye that went through my IV.    Pseudoephedrine Other (See Comments)    Urinary retention   Simvastatin Other (See Comments)    Hurt my bones   Penicillins Itching   Chlorpheniramine-Pseudoeph    Infliximab     Other reaction(s): ear infections    Review of Systems: Negative except as noted in the HPI.  Objective:  There were no vitals filed for this visit.  [name] is a pleasant 77 y.o. male in NAD. AAO x 3.  Vascular Examination: Capillary refill time is 3-5 seconds to toes bilateral. Palpable pedal pulses b/l LE. Digital hair present b/l. No pedal edema b/l. Skin temperature gradient WNL b/l. No varicosities b/l. No cyanosis or clubbing noted b/l.   Dermatological Examination: Pedal skin with normal turgor, texture and tone b/l. No open wounds. No interdigital macerations b/l. Toenails x10 are 3mm thick, discolored, dystrophic with subungual debris. There is pain with compression of the nail plates.  They are elongated x10.  Mild incurvation of the left hallux lateral nail border.  No surrounding erythema or edema is noted.  No drainage is noted.  Assessment/Plan: 1. Pain due to onychomycosis of toenails of both feet     The mycotic toenails were sharply debrided x10 with sterile nail nippers and a power debriding burr  to decrease bulk/thickness and length.  The left hallux lateral nail border was cut back at patient's request to alleviate discomfort from mildly incurvated toenail  Return in about 3 months (around 08/20/2023) for RFC.   [name], DPM, FACFAS [facility]     [address] [place], [place] [address]                Office [phone]  Fax [phone]

## 2023-05-21 DIAGNOSIS — R195 Other fecal abnormalities: Secondary | ICD-10-CM | POA: Diagnosis not present

## 2023-05-21 DIAGNOSIS — R7989 Other specified abnormal findings of blood chemistry: Secondary | ICD-10-CM | POA: Diagnosis not present

## 2023-05-21 DIAGNOSIS — R109 Unspecified abdominal pain: Secondary | ICD-10-CM | POA: Diagnosis not present

## 2023-05-21 DIAGNOSIS — K219 Gastro-esophageal reflux disease without esophagitis: Secondary | ICD-10-CM | POA: Diagnosis not present

## 2023-05-22 ENCOUNTER — Encounter (INDEPENDENT_AMBULATORY_CARE_PROVIDER_SITE_OTHER): Payer: Medicare Other | Admitting: Ophthalmology

## 2023-05-22 DIAGNOSIS — H43813 Vitreous degeneration, bilateral: Secondary | ICD-10-CM

## 2023-05-22 DIAGNOSIS — D3132 Benign neoplasm of left choroid: Secondary | ICD-10-CM

## 2023-05-22 DIAGNOSIS — H353221 Exudative age-related macular degeneration, left eye, with active choroidal neovascularization: Secondary | ICD-10-CM | POA: Diagnosis not present

## 2023-05-22 DIAGNOSIS — H353112 Nonexudative age-related macular degeneration, right eye, intermediate dry stage: Secondary | ICD-10-CM

## 2023-05-22 DIAGNOSIS — H35033 Hypertensive retinopathy, bilateral: Secondary | ICD-10-CM | POA: Diagnosis not present

## 2023-05-22 DIAGNOSIS — I10 Essential (primary) hypertension: Secondary | ICD-10-CM | POA: Diagnosis not present

## 2023-06-01 DIAGNOSIS — R42 Dizziness and giddiness: Secondary | ICD-10-CM | POA: Diagnosis not present

## 2023-06-01 DIAGNOSIS — M1990 Unspecified osteoarthritis, unspecified site: Secondary | ICD-10-CM | POA: Diagnosis not present

## 2023-06-01 DIAGNOSIS — M069 Rheumatoid arthritis, unspecified: Secondary | ICD-10-CM | POA: Diagnosis not present

## 2023-06-01 DIAGNOSIS — I129 Hypertensive chronic kidney disease with stage 1 through stage 4 chronic kidney disease, or unspecified chronic kidney disease: Secondary | ICD-10-CM | POA: Diagnosis not present

## 2023-06-01 DIAGNOSIS — J309 Allergic rhinitis, unspecified: Secondary | ICD-10-CM | POA: Diagnosis not present

## 2023-06-01 DIAGNOSIS — J329 Chronic sinusitis, unspecified: Secondary | ICD-10-CM | POA: Diagnosis not present

## 2023-06-01 DIAGNOSIS — N1832 Chronic kidney disease, stage 3b: Secondary | ICD-10-CM | POA: Diagnosis not present

## 2023-06-25 DIAGNOSIS — K317 Polyp of stomach and duodenum: Secondary | ICD-10-CM | POA: Diagnosis not present

## 2023-06-25 DIAGNOSIS — J45909 Unspecified asthma, uncomplicated: Secondary | ICD-10-CM | POA: Diagnosis not present

## 2023-06-25 DIAGNOSIS — I10 Essential (primary) hypertension: Secondary | ICD-10-CM | POA: Diagnosis not present

## 2023-06-25 DIAGNOSIS — D122 Benign neoplasm of ascending colon: Secondary | ICD-10-CM | POA: Diagnosis not present

## 2023-06-25 DIAGNOSIS — D124 Benign neoplasm of descending colon: Secondary | ICD-10-CM | POA: Diagnosis not present

## 2023-06-25 DIAGNOSIS — K5730 Diverticulosis of large intestine without perforation or abscess without bleeding: Secondary | ICD-10-CM | POA: Diagnosis not present

## 2023-06-25 DIAGNOSIS — K219 Gastro-esophageal reflux disease without esophagitis: Secondary | ICD-10-CM | POA: Diagnosis not present

## 2023-06-25 DIAGNOSIS — K921 Melena: Secondary | ICD-10-CM | POA: Diagnosis not present

## 2023-06-25 DIAGNOSIS — K222 Esophageal obstruction: Secondary | ICD-10-CM | POA: Diagnosis not present

## 2023-06-25 DIAGNOSIS — D126 Benign neoplasm of colon, unspecified: Secondary | ICD-10-CM | POA: Diagnosis not present

## 2023-06-25 DIAGNOSIS — R195 Other fecal abnormalities: Secondary | ICD-10-CM | POA: Diagnosis not present

## 2023-06-25 DIAGNOSIS — K635 Polyp of colon: Secondary | ICD-10-CM | POA: Diagnosis not present

## 2023-06-25 DIAGNOSIS — R1310 Dysphagia, unspecified: Secondary | ICD-10-CM | POA: Diagnosis not present

## 2023-06-25 DIAGNOSIS — K449 Diaphragmatic hernia without obstruction or gangrene: Secondary | ICD-10-CM | POA: Diagnosis not present

## 2023-06-30 ENCOUNTER — Encounter (INDEPENDENT_AMBULATORY_CARE_PROVIDER_SITE_OTHER): Payer: Medicare Other | Admitting: Ophthalmology

## 2023-06-30 DIAGNOSIS — D3132 Benign neoplasm of left choroid: Secondary | ICD-10-CM

## 2023-06-30 DIAGNOSIS — H353112 Nonexudative age-related macular degeneration, right eye, intermediate dry stage: Secondary | ICD-10-CM | POA: Diagnosis not present

## 2023-06-30 DIAGNOSIS — H353221 Exudative age-related macular degeneration, left eye, with active choroidal neovascularization: Secondary | ICD-10-CM

## 2023-06-30 DIAGNOSIS — H35033 Hypertensive retinopathy, bilateral: Secondary | ICD-10-CM

## 2023-06-30 DIAGNOSIS — H43813 Vitreous degeneration, bilateral: Secondary | ICD-10-CM | POA: Diagnosis not present

## 2023-06-30 DIAGNOSIS — I10 Essential (primary) hypertension: Secondary | ICD-10-CM

## 2023-07-16 DIAGNOSIS — H43813 Vitreous degeneration, bilateral: Secondary | ICD-10-CM | POA: Diagnosis not present

## 2023-07-16 DIAGNOSIS — H353112 Nonexudative age-related macular degeneration, right eye, intermediate dry stage: Secondary | ICD-10-CM | POA: Diagnosis not present

## 2023-07-16 DIAGNOSIS — Z961 Presence of intraocular lens: Secondary | ICD-10-CM | POA: Diagnosis not present

## 2023-07-16 DIAGNOSIS — H353221 Exudative age-related macular degeneration, left eye, with active choroidal neovascularization: Secondary | ICD-10-CM | POA: Diagnosis not present

## 2023-07-16 DIAGNOSIS — H3540 Unspecified peripheral retinal degeneration: Secondary | ICD-10-CM | POA: Diagnosis not present

## 2023-07-16 DIAGNOSIS — H35341 Macular cyst, hole, or pseudohole, right eye: Secondary | ICD-10-CM | POA: Diagnosis not present

## 2023-07-16 DIAGNOSIS — H35372 Puckering of macula, left eye: Secondary | ICD-10-CM | POA: Diagnosis not present

## 2023-07-20 ENCOUNTER — Inpatient Hospital Stay (HOSPITAL_BASED_OUTPATIENT_CLINIC_OR_DEPARTMENT_OTHER): Payer: Medicare Other | Admitting: Hematology & Oncology

## 2023-07-20 ENCOUNTER — Inpatient Hospital Stay: Payer: Medicare Other

## 2023-07-20 ENCOUNTER — Other Ambulatory Visit: Payer: Self-pay

## 2023-07-20 ENCOUNTER — Inpatient Hospital Stay: Payer: Medicare Other | Attending: Hematology & Oncology

## 2023-07-20 ENCOUNTER — Encounter: Payer: Self-pay | Admitting: Hematology & Oncology

## 2023-07-20 VITALS — BP 130/64 | HR 82 | Temp 98.7°F | Resp 18 | Wt 198.0 lb

## 2023-07-20 DIAGNOSIS — N1832 Chronic kidney disease, stage 3b: Secondary | ICD-10-CM | POA: Diagnosis not present

## 2023-07-20 DIAGNOSIS — D500 Iron deficiency anemia secondary to blood loss (chronic): Secondary | ICD-10-CM | POA: Diagnosis not present

## 2023-07-20 DIAGNOSIS — D751 Secondary polycythemia: Secondary | ICD-10-CM | POA: Diagnosis not present

## 2023-07-20 DIAGNOSIS — D692 Other nonthrombocytopenic purpura: Secondary | ICD-10-CM

## 2023-07-20 LAB — CBC WITH DIFFERENTIAL (CANCER CENTER ONLY)
Abs Immature Granulocytes: 0.04 10*3/uL (ref 0.00–0.07)
Basophils Absolute: 0 10*3/uL (ref 0.0–0.1)
Basophils Relative: 1 %
Eosinophils Absolute: 0.1 10*3/uL (ref 0.0–0.5)
Eosinophils Relative: 1 %
HCT: 48.5 % (ref 39.0–52.0)
Hemoglobin: 14.9 g/dL (ref 13.0–17.0)
Immature Granulocytes: 1 %
Lymphocytes Relative: 29 %
Lymphs Abs: 2.1 10*3/uL (ref 0.7–4.0)
MCH: 26.1 pg (ref 26.0–34.0)
MCHC: 30.7 g/dL (ref 30.0–36.0)
MCV: 84.9 fL (ref 80.0–100.0)
Monocytes Absolute: 0.9 10*3/uL (ref 0.1–1.0)
Monocytes Relative: 12 %
Neutro Abs: 4.2 10*3/uL (ref 1.7–7.7)
Neutrophils Relative %: 56 %
Platelet Count: 215 10*3/uL (ref 150–400)
RBC: 5.71 MIL/uL (ref 4.22–5.81)
RDW: 15.8 % — ABNORMAL HIGH (ref 11.5–15.5)
WBC Count: 7.4 10*3/uL (ref 4.0–10.5)
nRBC: 0 % (ref 0.0–0.2)

## 2023-07-20 LAB — FERRITIN: Ferritin: 8 ng/mL — ABNORMAL LOW (ref 24–336)

## 2023-07-20 LAB — CMP (CANCER CENTER ONLY)
ALT: 8 U/L (ref 0–44)
AST: 13 U/L — ABNORMAL LOW (ref 15–41)
Albumin: 3.9 g/dL (ref 3.5–5.0)
Alkaline Phosphatase: 61 U/L (ref 38–126)
Anion gap: 8 (ref 5–15)
BUN: 20 mg/dL (ref 8–23)
CO2: 33 mmol/L — ABNORMAL HIGH (ref 22–32)
Calcium: 9.6 mg/dL (ref 8.9–10.3)
Chloride: 100 mmol/L (ref 98–111)
Creatinine: 1.77 mg/dL — ABNORMAL HIGH (ref 0.61–1.24)
GFR, Estimated: 39 mL/min — ABNORMAL LOW (ref >60)
Glucose, Bld: 111 mg/dL — ABNORMAL HIGH (ref 70–99)
Potassium: 4.9 mmol/L (ref 3.5–5.1)
Sodium: 141 mmol/L (ref 135–145)
Total Bilirubin: 1.1 mg/dL (ref 0.3–1.2)
Total Protein: 6.4 g/dL — ABNORMAL LOW (ref 6.5–8.1)

## 2023-07-20 LAB — RETICULOCYTES
Immature Retic Fract: 10.8 % (ref 2.3–15.9)
RBC.: 5.64 MIL/uL (ref 4.22–5.81)
Retic Count, Absolute: 59.2 10*3/uL (ref 19.0–186.0)
Retic Ct Pct: 1.1 % (ref 0.4–3.1)

## 2023-07-20 LAB — URIC ACID: Uric Acid, Serum: 5.2 mg/dL (ref 3.7–8.6)

## 2023-07-20 NOTE — Progress Notes (Signed)
Hematology and Oncology Follow Up Visit  [name] [id number] November 24, 1945 77 y.o. 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 IV iron-Monoferric given on 03/31/2022 EC ASA 81 mg po q day     Interim History:  [name] is back for follow-up.  We saw him back in July.  Since then, he has been doing pretty well.  He has been donating blood every couple months.  I really think this is a great idea.  When we last saw him in July, his ferritin was 11 with an iron saturation of 11%.  He feels well.  He really has no complaints.  He does have arthritis.  He does have renal insufficiency.  He does see Rheumatology and Nephrology.  He has had no change in bowel or bladder habits.  He has had no rashes.  There is been no leg swelling.  He has had no cough or shortness of breath.  Overall, his performance status has been quite good.  I would say his performance status is probably ECOG 1.    Medications:  Current Outpatient Medications:    albuterol (PROVENTIL) (2.5 MG/3ML) 0.083% nebulizer solution, Take 3 mLs (2.5 mg total) by nebulization every 4 (four) hours as needed for wheezing or shortness of breath., Disp: 150 mL, Rfl: 1   albuterol (VENTOLIN HFA) 108 (90 Base) MCG/ACT inhaler, Inhale two puffs every 4-6 hours if needed for cough or wheeze., Disp: 18 g, Rfl: 1   allopurinol (ZYLOPRIM) 100 MG tablet, 2 tablet Orally Once a day, Disp: , Rfl:    amLODipine (NORVASC) 2.5 MG tablet, Take 1 tablet by mouth 2 (two) times daily., Disp: , Rfl:    Azelastine HCl 137 MCG/SPRAY SOLN, Place 1 spray into the nose 2 (two) times daily. Use 2 sprays in each nostril 1-2 times daily, Disp: 30 mL, Rfl: 5   Besifloxacin HCl (BESIVANCE) 0.6 % SUSP, , Disp: , Rfl:    Cholecalciferol (VITAMIN D3 PO), Take by mouth daily., Disp: , Rfl:    cromolyn  (OPTICROM) 4 % ophthalmic solution, 1 drop 4 (four) times daily., Disp: , Rfl:    dicyclomine (BENTYL) 10 MG capsule, take 1 capsule by mouth every 6 hours if needed for BLOATING, Disp: , Rfl:    doxazosin (CARDURA) 2 MG tablet, 1 tablet, Disp: , Rfl:    fluticasone (FLONASE) 50 MCG/ACT nasal spray, Place 2 sprays into both nostrils daily as needed (use for 1-2 weeks at a time.)., Disp: 16 g, Rfl: 5   Fluticasone Propionate (XHANCE) 93 MCG/ACT EXHU, Place into the nose., Disp: , Rfl:    furosemide (LASIX) 40 MG tablet, Take 40 mg by mouth daily., Disp: , Rfl: 3   HYDROcodone-acetaminophen (NORCO) 10-325 MG tablet, Take by mouth., Disp: , Rfl:    levocetirizine (XYZAL) 5 MG tablet, 1 tablet in the evening, Disp: , Rfl:    omeprazole (PRILOSEC) 20 MG capsule, 1 capsule, Disp: , Rfl:    oxybutynin (DITROPAN) 5 MG tablet, Take by mouth., Disp: , Rfl:    potassium chloride (KLOR-CON M10) 10 MEQ tablet, 1 tablet with food Orally Twice a day, Disp: , Rfl:    tadalafil (CIALIS) 20 MG tablet, 1 tablet, Disp: , Rfl:    testosterone cypionate (DEPOTESTOTERONE CYPIONATE) 100 MG/ML injection, 0.20ml, Disp: , Rfl:    triamcinolone cream (KENALOG) 0.5 %, , Disp: ,  Rfl:    trimethoprim-polymyxin b (POLYTRIM) ophthalmic solution, INSTILL 1 DROP INTO LEFT EYE 4 TIMES DAILY FOR 2 DAYS AFTER MONTHLY EYE INJECTION, Disp: , Rfl:   Allergies:  Allergies  Allergen Reactions   Caffeine Other (See Comments)    Urinary retention and jittery   Codeine Other (See Comments)    Jittery   Iodinated Contrast Media Other (See Comments)    Hard on kidneys. The blue dye that went through my IV.    Pseudoephedrine Other (See Comments)    Urinary retention   Simvastatin Other (See Comments)    Hurt my bones   Penicillins Itching   Chlorpheniramine-Pseudoeph    Infliximab     Other reaction(s): ear infections    Past Medical History, Surgical history, Social history, and Family History were reviewed and  updated.  Review of Systems: Review of Systems  Constitutional: Negative.   HENT:  Negative.    Eyes: Negative.   Respiratory: Negative.    Cardiovascular: Negative.   Gastrointestinal: Negative.   Endocrine: Negative.   Genitourinary: Negative.    Musculoskeletal: Negative.   Skin: Negative.   Neurological: Negative.   Hematological: Negative.   Psychiatric/Behavioral: Negative.      Physical Exam:  weight is 198 lb (89.8 kg). His oral temperature is 98.7 F (37.1 C). His blood pressure is 130/64 and his pulse is 82. His respiration is 18 and oxygen saturation is 97%.   Wt Readings from Last 3 Encounters:  07/20/23 198 lb (89.8 kg)  04/27/23 203 lb (92.1 kg)  02/26/23 205 lb (93 kg)    Physical Exam Vitals reviewed.  HENT:     Head: Normocephalic and atraumatic.  Eyes:     Pupils: Pupils are equal, round, and reactive to light.  Cardiovascular:     Rate and Rhythm: Normal rate and regular rhythm.     Heart sounds: Normal heart sounds.  Pulmonary:     Effort: Pulmonary effort is normal.     Breath sounds: Normal breath sounds.  Abdominal:     General: Bowel sounds are normal.     Palpations: Abdomen is soft.  Musculoskeletal:        General: No tenderness or deformity. Normal range of motion.     Cervical back: Normal range of motion.  Lymphadenopathy:     Cervical: No cervical adenopathy.  Skin:    General: Skin is warm and dry.     Findings: No erythema or rash.  Neurological:     Mental Status: He is alert and oriented to person, place, and time.  Psychiatric:        Behavior: Behavior normal.        Thought Content: Thought content normal.        Judgment: Judgment normal.      Lab Results  Component Value Date   WBC 7.4 07/20/2023   HGB 14.9 07/20/2023   HCT 48.5 07/20/2023   MCV 84.9 07/20/2023   PLT 215 07/20/2023     Chemistry      Component Value Date/Time   NA 141 07/20/2023 1316   K 4.9 07/20/2023 1316   CL 100 07/20/2023 1316    CO2 33 (H) 07/20/2023 1316   BUN 20 07/20/2023 1316   CREATININE 1.77 (H) 07/20/2023 1316      Component Value Date/Time   CALCIUM 9.6 07/20/2023 1316   ALKPHOS 61 07/20/2023 1316   AST 13 (L) 07/20/2023 1316   ALT 8 07/20/2023 1316   BILITOT 1.1  07/20/2023 1316      Impression and Plan: Mr. Bustillos is a very nice 77 year old white male.  He has secondary polycythemia.  He is on testosterone injections which he needs.  Again, he is donating blood.  He is donating blood every 2 months.  I think this is perfect for him.  For right now, we will have him come back in 3 to 4 months.  We will get him through the Holiday season.    Josph Macho, MD 10/14/20242:03 PM

## 2023-07-21 DIAGNOSIS — D751 Secondary polycythemia: Secondary | ICD-10-CM | POA: Diagnosis not present

## 2023-07-21 DIAGNOSIS — N1832 Chronic kidney disease, stage 3b: Secondary | ICD-10-CM | POA: Diagnosis not present

## 2023-07-21 DIAGNOSIS — N2581 Secondary hyperparathyroidism of renal origin: Secondary | ICD-10-CM | POA: Diagnosis not present

## 2023-07-21 DIAGNOSIS — N209 Urinary calculus, unspecified: Secondary | ICD-10-CM | POA: Diagnosis not present

## 2023-07-21 DIAGNOSIS — M109 Gout, unspecified: Secondary | ICD-10-CM | POA: Diagnosis not present

## 2023-07-21 DIAGNOSIS — I129 Hypertensive chronic kidney disease with stage 1 through stage 4 chronic kidney disease, or unspecified chronic kidney disease: Secondary | ICD-10-CM | POA: Diagnosis not present

## 2023-07-21 LAB — IRON AND IRON BINDING CAPACITY (CC-WL,HP ONLY)
Iron: 70 ug/dL (ref 45–182)
Saturation Ratios: 17 % — ABNORMAL LOW (ref 17.9–39.5)
TIBC: 423 ug/dL (ref 250–450)
UIBC: 353 ug/dL (ref 117–376)

## 2023-07-27 ENCOUNTER — Telehealth: Payer: Self-pay

## 2023-07-27 NOTE — Telephone Encounter (Signed)
Received voicemail from patient stating he was tired and wondering if he can get IV iron.  This RN sent staff message to Dr. Myna Hidalgo to review pt labs and let staff know if patient can be scheduled for an iron infusion. This RN attempted to call patient back with update in plan of care with no answer after multiple rings.

## 2023-07-28 ENCOUNTER — Telehealth: Payer: Self-pay

## 2023-07-28 NOTE — Telephone Encounter (Signed)
Pt advised and states understanding. Message sent to schedulers to call pt and make appointment.

## 2023-07-28 NOTE — Telephone Encounter (Signed)
-----   Message from Nurse Tiffany H sent at 07/28/2023  4:29 PM EDT ----- Regarding: FW: Iron Needs a dose of monoferric ----- Message ----- From: Rozell Searing, RN Sent: 07/27/2023   2:49 PM EDT To: Margaretha Seeds, RN; Josph Macho, MD; # Subject: Iron                                           Dr. Myna Hidalgo,  This patient called and was wondering if he can get iron... he is feeling fatigued and asking for it. He last got monoferric in June 2024.  His labs on 07/20/2023 look like he is ok to get it (but I have no idea) and orders are in.  Is it ok to get him on the schedule?  Thanks, Dahlia Client, RN

## 2023-07-29 ENCOUNTER — Telehealth: Payer: Self-pay | Admitting: Hematology & Oncology

## 2023-07-29 ENCOUNTER — Other Ambulatory Visit: Payer: Self-pay | Admitting: Medical Oncology

## 2023-07-29 NOTE — Telephone Encounter (Signed)
Called pt for IV Iron scheduling. LVM.

## 2023-07-30 DIAGNOSIS — Z23 Encounter for immunization: Secondary | ICD-10-CM | POA: Diagnosis not present

## 2023-08-03 ENCOUNTER — Inpatient Hospital Stay: Payer: Medicare Other

## 2023-08-03 ENCOUNTER — Encounter: Payer: Self-pay | Admitting: Hematology & Oncology

## 2023-08-03 ENCOUNTER — Other Ambulatory Visit (HOSPITAL_BASED_OUTPATIENT_CLINIC_OR_DEPARTMENT_OTHER): Payer: Self-pay

## 2023-08-03 VITALS — BP 130/63 | HR 70 | Temp 98.0°F | Resp 18

## 2023-08-03 DIAGNOSIS — D751 Secondary polycythemia: Secondary | ICD-10-CM | POA: Diagnosis not present

## 2023-08-03 DIAGNOSIS — D500 Iron deficiency anemia secondary to blood loss (chronic): Secondary | ICD-10-CM | POA: Diagnosis not present

## 2023-08-03 MED ORDER — SODIUM CHLORIDE 0.9 % IV SOLN
1000.0000 mg | Freq: Once | INTRAVENOUS | Status: AC
  Administered 2023-08-03: 1000 mg via INTRAVENOUS
  Filled 2023-08-03: qty 10

## 2023-08-03 MED ORDER — SODIUM CHLORIDE 0.9 % IV SOLN: Freq: Once | INTRAVENOUS | Status: AC

## 2023-08-03 NOTE — Progress Notes (Signed)
Patient does not want to stay for the recommended 30 minute post IV iron observation. VSS. Patient tolerated well.

## 2023-08-03 NOTE — Patient Instructions (Signed)

## 2023-08-07 ENCOUNTER — Encounter (INDEPENDENT_AMBULATORY_CARE_PROVIDER_SITE_OTHER): Payer: Medicare Other | Admitting: Ophthalmology

## 2023-08-07 DIAGNOSIS — D3132 Benign neoplasm of left choroid: Secondary | ICD-10-CM

## 2023-08-07 DIAGNOSIS — H35033 Hypertensive retinopathy, bilateral: Secondary | ICD-10-CM | POA: Diagnosis not present

## 2023-08-07 DIAGNOSIS — H43813 Vitreous degeneration, bilateral: Secondary | ICD-10-CM

## 2023-08-07 DIAGNOSIS — I10 Essential (primary) hypertension: Secondary | ICD-10-CM

## 2023-08-07 DIAGNOSIS — H353112 Nonexudative age-related macular degeneration, right eye, intermediate dry stage: Secondary | ICD-10-CM | POA: Diagnosis not present

## 2023-08-07 DIAGNOSIS — H353221 Exudative age-related macular degeneration, left eye, with active choroidal neovascularization: Secondary | ICD-10-CM | POA: Diagnosis not present

## 2023-08-12 ENCOUNTER — Other Ambulatory Visit (HOSPITAL_BASED_OUTPATIENT_CLINIC_OR_DEPARTMENT_OTHER): Payer: Self-pay

## 2023-08-12 DIAGNOSIS — R300 Dysuria: Secondary | ICD-10-CM | POA: Diagnosis not present

## 2023-08-12 DIAGNOSIS — N419 Inflammatory disease of prostate, unspecified: Secondary | ICD-10-CM | POA: Diagnosis not present

## 2023-08-19 ENCOUNTER — Other Ambulatory Visit: Payer: Self-pay | Admitting: Allergy

## 2023-08-26 ENCOUNTER — Ambulatory Visit: Payer: Medicare Other | Admitting: Podiatry

## 2023-08-27 ENCOUNTER — Ambulatory Visit (INDEPENDENT_AMBULATORY_CARE_PROVIDER_SITE_OTHER): Payer: Medicare Other | Admitting: Podiatry

## 2023-08-27 DIAGNOSIS — M79674 Pain in right toe(s): Secondary | ICD-10-CM | POA: Diagnosis not present

## 2023-08-27 DIAGNOSIS — B351 Tinea unguium: Secondary | ICD-10-CM | POA: Diagnosis not present

## 2023-08-27 DIAGNOSIS — I739 Peripheral vascular disease, unspecified: Secondary | ICD-10-CM

## 2023-08-27 DIAGNOSIS — M79675 Pain in left toe(s): Secondary | ICD-10-CM | POA: Diagnosis not present

## 2023-08-27 NOTE — Progress Notes (Signed)
    Subjective:  Patient ID: [name], male    DOB: Jun 17, 1946,  MRN: [id number]  [name] presents to clinic today for:  Chief Complaint  Patient presents with   [facility]    RFC Left hallux Lateral side having pain    Patient notes nails are thick and elongated, causing pain in shoe gear when ambulating.  He wants his 1st and 2nd toenail corners cut back today.    PCP is Avva, Joylene Draft, MD.  Last seen 07/21/23  Past Medical History:  Diagnosis Date   Arthritis    Asthma    BPH (benign prostatic hyperplasia)    Complication of anesthesia    PROBLEMS VOIDING AFTER HERNIA REPAIR   GERD (gastroesophageal reflux disease)    Gout    Hyperlipidemia    Hypertension    IBS (irritable bowel syndrome)    Iron deficiency anemia due to chronic blood loss 03/27/2022   Macular degeneration    Pulmonary embolus (HCC) 09/06/12   HOSP AT [facility] AND PLACED ON COUMADIN   Rheumatoid arthritis (HCC)    Seasonal allergies    Urinary retention    HX OF KIDNEY STONES-BILATERAL, HX BPH  -- PT TOLD HIS KIDNEY FUNCTIONS  ELVATED WHILE HOSP DEC 2013 FOR PULMONARY EMBOLUS    Allergies  Allergen Reactions   Caffeine Other (See Comments)    Urinary retention and jittery   Codeine Other (See Comments)    Jittery   Iodinated Contrast Media Other (See Comments)    Hard on kidneys. The blue dye that went through my IV.    Pseudoephedrine Other (See Comments)    Urinary retention   Simvastatin Other (See Comments)    Hurt my bones   Penicillins Itching   Chlorpheniramine-Pseudoeph    Infliximab     Other reaction(s): ear infections   Objective:  [name] is a pleasant 77 y.o. male in NAD. AAO x 3.  Vascular Examination: Patient has palpable DP pulse, absent PT pulse bilateral.  Delayed capillary refill bilateral toes.  Sparse digital hair bilateral.  Proximal to distal cooling WNL bilateral.    Dermatological Examination: Interspaces are clear with no open lesions  noted bilateral.  Skin is shiny and atrophic bilateral.  Nails are 3-16mm thick, with yellowish/brown discoloration, subungual debris and distal onycholysis x10.  There is pain with compression of nails x10.   Patient qualifies for at-risk foot care because of PVD .  Assessment/Plan: 1. Pain due to onychomycosis of toenails of both feet   2. PVD (peripheral vascular disease) (HCC)    Mycotic nails x10 were sharply debrided with sterile nail nippers and power debriding burr to decrease bulk and length.  Hallux and 2nd toenail distal corners were cut back to alleviate discomfort  Return in about 9 weeks (around 10/29/2023) for RFC.   Clerance Lav, DPM, FACFAS [facility]     [address] [place], [place] [address]                Office [phone]  Fax [phone]

## 2023-09-07 DIAGNOSIS — L820 Inflamed seborrheic keratosis: Secondary | ICD-10-CM | POA: Diagnosis not present

## 2023-09-07 DIAGNOSIS — L570 Actinic keratosis: Secondary | ICD-10-CM | POA: Diagnosis not present

## 2023-09-07 DIAGNOSIS — L578 Other skin changes due to chronic exposure to nonionizing radiation: Secondary | ICD-10-CM | POA: Diagnosis not present

## 2023-09-07 DIAGNOSIS — L821 Other seborrheic keratosis: Secondary | ICD-10-CM | POA: Diagnosis not present

## 2023-09-08 DIAGNOSIS — M5030 Other cervical disc degeneration, unspecified cervical region: Secondary | ICD-10-CM | POA: Diagnosis not present

## 2023-09-08 DIAGNOSIS — M5459 Other low back pain: Secondary | ICD-10-CM | POA: Diagnosis not present

## 2023-09-08 DIAGNOSIS — M5416 Radiculopathy, lumbar region: Secondary | ICD-10-CM | POA: Diagnosis not present

## 2023-09-08 DIAGNOSIS — M51362 Other intervertebral disc degeneration, lumbar region with discogenic back pain and lower extremity pain: Secondary | ICD-10-CM | POA: Diagnosis not present

## 2023-09-08 DIAGNOSIS — Z79891 Long term (current) use of opiate analgesic: Secondary | ICD-10-CM | POA: Diagnosis not present

## 2023-09-08 DIAGNOSIS — M961 Postlaminectomy syndrome, not elsewhere classified: Secondary | ICD-10-CM | POA: Diagnosis not present

## 2023-09-14 DIAGNOSIS — N401 Enlarged prostate with lower urinary tract symptoms: Secondary | ICD-10-CM | POA: Diagnosis not present

## 2023-09-14 DIAGNOSIS — Z87442 Personal history of urinary calculi: Secondary | ICD-10-CM | POA: Diagnosis not present

## 2023-09-14 DIAGNOSIS — R350 Frequency of micturition: Secondary | ICD-10-CM | POA: Diagnosis not present

## 2023-09-14 DIAGNOSIS — E291 Testicular hypofunction: Secondary | ICD-10-CM | POA: Diagnosis not present

## 2023-09-15 DIAGNOSIS — M5416 Radiculopathy, lumbar region: Secondary | ICD-10-CM | POA: Diagnosis not present

## 2023-09-25 ENCOUNTER — Encounter (INDEPENDENT_AMBULATORY_CARE_PROVIDER_SITE_OTHER): Payer: Medicare Other | Admitting: Ophthalmology

## 2023-09-25 DIAGNOSIS — H43813 Vitreous degeneration, bilateral: Secondary | ICD-10-CM | POA: Diagnosis not present

## 2023-09-25 DIAGNOSIS — H353112 Nonexudative age-related macular degeneration, right eye, intermediate dry stage: Secondary | ICD-10-CM | POA: Diagnosis not present

## 2023-09-25 DIAGNOSIS — H353221 Exudative age-related macular degeneration, left eye, with active choroidal neovascularization: Secondary | ICD-10-CM | POA: Diagnosis not present

## 2023-09-25 DIAGNOSIS — H35033 Hypertensive retinopathy, bilateral: Secondary | ICD-10-CM

## 2023-09-25 DIAGNOSIS — I10 Essential (primary) hypertension: Secondary | ICD-10-CM

## 2023-10-21 ENCOUNTER — Ambulatory Visit: Payer: Medicare Other | Admitting: Podiatry

## 2023-10-27 ENCOUNTER — Ambulatory Visit (INDEPENDENT_AMBULATORY_CARE_PROVIDER_SITE_OTHER): Payer: Medicare Other | Admitting: Podiatry

## 2023-10-27 DIAGNOSIS — M79675 Pain in left toe(s): Secondary | ICD-10-CM | POA: Diagnosis not present

## 2023-10-27 DIAGNOSIS — M79674 Pain in right toe(s): Secondary | ICD-10-CM

## 2023-10-27 DIAGNOSIS — B351 Tinea unguium: Secondary | ICD-10-CM | POA: Diagnosis not present

## 2023-10-27 DIAGNOSIS — I739 Peripheral vascular disease, unspecified: Secondary | ICD-10-CM

## 2023-10-27 NOTE — Progress Notes (Unsigned)
    Subjective:  Patient ID: [name], male    DOB: 17-Aug-1946,  MRN: [id number]  [name] presents to clinic today for:  Chief Complaint  Patient presents with   RFC    RFC , not diabetic, no anticoags.  Left great has ingrown both borders, just wants it cut out.   Patient notes nails are thick and elongated, causing pain in shoe gear when ambulating.  He wants his 1st and 2nd toenail corners cut back today.    PCP is Avva, Gary Draft, MD.  Last seen 07/21/23  Past Medical History:  Diagnosis Date   Arthritis    Asthma    BPH (benign prostatic hyperplasia)    Complication of anesthesia    PROBLEMS VOIDING AFTER HERNIA REPAIR   GERD (gastroesophageal reflux disease)    Gout    Hyperlipidemia    Hypertension    IBS (irritable bowel syndrome)    Iron deficiency anemia due to chronic blood loss 03/27/2022   Macular degeneration    Pulmonary embolus (HCC) 09/06/12   HOSP AT [facility] AND PLACED ON COUMADIN   Rheumatoid arthritis (HCC)    Seasonal allergies    Urinary retention    HX OF KIDNEY STONES-BILATERAL, HX BPH  -- PT TOLD HIS KIDNEY FUNCTIONS  ELVATED WHILE HOSP DEC 2013 FOR PULMONARY EMBOLUS    Allergies  Allergen Reactions   Caffeine Other (See Comments)    Urinary retention and jittery   Codeine Other (See Comments)    Jittery   Iodinated Contrast Media Other (See Comments)    Hard on kidneys. The blue dye that went through my IV.    Pseudoephedrine Other (See Comments)    Urinary retention   Simvastatin Other (See Comments)    Hurt my bones   Penicillins Itching   Chlorpheniramine-Pseudoeph    Infliximab     Other reaction(s): ear infections   Objective:  [name] is a pleasant 78 y.o. male in NAD. AAO x 3.  Vascular Examination: Patient has palpable DP pulse, absent PT pulse bilateral.  Delayed capillary refill bilateral toes.  Sparse digital hair bilateral.  Proximal to distal cooling WNL bilateral.    Dermatological  Examination: Interspaces are clear with no open lesions noted bilateral.  Skin is shiny and atrophic bilateral.  Nails are 3-37mm thick, with yellowish/brown discoloration, subungual debris and distal onycholysis x10.  There is pain with compression of nails x10.   Patient qualifies for at-risk foot care because of PVD .  Assessment/Plan: 1. Pain due to onychomycosis of toenails of both feet   2. PVD (peripheral vascular disease) (HCC)    Mycotic nails x10 were sharply debrided with sterile nail nippers and power debriding burr to decrease bulk and length.  Hallux and 2nd toenail distal corners were cut back to alleviate discomfort  Return in about 3 months (around 01/25/2024) for Routine Foot Care.   Bronwen Betters, DPM, AACFAS [facility]     [address] [place], [place] [address]                Office [phone]  Fax [phone]

## 2023-10-29 ENCOUNTER — Ambulatory Visit: Payer: Medicare Other | Admitting: Podiatry

## 2023-10-30 ENCOUNTER — Telehealth: Payer: Self-pay

## 2023-10-30 NOTE — Telephone Encounter (Signed)
Attempted to call patient to let them know that due to Dr. Myna Hidalgo being on call patient will see Gary Stanford NP.  Unable to leave a voicemail as mailbox was full and patient didn't answer any other phone number.

## 2023-11-02 ENCOUNTER — Inpatient Hospital Stay (HOSPITAL_BASED_OUTPATIENT_CLINIC_OR_DEPARTMENT_OTHER): Payer: Medicare Other | Admitting: Family

## 2023-11-02 ENCOUNTER — Inpatient Hospital Stay: Payer: Medicare Other | Attending: Hematology & Oncology

## 2023-11-02 ENCOUNTER — Inpatient Hospital Stay: Payer: Medicare Other

## 2023-11-02 ENCOUNTER — Ambulatory Visit: Payer: Medicare Other | Admitting: Hematology & Oncology

## 2023-11-02 ENCOUNTER — Encounter: Payer: Self-pay | Admitting: Family

## 2023-11-02 VITALS — BP 120/67 | HR 80 | Temp 98.1°F | Resp 18 | Ht 70.0 in | Wt 205.0 lb

## 2023-11-02 DIAGNOSIS — D500 Iron deficiency anemia secondary to blood loss (chronic): Secondary | ICD-10-CM | POA: Diagnosis not present

## 2023-11-02 DIAGNOSIS — D751 Secondary polycythemia: Secondary | ICD-10-CM | POA: Insufficient documentation

## 2023-11-02 DIAGNOSIS — D508 Other iron deficiency anemias: Secondary | ICD-10-CM | POA: Diagnosis not present

## 2023-11-02 DIAGNOSIS — N1832 Chronic kidney disease, stage 3b: Secondary | ICD-10-CM

## 2023-11-02 LAB — CMP (CANCER CENTER ONLY)
ALT: 8 U/L (ref 0–44)
AST: 11 U/L — ABNORMAL LOW (ref 15–41)
Albumin: 4.2 g/dL (ref 3.5–5.0)
Alkaline Phosphatase: 69 U/L (ref 38–126)
Anion gap: 6 (ref 5–15)
BUN: 16 mg/dL (ref 8–23)
CO2: 34 mmol/L — ABNORMAL HIGH (ref 22–32)
Calcium: 9.6 mg/dL (ref 8.9–10.3)
Chloride: 101 mmol/L (ref 98–111)
Creatinine: 1.7 mg/dL — ABNORMAL HIGH (ref 0.61–1.24)
GFR, Estimated: 41 mL/min — ABNORMAL LOW (ref >60)
Glucose, Bld: 108 mg/dL — ABNORMAL HIGH (ref 70–99)
Potassium: 4.6 mmol/L (ref 3.5–5.1)
Sodium: 141 mmol/L (ref 135–145)
Total Bilirubin: 1 mg/dL (ref 0.0–1.2)
Total Protein: 6.2 g/dL — ABNORMAL LOW (ref 6.5–8.1)

## 2023-11-02 LAB — CBC WITH DIFFERENTIAL (CANCER CENTER ONLY)
Abs Immature Granulocytes: 0.02 10*3/uL (ref 0.00–0.07)
Basophils Absolute: 0.1 10*3/uL (ref 0.0–0.1)
Basophils Relative: 1 %
Eosinophils Absolute: 0.1 10*3/uL (ref 0.0–0.5)
Eosinophils Relative: 2 %
HCT: 50.1 % (ref 39.0–52.0)
Hemoglobin: 16.1 g/dL (ref 13.0–17.0)
Immature Granulocytes: 0 %
Lymphocytes Relative: 31 %
Lymphs Abs: 2.1 10*3/uL (ref 0.7–4.0)
MCH: 29.5 pg (ref 26.0–34.0)
MCHC: 32.1 g/dL (ref 30.0–36.0)
MCV: 91.9 fL (ref 80.0–100.0)
Monocytes Absolute: 0.7 10*3/uL (ref 0.1–1.0)
Monocytes Relative: 10 %
Neutro Abs: 3.8 10*3/uL (ref 1.7–7.7)
Neutrophils Relative %: 56 %
Platelet Count: 192 10*3/uL (ref 150–400)
RBC: 5.45 MIL/uL (ref 4.22–5.81)
RDW: 15.5 % (ref 11.5–15.5)
WBC Count: 6.7 10*3/uL (ref 4.0–10.5)
nRBC: 0 % (ref 0.0–0.2)

## 2023-11-02 LAB — IRON AND IRON BINDING CAPACITY (CC-WL,HP ONLY)
Iron: 49 ug/dL (ref 45–182)
Saturation Ratios: 13 % — ABNORMAL LOW (ref 17.9–39.5)
TIBC: 375 ug/dL (ref 250–450)
UIBC: 326 ug/dL (ref 117–376)

## 2023-11-02 LAB — URIC ACID: Uric Acid, Serum: 4.7 mg/dL (ref 3.7–8.6)

## 2023-11-02 LAB — FERRITIN: Ferritin: 11 ng/mL — ABNORMAL LOW (ref 24–336)

## 2023-11-02 NOTE — Progress Notes (Signed)
Hematology and Oncology Follow Up Visit  [name] [id number] 1946/05/29 78 y.o. 11/02/2023   Principle Diagnosis:  Secondary polycythemia due to testosterone injections Iron deficiency anemia secondary to blood donations   Current Therapy:        Patient donating blood every 4 months. IV iron EC ASA 81 mg po q day   Interim History:  [name] is here today for follow-up. He is doing well and has no complaints at this time. He notes fatigue sometimes. Iron studies are pending.  He is still doing his testosterone injection.  He donated last week and Hgb today is 16.1, MCV 91, platelets 192 and WBC count 6.7.  No abnormal bruising, no petechiae.  No fever, chills, n/v, cough, rash, dizziness, SOB, chest pain, palpitations, abdominal pain or changes in bowel or bladder habits at this time.  No swelling, numbness or tingling in his extremities. He has generalized aches and pains in his joints which he attributes to arthritis.  No falls or syncope reported.  Appetite and hydration are good. Weight is stable at 205 lbs.   ECOG Performance Status: 1 - Symptomatic but completely ambulatory  Medications:  Allergies as of 11/02/2023       Reactions   Caffeine Other (See Comments)   Urinary retention and jittery   Codeine Other (See Comments)   Jittery   Iodinated Contrast Media Other (See Comments)   Hard on kidneys. The blue dye that went through my IV.    Pseudoephedrine Other (See Comments)   Urinary retention   Simvastatin Other (See Comments)   Hurt my bones   Penicillins Itching   Chlorpheniramine-pseudoeph    Infliximab    Other reaction(s): ear infections        Medication List        Accurate as of November 02, 2023 12:34 PM. If you have any questions, ask your nurse or doctor.          STOP taking these medications    doxazosin 2 MG tablet Commonly known as: CARDURA Stopped by: Eileen Stanford       TAKE these medications    albuterol (2.5  MG/3ML) 0.083% nebulizer solution Commonly known as: PROVENTIL Take 3 mLs (2.5 mg total) by nebulization every 4 (four) hours as needed for wheezing or shortness of breath.   albuterol 108 (90 Base) MCG/ACT inhaler Commonly known as: VENTOLIN HFA INHALE 2 PUFFS EVERY 4 TO 6 HOURS IF NEEDED FOR COUGH OR WHEEZE   allopurinol 100 MG tablet Commonly known as: ZYLOPRIM 2 tablet Orally Once a day   amLODipine 2.5 MG tablet Commonly known as: NORVASC Take 1 tablet by mouth 2 (two) times daily.   Azelastine HCl 137 MCG/SPRAY Soln Place 1 spray into the nose 2 (two) times daily. Use 2 sprays in each nostril 1-2 times daily   Besivance 0.6 % Susp Generic drug: Besifloxacin HCl   Cialis 20 MG tablet Generic drug: tadalafil 1 tablet   cromolyn 4 % ophthalmic solution Commonly known as: OPTICROM 1 drop 4 (four) times daily.   dicyclomine 10 MG capsule Commonly known as: BENTYL take 1 capsule by mouth every 6 hours if needed for BLOATING   fluticasone 50 MCG/ACT nasal spray Commonly known as: FLONASE Place 2 sprays into both nostrils daily as needed (use for 1-2 weeks at a time.).   furosemide 40 MG tablet Commonly known as: LASIX Take 40 mg by mouth daily.   HYDROcodone-acetaminophen 10-325 MG tablet Commonly known as: NORCO  Take by mouth.   Klor-Con M10 10 MEQ tablet Generic drug: potassium chloride 1 tablet with food Orally Twice a day   levocetirizine 5 MG tablet Commonly known as: XYZAL   omeprazole 20 MG capsule Commonly known as: PRILOSEC 1 capsule   oxybutynin 5 MG tablet Commonly known as: DITROPAN Take by mouth.   testosterone cypionate 100 MG/ML injection Commonly known as: DEPOTESTOTERONE CYPIONATE 0.58ml   triamcinolone cream 0.5 % Commonly known as: KENALOG   trimethoprim-polymyxin b ophthalmic solution Commonly known as: POLYTRIM   VITAMIN D3 PO Take by mouth daily.        Allergies:  Allergies  Allergen Reactions   Caffeine Other (See  Comments)    Urinary retention and jittery   Codeine Other (See Comments)    Jittery   Iodinated Contrast Media Other (See Comments)    Hard on kidneys. The blue dye that went through my IV.    Pseudoephedrine Other (See Comments)    Urinary retention   Simvastatin Other (See Comments)    Hurt my bones   Penicillins Itching   Chlorpheniramine-Pseudoeph    Infliximab     Other reaction(s): ear infections    Past Medical History, Surgical history, Social history, and Family History were reviewed and updated.  Review of Systems: All other 10 point review of systems is negative.   Physical Exam:  vitals were not taken for this visit.   Wt Readings from Last 3 Encounters:  07/20/23 198 lb (89.8 kg)  04/27/23 203 lb (92.1 kg)  02/26/23 205 lb (93 kg)    Ocular: Sclerae unicteric, pupils equal, round and reactive to light Ear-nose-throat: Oropharynx clear, dentition fair Lymphatic: No cervical or supraclavicular adenopathy Lungs no rales or rhonchi, good excursion bilaterally Heart regular rate and rhythm, no murmur appreciated Abd soft, nontender, positive bowel sounds MSK no focal spinal tenderness, no joint edema Neuro: non-focal, well-oriented, appropriate affect Breasts: Deferred   Lab Results  Component Value Date   WBC 6.7 11/02/2023   HGB 16.1 11/02/2023   HCT 50.1 11/02/2023   MCV 91.9 11/02/2023   PLT 192 11/02/2023   Lab Results  Component Value Date   FERRITIN 8 (L) 07/20/2023   IRON 70 07/20/2023   TIBC 423 07/20/2023   UIBC 353 07/20/2023   IRONPCTSAT 17 (L) 07/20/2023   Lab Results  Component Value Date   RETICCTPCT 1.1 07/20/2023   RBC 5.45 11/02/2023   No results found for: "KPAFRELGTCHN", "LAMBDASER", "KAPLAMBRATIO" No results found for: "IGGSERUM", "IGA", "IGMSERUM" No results found for: "TOTALPROTELP", "ALBUMINELP", "A1GS", "A2GS", "BETS", "BETA2SER", "GAMS", "MSPIKE", "SPEI"   Chemistry      Component Value Date/Time   NA 141 07/20/2023  1316   K 4.9 07/20/2023 1316   CL 100 07/20/2023 1316   CO2 33 (H) 07/20/2023 1316   BUN 20 07/20/2023 1316   CREATININE 1.77 (H) 07/20/2023 1316      Component Value Date/Time   CALCIUM 9.6 07/20/2023 1316   ALKPHOS 61 07/20/2023 1316   AST 13 (L) 07/20/2023 1316   ALT 8 07/20/2023 1316   BILITOT 1.1 07/20/2023 1316       Impression and Plan: [name] is a pleasant 78 yo caucasian gentleman with secondary polycythemia with testosterone injections as well as IDA secondary to regular phlebotomy.  Iron studies are pending.  Hgb is stable at 16.1 with last weeks blood donation.  Follow-up in 3 months.   Eileen Stanford, NP 1/27/202512:34 PM

## 2023-11-03 ENCOUNTER — Telehealth: Payer: Self-pay | Admitting: *Deleted

## 2023-11-03 NOTE — Telephone Encounter (Signed)
Called patient to let him know that his iron is low per Dr Myna Hidalgo and will need a dose of iron.  Unable to leave message on cell phone.  Left message on patients home voicemail to call us back to schedule.  Scheduling message sent.Per [name], patient can get Monoferric.

## 2023-11-06 ENCOUNTER — Inpatient Hospital Stay: Payer: Medicare Other

## 2023-11-06 VITALS — BP 133/64 | HR 66 | Temp 98.3°F | Resp 17

## 2023-11-06 DIAGNOSIS — D751 Secondary polycythemia: Secondary | ICD-10-CM | POA: Diagnosis not present

## 2023-11-06 DIAGNOSIS — D508 Other iron deficiency anemias: Secondary | ICD-10-CM | POA: Diagnosis not present

## 2023-11-06 DIAGNOSIS — D500 Iron deficiency anemia secondary to blood loss (chronic): Secondary | ICD-10-CM

## 2023-11-06 MED ORDER — SODIUM CHLORIDE 0.9 % IV SOLN: INTRAVENOUS | Status: DC

## 2023-11-06 MED ORDER — FERRIC DERISOMALTOSE(ONE DOSE) 1000 MG/10ML IV SOLN
1000.0000 mg | Freq: Once | INTRAVENOUS | Status: AC
  Administered 2023-11-06: 1000 mg via INTRAVENOUS
  Filled 2023-11-06: qty 10

## 2023-11-06 NOTE — Patient Instructions (Signed)

## 2023-11-06 NOTE — Progress Notes (Signed)
Pt declined to stay for post infusion observation period. Pt stated he has tolerated medication multiple times prior without difficulty. Pt aware to call clinic with any questions or concerns. Pt verbalized understanding and had no further questions.   

## 2023-11-13 ENCOUNTER — Encounter (INDEPENDENT_AMBULATORY_CARE_PROVIDER_SITE_OTHER): Payer: Medicare Other | Admitting: Ophthalmology

## 2023-11-13 DIAGNOSIS — H353221 Exudative age-related macular degeneration, left eye, with active choroidal neovascularization: Secondary | ICD-10-CM | POA: Diagnosis not present

## 2023-11-13 DIAGNOSIS — H43813 Vitreous degeneration, bilateral: Secondary | ICD-10-CM

## 2023-11-13 DIAGNOSIS — D3132 Benign neoplasm of left choroid: Secondary | ICD-10-CM

## 2023-11-13 DIAGNOSIS — H35033 Hypertensive retinopathy, bilateral: Secondary | ICD-10-CM | POA: Diagnosis not present

## 2023-11-13 DIAGNOSIS — H353112 Nonexudative age-related macular degeneration, right eye, intermediate dry stage: Secondary | ICD-10-CM | POA: Diagnosis not present

## 2023-11-13 DIAGNOSIS — I10 Essential (primary) hypertension: Secondary | ICD-10-CM

## 2023-12-03 DIAGNOSIS — L600 Ingrowing nail: Secondary | ICD-10-CM | POA: Diagnosis not present

## 2023-12-10 DIAGNOSIS — R948 Abnormal results of function studies of other organs and systems: Secondary | ICD-10-CM | POA: Diagnosis not present

## 2023-12-10 DIAGNOSIS — E291 Testicular hypofunction: Secondary | ICD-10-CM | POA: Diagnosis not present

## 2023-12-10 DIAGNOSIS — M1990 Unspecified osteoarthritis, unspecified site: Secondary | ICD-10-CM | POA: Diagnosis not present

## 2023-12-10 DIAGNOSIS — R350 Frequency of micturition: Secondary | ICD-10-CM | POA: Diagnosis not present

## 2023-12-10 DIAGNOSIS — N401 Enlarged prostate with lower urinary tract symptoms: Secondary | ICD-10-CM | POA: Diagnosis not present

## 2023-12-10 DIAGNOSIS — J069 Acute upper respiratory infection, unspecified: Secondary | ICD-10-CM | POA: Diagnosis not present

## 2023-12-10 DIAGNOSIS — Z87442 Personal history of urinary calculi: Secondary | ICD-10-CM | POA: Diagnosis not present

## 2023-12-10 DIAGNOSIS — L600 Ingrowing nail: Secondary | ICD-10-CM | POA: Diagnosis not present

## 2023-12-10 DIAGNOSIS — M069 Rheumatoid arthritis, unspecified: Secondary | ICD-10-CM | POA: Diagnosis not present

## 2023-12-16 DIAGNOSIS — H4322 Crystalline deposits in vitreous body, left eye: Secondary | ICD-10-CM | POA: Diagnosis not present

## 2023-12-16 DIAGNOSIS — H353111 Nonexudative age-related macular degeneration, right eye, early dry stage: Secondary | ICD-10-CM | POA: Diagnosis not present

## 2023-12-16 DIAGNOSIS — H353221 Exudative age-related macular degeneration, left eye, with active choroidal neovascularization: Secondary | ICD-10-CM | POA: Diagnosis not present

## 2023-12-28 ENCOUNTER — Other Ambulatory Visit: Payer: Self-pay | Admitting: *Deleted

## 2023-12-28 MED ORDER — FLUTICASONE PROPIONATE 50 MCG/ACT NA SUSP: 2.0000 | Freq: Every day | NASAL | 5 refills | Status: DC | PRN

## 2023-12-28 MED ORDER — AZELASTINE HCL 137 MCG/SPRAY NA SOLN: 1.0000 | Freq: Two times a day (BID) | NASAL | 5 refills | Status: AC

## 2023-12-31 DIAGNOSIS — H353221 Exudative age-related macular degeneration, left eye, with active choroidal neovascularization: Secondary | ICD-10-CM | POA: Diagnosis not present

## 2024-01-01 ENCOUNTER — Encounter (INDEPENDENT_AMBULATORY_CARE_PROVIDER_SITE_OTHER): Payer: Medicare Other | Admitting: Ophthalmology

## 2024-01-07 DIAGNOSIS — M791 Myalgia, unspecified site: Secondary | ICD-10-CM | POA: Diagnosis not present

## 2024-01-07 DIAGNOSIS — Z79891 Long term (current) use of opiate analgesic: Secondary | ICD-10-CM | POA: Diagnosis not present

## 2024-01-07 DIAGNOSIS — N1832 Chronic kidney disease, stage 3b: Secondary | ICD-10-CM | POA: Diagnosis not present

## 2024-01-14 ENCOUNTER — Inpatient Hospital Stay

## 2024-01-14 ENCOUNTER — Inpatient Hospital Stay: Attending: Hematology & Oncology

## 2024-01-14 ENCOUNTER — Telehealth: Payer: Self-pay

## 2024-01-14 ENCOUNTER — Other Ambulatory Visit: Payer: Self-pay

## 2024-01-14 DIAGNOSIS — D508 Other iron deficiency anemias: Secondary | ICD-10-CM | POA: Insufficient documentation

## 2024-01-14 DIAGNOSIS — D751 Secondary polycythemia: Secondary | ICD-10-CM | POA: Insufficient documentation

## 2024-01-14 DIAGNOSIS — D500 Iron deficiency anemia secondary to blood loss (chronic): Secondary | ICD-10-CM

## 2024-01-14 LAB — CBC WITH DIFFERENTIAL (CANCER CENTER ONLY)
Abs Immature Granulocytes: 0.12 10*3/uL — ABNORMAL HIGH (ref 0.00–0.07)
Basophils Absolute: 0.1 10*3/uL (ref 0.0–0.1)
Basophils Relative: 0 %
Eosinophils Absolute: 0 10*3/uL (ref 0.0–0.5)
Eosinophils Relative: 0 %
HCT: 54.7 % — ABNORMAL HIGH (ref 39.0–52.0)
Hemoglobin: 17.9 g/dL — ABNORMAL HIGH (ref 13.0–17.0)
Immature Granulocytes: 1 %
Lymphocytes Relative: 13 %
Lymphs Abs: 1.5 10*3/uL (ref 0.7–4.0)
MCH: 30.4 pg (ref 26.0–34.0)
MCHC: 32.7 g/dL (ref 30.0–36.0)
MCV: 92.9 fL (ref 80.0–100.0)
Monocytes Absolute: 1.1 10*3/uL — ABNORMAL HIGH (ref 0.1–1.0)
Monocytes Relative: 9 %
Neutro Abs: 9.2 10*3/uL — ABNORMAL HIGH (ref 1.7–7.7)
Neutrophils Relative %: 77 %
Platelet Count: 228 10*3/uL (ref 150–400)
RBC: 5.89 MIL/uL — ABNORMAL HIGH (ref 4.22–5.81)
RDW: 16.1 % — ABNORMAL HIGH (ref 11.5–15.5)
WBC Count: 12 10*3/uL — ABNORMAL HIGH (ref 4.0–10.5)
nRBC: 0 % (ref 0.0–0.2)

## 2024-01-14 NOTE — Progress Notes (Signed)
 Reviewed pt labs with Dr. Myna Hidalgo and based on labs and pt symptoms of "my head just doesn't feel right and I feel off" Phlebotomy performed. Pt scheduled for labs/phlebotomy for one week and then a phlebotomy appointment added to already scheduled appointments on 02/01/2024. This RN reviewed pt labs and appointments. Pt very appreciative of help and had no further questions.

## 2024-01-14 NOTE — Telephone Encounter (Signed)
 Pt left voicemail and called the front office multiple times this AM. This RN called patient and while discussing patient symptoms including "my head doesn't feel right and I think my blood is too thick"  pt stated "My pcp is calling back" and hung up on this RN. Pt was instructed to call the office back. No return phone call was received from patient. This RN reached back out to patient to follow up and pt stated his PCP said to have his labs checked. Pt kept repeating himself. Pt states "my head just doesn't feel right" Pt states his BP is fine. Pt states he is able to come in for labs and possible phlebotomy. Pt instructed that if he starts to feel any different or his head is worse to seek emergency care. Pt verbalized understanding and had no further questions.

## 2024-01-14 NOTE — Patient Instructions (Signed)

## 2024-01-14 NOTE — Progress Notes (Signed)
 [name] presents today for phlebotomy per MD orders. Phlebotomy1 procedure started at 1445 and ended at1500 520 grams removed. Patient observed for 30 minutes after procedure without any incident. Patient tolerated procedure well. IV needle removed intact. Pt tolerated procedure well. Declines to stay post Phle observation period. VSS, drank soda and ate crackers. Steady gait noted.

## 2024-01-15 ENCOUNTER — Other Ambulatory Visit: Payer: Self-pay | Admitting: Allergy

## 2024-01-18 ENCOUNTER — Ambulatory Visit: Payer: Medicare Other | Admitting: Podiatry

## 2024-01-20 ENCOUNTER — Other Ambulatory Visit: Payer: Self-pay | Admitting: Family

## 2024-01-20 DIAGNOSIS — D751 Secondary polycythemia: Secondary | ICD-10-CM

## 2024-01-20 DIAGNOSIS — D500 Iron deficiency anemia secondary to blood loss (chronic): Secondary | ICD-10-CM

## 2024-01-21 ENCOUNTER — Inpatient Hospital Stay

## 2024-01-21 DIAGNOSIS — Z7409 Other reduced mobility: Secondary | ICD-10-CM | POA: Diagnosis not present

## 2024-01-21 DIAGNOSIS — D751 Secondary polycythemia: Secondary | ICD-10-CM

## 2024-01-21 DIAGNOSIS — L602 Onychogryphosis: Secondary | ICD-10-CM | POA: Diagnosis not present

## 2024-01-21 DIAGNOSIS — D45 Polycythemia vera: Secondary | ICD-10-CM

## 2024-01-21 DIAGNOSIS — D508 Other iron deficiency anemias: Secondary | ICD-10-CM | POA: Diagnosis not present

## 2024-01-21 DIAGNOSIS — L603 Nail dystrophy: Secondary | ICD-10-CM | POA: Diagnosis not present

## 2024-01-21 DIAGNOSIS — Z789 Other specified health status: Secondary | ICD-10-CM | POA: Diagnosis not present

## 2024-01-21 DIAGNOSIS — D500 Iron deficiency anemia secondary to blood loss (chronic): Secondary | ICD-10-CM

## 2024-01-21 LAB — CBC WITH DIFFERENTIAL (CANCER CENTER ONLY)
Abs Immature Granulocytes: 0.1 10*3/uL — ABNORMAL HIGH (ref 0.00–0.07)
Basophils Absolute: 0 10*3/uL (ref 0.0–0.1)
Basophils Relative: 1 %
Eosinophils Absolute: 0 10*3/uL (ref 0.0–0.5)
Eosinophils Relative: 0 %
HCT: 51.9 % (ref 39.0–52.0)
Hemoglobin: 16.8 g/dL (ref 13.0–17.0)
Immature Granulocytes: 1 %
Lymphocytes Relative: 24 %
Lymphs Abs: 2 10*3/uL (ref 0.7–4.0)
MCH: 30 pg (ref 26.0–34.0)
MCHC: 32.4 g/dL (ref 30.0–36.0)
MCV: 92.7 fL (ref 80.0–100.0)
Monocytes Absolute: 0.8 10*3/uL (ref 0.1–1.0)
Monocytes Relative: 10 %
Neutro Abs: 5.6 10*3/uL (ref 1.7–7.7)
Neutrophils Relative %: 64 %
Platelet Count: 199 10*3/uL (ref 150–400)
RBC: 5.6 MIL/uL (ref 4.22–5.81)
RDW: 15.9 % — ABNORMAL HIGH (ref 11.5–15.5)
WBC Count: 8.6 10*3/uL (ref 4.0–10.5)
nRBC: 0 % (ref 0.0–0.2)

## 2024-01-21 LAB — FERRITIN: Ferritin: 19 ng/mL — ABNORMAL LOW (ref 24–336)

## 2024-01-21 LAB — CMP (CANCER CENTER ONLY)
ALT: 10 U/L (ref 0–44)
AST: 11 U/L — ABNORMAL LOW (ref 15–41)
Albumin: 4.1 g/dL (ref 3.5–5.0)
Alkaline Phosphatase: 74 U/L (ref 38–126)
Anion gap: 10 (ref 5–15)
BUN: 22 mg/dL (ref 8–23)
CO2: 29 mmol/L (ref 22–32)
Calcium: 9.4 mg/dL (ref 8.9–10.3)
Chloride: 102 mmol/L (ref 98–111)
Creatinine: 1.61 mg/dL — ABNORMAL HIGH (ref 0.61–1.24)
GFR, Estimated: 43 mL/min — ABNORMAL LOW (ref >60)
Glucose, Bld: 116 mg/dL — ABNORMAL HIGH (ref 70–99)
Potassium: 4.3 mmol/L (ref 3.5–5.1)
Sodium: 141 mmol/L (ref 135–145)
Total Bilirubin: 1.5 mg/dL — ABNORMAL HIGH (ref 0.0–1.2)
Total Protein: 6.1 g/dL — ABNORMAL LOW (ref 6.5–8.1)

## 2024-01-21 LAB — IRON AND IRON BINDING CAPACITY (CC-WL,HP ONLY)
Iron: 96 ug/dL (ref 45–182)
Saturation Ratios: 29 % (ref 17.9–39.5)
TIBC: 335 ug/dL (ref 250–450)
UIBC: 239 ug/dL (ref 117–376)

## 2024-01-21 NOTE — Progress Notes (Signed)
 One unit phlebotomy performed today with end bag weight being 525 gms.  IV site right AC was unremarkable and patient tolerated well and nourishments given.

## 2024-01-27 DIAGNOSIS — I129 Hypertensive chronic kidney disease with stage 1 through stage 4 chronic kidney disease, or unspecified chronic kidney disease: Secondary | ICD-10-CM | POA: Diagnosis not present

## 2024-01-27 DIAGNOSIS — D751 Secondary polycythemia: Secondary | ICD-10-CM | POA: Diagnosis not present

## 2024-01-27 DIAGNOSIS — N1832 Chronic kidney disease, stage 3b: Secondary | ICD-10-CM | POA: Diagnosis not present

## 2024-01-29 ENCOUNTER — Encounter: Payer: Self-pay | Admitting: Hematology & Oncology

## 2024-02-01 ENCOUNTER — Encounter: Payer: Self-pay | Admitting: Hematology & Oncology

## 2024-02-01 ENCOUNTER — Inpatient Hospital Stay (HOSPITAL_BASED_OUTPATIENT_CLINIC_OR_DEPARTMENT_OTHER): Payer: Medicare Other | Admitting: Family

## 2024-02-01 ENCOUNTER — Inpatient Hospital Stay: Payer: Medicare Other

## 2024-02-01 ENCOUNTER — Inpatient Hospital Stay

## 2024-02-01 VITALS — BP 117/56 | HR 80 | Temp 98.5°F | Resp 20 | Ht 65.0 in | Wt 195.0 lb

## 2024-02-01 DIAGNOSIS — D751 Secondary polycythemia: Secondary | ICD-10-CM

## 2024-02-01 DIAGNOSIS — D508 Other iron deficiency anemias: Secondary | ICD-10-CM | POA: Diagnosis not present

## 2024-02-01 DIAGNOSIS — D500 Iron deficiency anemia secondary to blood loss (chronic): Secondary | ICD-10-CM

## 2024-02-01 LAB — CMP (CANCER CENTER ONLY)
ALT: 11 U/L (ref 0–44)
AST: 10 U/L — ABNORMAL LOW (ref 15–41)
Albumin: 4.1 g/dL (ref 3.5–5.0)
Alkaline Phosphatase: 69 U/L (ref 38–126)
Anion gap: 9 (ref 5–15)
BUN: 29 mg/dL — ABNORMAL HIGH (ref 8–23)
CO2: 30 mmol/L (ref 22–32)
Calcium: 9.4 mg/dL (ref 8.9–10.3)
Chloride: 101 mmol/L (ref 98–111)
Creatinine: 1.82 mg/dL — ABNORMAL HIGH (ref 0.61–1.24)
GFR, Estimated: 37 mL/min — ABNORMAL LOW (ref >60)
Glucose, Bld: 90 mg/dL (ref 70–99)
Potassium: 4.7 mmol/L (ref 3.5–5.1)
Sodium: 140 mmol/L (ref 135–145)
Total Bilirubin: 0.9 mg/dL (ref 0.0–1.2)
Total Protein: 6.3 g/dL — ABNORMAL LOW (ref 6.5–8.1)

## 2024-02-01 LAB — CBC WITH DIFFERENTIAL (CANCER CENTER ONLY)
Abs Immature Granulocytes: 0.11 10*3/uL — ABNORMAL HIGH (ref 0.00–0.07)
Basophils Absolute: 0 10*3/uL (ref 0.0–0.1)
Basophils Relative: 0 %
Eosinophils Absolute: 0.1 10*3/uL (ref 0.0–0.5)
Eosinophils Relative: 1 %
HCT: 46.6 % (ref 39.0–52.0)
Hemoglobin: 15.8 g/dL (ref 13.0–17.0)
Immature Granulocytes: 1 %
Lymphocytes Relative: 25 %
Lymphs Abs: 1.9 10*3/uL (ref 0.7–4.0)
MCH: 31.1 pg (ref 26.0–34.0)
MCHC: 33.9 g/dL (ref 30.0–36.0)
MCV: 91.7 fL (ref 80.0–100.0)
Monocytes Absolute: 0.9 10*3/uL (ref 0.1–1.0)
Monocytes Relative: 11 %
Neutro Abs: 4.9 10*3/uL (ref 1.7–7.7)
Neutrophils Relative %: 62 %
Platelet Count: 228 10*3/uL (ref 150–400)
RBC: 5.08 MIL/uL (ref 4.22–5.81)
RDW: 14.8 % (ref 11.5–15.5)
WBC Count: 7.9 10*3/uL (ref 4.0–10.5)
nRBC: 0 % (ref 0.0–0.2)

## 2024-02-01 LAB — IRON AND IRON BINDING CAPACITY (CC-WL,HP ONLY)
Iron: 39 ug/dL — ABNORMAL LOW (ref 45–182)
Saturation Ratios: 11 % — ABNORMAL LOW (ref 17.9–39.5)
TIBC: 358 ug/dL (ref 250–450)
UIBC: 319 ug/dL (ref 117–376)

## 2024-02-01 LAB — FERRITIN: Ferritin: 24 ng/mL (ref 24–336)

## 2024-02-01 NOTE — Progress Notes (Signed)
 Hematology and Oncology Follow Up Visit  [name] [id number] Apr 03, 1946 78 y.o. 02/01/2024   Principle Diagnosis:  Secondary polycythemia due to testosterone  injections Iron deficiency anemia secondary to blood donations   Current Therapy:        Patient donating blood every 4 months. IV iron EC ASA 81 mg po q day   Interim History:  [name] is here today for follow-up. He is doing well and has no complaints at this time.  He went and donated with [facility] last week on Thursday.  No fatigue.  He has not noted any fever, chills, n/v, cough, rash, dizziness, SOB, chest pain, palpitations, abdominal pain or changes in bowel or bladder habits.  He has generalized aches and pains all over with arthritis.  No swelling or tingling in his extremities.  No falls or syncope.  Appetite and hydration are good. Weight is stable at 195 lbs.   ECOG Performance Status: 1 - Symptomatic but completely ambulatory  Medications:  Allergies as of 02/01/2024       Reactions   Caffeine Other (See Comments)   Urinary retention and jittery   Codeine Other (See Comments)   Jittery   Iodinated Contrast Media Other (See Comments)   Hard on kidneys. The blue dye that went through my IV.    Pseudoephedrine Other (See Comments)   Urinary retention   Simvastatin Other (See Comments)   Hurt my bones   Penicillins Itching   Chlorpheniramine-pseudoeph    Infliximab     Other reaction(s): ear infections        Medication List        Accurate as of February 01, 2024  1:03 PM. If you have any questions, ask your nurse or doctor.          albuterol  (2.5 MG/3ML) 0.083% nebulizer solution Commonly known as: PROVENTIL  Take 3 mLs (2.5 mg total) by nebulization every 4 (four) hours as needed for wheezing or shortness of breath.   albuterol  108 (90 Base) MCG/ACT inhaler Commonly known as: VENTOLIN  HFA INHALE 2 PUFFS EVERY 4 TO 6 HOURS IF NEEDED FOR COUGH OR WHEEZE   alfuzosin 10 MG 24 hr  tablet Commonly known as: UROXATRAL Take 10 mg by mouth daily with breakfast.   allopurinol  100 MG tablet Commonly known as: ZYLOPRIM  2 tablet Orally Once a day   amLODipine 2.5 MG tablet Commonly known as: NORVASC Take 1 tablet by mouth 2 (two) times daily.   Azelastine  HCl 137 MCG/SPRAY Soln Place 1 spray into the nose 2 (two) times daily. Use 2 sprays in each nostril 1-2 times daily   Besivance 0.6 % Susp Generic drug: Besifloxacin HCl   Cialis 20 MG tablet Generic drug: tadalafil 1 tablet   cromolyn 4 % ophthalmic solution Commonly known as: OPTICROM 1 drop 4 (four) times daily.   dicyclomine  10 MG capsule Commonly known as: BENTYL  take 1 capsule by mouth every 6 hours if needed for BLOATING   fluticasone  50 MCG/ACT nasal spray Commonly known as: FLONASE  Place 2 sprays into both nostrils daily as needed (use for 1-2 weeks at a time.).   furosemide 40 MG tablet Commonly known as: LASIX Take 40 mg by mouth daily.   HYDROcodone -acetaminophen  10-325 MG tablet Commonly known as: NORCO Take by mouth.   Klor-Con  M10 10 MEQ tablet Generic drug: potassium chloride  1 tablet with food Orally Twice a day   levocetirizine 5 MG tablet Commonly known as: XYZAL    omeprazole  20 MG capsule Commonly  known as: PRILOSEC 1 capsule   oxybutynin 5 MG tablet Commonly known as: DITROPAN Take by mouth.   testosterone  cypionate 100 MG/ML injection Commonly known as: DEPOTESTOTERONE CYPIONATE 0.5ml   triamcinolone  cream 0.5 % Commonly known as: KENALOG    trimethoprim-polymyxin b ophthalmic solution Commonly known as: POLYTRIM   VITAMIN D3 PO Take by mouth daily.        Allergies:  Allergies  Allergen Reactions   Caffeine Other (See Comments)    Urinary retention and jittery   Codeine Other (See Comments)    Jittery   Iodinated Contrast Media Other (See Comments)    Hard on kidneys. The blue dye that went through my IV.    Pseudoephedrine Other (See Comments)     Urinary retention   Simvastatin Other (See Comments)    Hurt my bones   Penicillins Itching   Chlorpheniramine-Pseudoeph    Infliximab      Other reaction(s): ear infections    Past Medical History, Surgical history, Social history, and Family History were reviewed and updated.  Review of Systems: All other 10 point review of systems is negative.   Physical Exam:  height is 5\' 5"  (1.651 m) and weight is 195 lb 0.6 oz (88.5 kg). His oral temperature is 98.5 F (36.9 C). His blood pressure is 117/56 (abnormal) and his pulse is 80. His respiration is 20 and oxygen  saturation is 98%.   Wt Readings from Last 3 Encounters:  02/01/24 195 lb 0.6 oz (88.5 kg)  11/02/23 205 lb (93 kg)  07/20/23 198 lb (89.8 kg)    Ocular: Sclerae unicteric, pupils equal, round and reactive to light Ear-nose-throat: Oropharynx clear, dentition fair Lymphatic: No cervical or supraclavicular adenopathy Lungs no rales or rhonchi, good excursion bilaterally Heart regular rate and rhythm, no murmur appreciated Abd soft, nontender, positive bowel sounds MSK no focal spinal tenderness, no joint edema Neuro: non-focal, well-oriented, appropriate affect Breasts: Deferred   Lab Results  Component Value Date   WBC 8.6 01/21/2024   HGB 16.8 01/21/2024   HCT 51.9 01/21/2024   MCV 92.7 01/21/2024   PLT 199 01/21/2024   Lab Results  Component Value Date   FERRITIN 19 (L) 01/21/2024   IRON 96 01/21/2024   TIBC 335 01/21/2024   UIBC 239 01/21/2024   IRONPCTSAT 29 01/21/2024   Lab Results  Component Value Date   RETICCTPCT 1.1 07/20/2023   RBC 5.60 01/21/2024   No results found for: "KPAFRELGTCHN", "LAMBDASER", "KAPLAMBRATIO" No results found for: "IGGSERUM", "IGA", "IGMSERUM" No results found for: "TOTALPROTELP", "ALBUMINELP", "A1GS", "A2GS", "BETS", "BETA2SER", "GAMS", "MSPIKE", "SPEI"   Chemistry      Component Value Date/Time   NA 141 01/21/2024 1009   K 4.3 01/21/2024 1009   CL 102  01/21/2024 1009   CO2 29 01/21/2024 1009   BUN 22 01/21/2024 1009   CREATININE 1.61 (H) 01/21/2024 1009      Component Value Date/Time   CALCIUM 9.4 01/21/2024 1009   ALKPHOS 74 01/21/2024 1009   AST 11 (L) 01/21/2024 1009   ALT 10 01/21/2024 1009   BILITOT 1.5 (H) 01/21/2024 1009       Impression and Plan: Mr. Toller is a pleasant 78 yo caucasian gentleman with secondary polycythemia with testosterone  injections as well as IDA secondary to regular phlebotomy.  Iron studies are pending.  Hgb is stable at 15 with last weeks blood donation.  He plans to donate with [facility] every 2 months. Follow-up in 4 months.   Kennard Pea, NP  4/28/20251:03 PM

## 2024-02-15 ENCOUNTER — Telehealth: Payer: Self-pay | Admitting: Family

## 2024-02-15 NOTE — Telephone Encounter (Signed)
 per inbasket pt needs 1 dose of IV iron. Lvm to return call for scheduling.

## 2024-02-16 ENCOUNTER — Encounter: Payer: Self-pay | Admitting: Hematology & Oncology

## 2024-02-16 ENCOUNTER — Inpatient Hospital Stay: Attending: Hematology & Oncology

## 2024-02-16 VITALS — BP 112/63 | HR 72 | Temp 98.6°F | Resp 18

## 2024-02-16 DIAGNOSIS — D500 Iron deficiency anemia secondary to blood loss (chronic): Secondary | ICD-10-CM | POA: Diagnosis not present

## 2024-02-16 DIAGNOSIS — D751 Secondary polycythemia: Secondary | ICD-10-CM | POA: Diagnosis not present

## 2024-02-16 MED ORDER — SODIUM CHLORIDE 0.9 % IV SOLN
1000.0000 mg | Freq: Once | INTRAVENOUS | Status: AC
  Administered 2024-02-16: 1000 mg via INTRAVENOUS
  Filled 2024-02-16: qty 10

## 2024-02-16 MED ORDER — SODIUM CHLORIDE 0.9 % IV SOLN: INTRAVENOUS | Status: DC

## 2024-02-16 NOTE — Patient Instructions (Signed)

## 2024-02-16 NOTE — Progress Notes (Signed)
 Patient does not want to stay for the recommended 30 minute post IV iron observation. Patient discharged ambulatory without complaints or concerns.

## 2024-02-25 DIAGNOSIS — H353221 Exudative age-related macular degeneration, left eye, with active choroidal neovascularization: Secondary | ICD-10-CM | POA: Diagnosis not present

## 2024-03-08 DIAGNOSIS — L578 Other skin changes due to chronic exposure to nonionizing radiation: Secondary | ICD-10-CM | POA: Diagnosis not present

## 2024-03-08 DIAGNOSIS — L821 Other seborrheic keratosis: Secondary | ICD-10-CM | POA: Diagnosis not present

## 2024-03-08 DIAGNOSIS — L820 Inflamed seborrheic keratosis: Secondary | ICD-10-CM | POA: Diagnosis not present

## 2024-03-21 DIAGNOSIS — M109 Gout, unspecified: Secondary | ICD-10-CM | POA: Diagnosis not present

## 2024-03-21 DIAGNOSIS — N1832 Chronic kidney disease, stage 3b: Secondary | ICD-10-CM | POA: Diagnosis not present

## 2024-03-21 DIAGNOSIS — E785 Hyperlipidemia, unspecified: Secondary | ICD-10-CM | POA: Diagnosis not present

## 2024-03-21 DIAGNOSIS — E7849 Other hyperlipidemia: Secondary | ICD-10-CM | POA: Diagnosis not present

## 2024-03-21 DIAGNOSIS — I129 Hypertensive chronic kidney disease with stage 1 through stage 4 chronic kidney disease, or unspecified chronic kidney disease: Secondary | ICD-10-CM | POA: Diagnosis not present

## 2024-03-21 DIAGNOSIS — R972 Elevated prostate specific antigen [PSA]: Secondary | ICD-10-CM | POA: Diagnosis not present

## 2024-03-21 DIAGNOSIS — E291 Testicular hypofunction: Secondary | ICD-10-CM | POA: Diagnosis not present

## 2024-03-21 DIAGNOSIS — E559 Vitamin D deficiency, unspecified: Secondary | ICD-10-CM | POA: Diagnosis not present

## 2024-03-22 ENCOUNTER — Inpatient Hospital Stay: Attending: Hematology & Oncology

## 2024-03-22 VITALS — BP 120/73 | HR 74 | Temp 97.7°F | Resp 18

## 2024-03-22 DIAGNOSIS — D508 Other iron deficiency anemias: Secondary | ICD-10-CM | POA: Insufficient documentation

## 2024-03-22 DIAGNOSIS — D751 Secondary polycythemia: Secondary | ICD-10-CM | POA: Diagnosis not present

## 2024-03-22 DIAGNOSIS — D500 Iron deficiency anemia secondary to blood loss (chronic): Secondary | ICD-10-CM

## 2024-03-22 MED ORDER — SODIUM CHLORIDE 0.9 % IV SOLN
1000.0000 mg | Freq: Once | INTRAVENOUS | Status: AC
  Administered 2024-03-22: 1000 mg via INTRAVENOUS
  Filled 2024-03-22: qty 10

## 2024-03-22 MED ORDER — SODIUM CHLORIDE 0.9 % IV SOLN: INTRAVENOUS | Status: DC

## 2024-03-22 NOTE — Progress Notes (Signed)
 Patient tolerated IV iron without any issues. Patient declined to stay for 30 minute observation period. Vitals obtained and remain stable. Patient discharged alert and ambulatory without any complaints.

## 2024-03-22 NOTE — Patient Instructions (Signed)

## 2024-03-23 ENCOUNTER — Inpatient Hospital Stay

## 2024-03-25 DIAGNOSIS — L603 Nail dystrophy: Secondary | ICD-10-CM | POA: Diagnosis not present

## 2024-03-25 DIAGNOSIS — Z789 Other specified health status: Secondary | ICD-10-CM | POA: Diagnosis not present

## 2024-03-25 DIAGNOSIS — L602 Onychogryphosis: Secondary | ICD-10-CM | POA: Diagnosis not present

## 2024-03-25 DIAGNOSIS — Z7409 Other reduced mobility: Secondary | ICD-10-CM | POA: Diagnosis not present

## 2024-03-28 DIAGNOSIS — E669 Obesity, unspecified: Secondary | ICD-10-CM | POA: Diagnosis not present

## 2024-03-28 DIAGNOSIS — E785 Hyperlipidemia, unspecified: Secondary | ICD-10-CM | POA: Diagnosis not present

## 2024-03-28 DIAGNOSIS — J329 Chronic sinusitis, unspecified: Secondary | ICD-10-CM | POA: Diagnosis not present

## 2024-03-28 DIAGNOSIS — Z1339 Encounter for screening examination for other mental health and behavioral disorders: Secondary | ICD-10-CM | POA: Diagnosis not present

## 2024-03-28 DIAGNOSIS — Z1331 Encounter for screening for depression: Secondary | ICD-10-CM | POA: Diagnosis not present

## 2024-03-28 DIAGNOSIS — Z0000 Encounter for general adult medical examination without abnormal findings: Secondary | ICD-10-CM | POA: Diagnosis not present

## 2024-03-28 DIAGNOSIS — N4282 Prostatosis syndrome: Secondary | ICD-10-CM | POA: Diagnosis not present

## 2024-03-28 DIAGNOSIS — M109 Gout, unspecified: Secondary | ICD-10-CM | POA: Diagnosis not present

## 2024-03-28 DIAGNOSIS — D751 Secondary polycythemia: Secondary | ICD-10-CM | POA: Diagnosis not present

## 2024-03-28 DIAGNOSIS — I129 Hypertensive chronic kidney disease with stage 1 through stage 4 chronic kidney disease, or unspecified chronic kidney disease: Secondary | ICD-10-CM | POA: Diagnosis not present

## 2024-03-28 DIAGNOSIS — M069 Rheumatoid arthritis, unspecified: Secondary | ICD-10-CM | POA: Diagnosis not present

## 2024-03-28 DIAGNOSIS — N1832 Chronic kidney disease, stage 3b: Secondary | ICD-10-CM | POA: Diagnosis not present

## 2024-03-28 DIAGNOSIS — R82998 Other abnormal findings in urine: Secondary | ICD-10-CM | POA: Diagnosis not present

## 2024-03-28 DIAGNOSIS — H353220 Exudative age-related macular degeneration, left eye, stage unspecified: Secondary | ICD-10-CM | POA: Diagnosis not present

## 2024-03-28 DIAGNOSIS — J45909 Unspecified asthma, uncomplicated: Secondary | ICD-10-CM | POA: Diagnosis not present

## 2024-03-28 DIAGNOSIS — E291 Testicular hypofunction: Secondary | ICD-10-CM | POA: Diagnosis not present

## 2024-04-21 DIAGNOSIS — H353221 Exudative age-related macular degeneration, left eye, with active choroidal neovascularization: Secondary | ICD-10-CM | POA: Diagnosis not present

## 2024-05-12 DIAGNOSIS — M51362 Other intervertebral disc degeneration, lumbar region with discogenic back pain and lower extremity pain: Secondary | ICD-10-CM | POA: Diagnosis not present

## 2024-05-12 DIAGNOSIS — Z79891 Long term (current) use of opiate analgesic: Secondary | ICD-10-CM | POA: Diagnosis not present

## 2024-05-12 DIAGNOSIS — M791 Myalgia, unspecified site: Secondary | ICD-10-CM | POA: Diagnosis not present

## 2024-05-12 DIAGNOSIS — Z79899 Other long term (current) drug therapy: Secondary | ICD-10-CM | POA: Diagnosis not present

## 2024-05-12 DIAGNOSIS — M79642 Pain in left hand: Secondary | ICD-10-CM | POA: Diagnosis not present

## 2024-05-12 DIAGNOSIS — M79605 Pain in left leg: Secondary | ICD-10-CM | POA: Diagnosis not present

## 2024-05-12 DIAGNOSIS — M545 Low back pain, unspecified: Secondary | ICD-10-CM | POA: Diagnosis not present

## 2024-05-12 DIAGNOSIS — M542 Cervicalgia: Secondary | ICD-10-CM | POA: Diagnosis not present

## 2024-05-23 ENCOUNTER — Encounter: Payer: Self-pay | Admitting: Hematology & Oncology

## 2024-05-23 DIAGNOSIS — L602 Onychogryphosis: Secondary | ICD-10-CM | POA: Diagnosis not present

## 2024-05-23 DIAGNOSIS — L600 Ingrowing nail: Secondary | ICD-10-CM | POA: Diagnosis not present

## 2024-05-23 DIAGNOSIS — Z7409 Other reduced mobility: Secondary | ICD-10-CM | POA: Diagnosis not present

## 2024-05-23 DIAGNOSIS — Z789 Other specified health status: Secondary | ICD-10-CM | POA: Diagnosis not present

## 2024-05-23 DIAGNOSIS — L603 Nail dystrophy: Secondary | ICD-10-CM | POA: Diagnosis not present

## 2024-05-30 ENCOUNTER — Inpatient Hospital Stay (HOSPITAL_BASED_OUTPATIENT_CLINIC_OR_DEPARTMENT_OTHER): Admitting: Family

## 2024-05-30 ENCOUNTER — Inpatient Hospital Stay

## 2024-05-30 ENCOUNTER — Inpatient Hospital Stay: Attending: Hematology & Oncology

## 2024-05-30 VITALS — BP 137/66 | HR 75 | Temp 98.7°F | Resp 17 | Ht 65.0 in | Wt 198.8 lb

## 2024-05-30 DIAGNOSIS — D751 Secondary polycythemia: Secondary | ICD-10-CM | POA: Insufficient documentation

## 2024-05-30 DIAGNOSIS — D500 Iron deficiency anemia secondary to blood loss (chronic): Secondary | ICD-10-CM | POA: Diagnosis not present

## 2024-05-30 LAB — CBC WITH DIFFERENTIAL (CANCER CENTER ONLY)
Abs Immature Granulocytes: 0.07 K/uL (ref 0.00–0.07)
Basophils Absolute: 0.1 K/uL (ref 0.0–0.1)
Basophils Relative: 1 %
Eosinophils Absolute: 0.1 K/uL (ref 0.0–0.5)
Eosinophils Relative: 1 %
HCT: 50.6 % (ref 39.0–52.0)
Hemoglobin: 16.4 g/dL (ref 13.0–17.0)
Immature Granulocytes: 1 %
Lymphocytes Relative: 25 %
Lymphs Abs: 2.3 K/uL (ref 0.7–4.0)
MCH: 30.4 pg (ref 26.0–34.0)
MCHC: 32.4 g/dL (ref 30.0–36.0)
MCV: 93.9 fL (ref 80.0–100.0)
Monocytes Absolute: 1 K/uL (ref 0.1–1.0)
Monocytes Relative: 11 %
Neutro Abs: 5.6 K/uL (ref 1.7–7.7)
Neutrophils Relative %: 61 %
Platelet Count: 182 K/uL (ref 150–400)
RBC: 5.39 MIL/uL (ref 4.22–5.81)
RDW: 15.7 % — ABNORMAL HIGH (ref 11.5–15.5)
WBC Count: 9.1 K/uL (ref 4.0–10.5)
nRBC: 0 % (ref 0.0–0.2)

## 2024-05-30 LAB — IRON AND IRON BINDING CAPACITY (CC-WL,HP ONLY)
Iron: 90 ug/dL (ref 45–182)
Saturation Ratios: 31 % (ref 17.9–39.5)
TIBC: 291 ug/dL (ref 250–450)
UIBC: 201 ug/dL

## 2024-05-30 LAB — CMP (CANCER CENTER ONLY)
ALT: 9 U/L (ref 0–44)
AST: 16 U/L (ref 15–41)
Albumin: 4.3 g/dL (ref 3.5–5.0)
Alkaline Phosphatase: 66 U/L (ref 38–126)
Anion gap: 11 (ref 5–15)
BUN: 23 mg/dL (ref 8–23)
CO2: 27 mmol/L (ref 22–32)
Calcium: 9.9 mg/dL (ref 8.9–10.3)
Chloride: 102 mmol/L (ref 98–111)
Creatinine: 1.76 mg/dL — ABNORMAL HIGH (ref 0.61–1.24)
GFR, Estimated: 39 mL/min — ABNORMAL LOW (ref >60)
Glucose, Bld: 79 mg/dL (ref 70–99)
Potassium: 4.8 mmol/L (ref 3.5–5.1)
Sodium: 140 mmol/L (ref 135–145)
Total Bilirubin: 0.8 mg/dL (ref 0.0–1.2)
Total Protein: 6.4 g/dL — ABNORMAL LOW (ref 6.5–8.1)

## 2024-05-30 LAB — FERRITIN: Ferritin: 213 ng/mL (ref 24–336)

## 2024-05-30 NOTE — Progress Notes (Unsigned)
 Hematology and Oncology Follow Up Visit  [name] [id number] 05-08-1946 78 y.o. 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 IV iron EC ASA 81 mg po q day               Interim History:  [name] is here today for follow-up. He is doing well but notes dizziness when he first gets up in the AM and will sit on the side of the bed for a few minutes before getting out.  He has some intermittent join aches and pains with arthritis.  He donated blood with [facility] in the last 2 weeks.  No other blood loss noted. No abnormal bruising, no petechiae.  He still gets his testosterone  injection once every 2 weeks.  No fever, chills, n/v, cough, rash, dizziness, SOB, chest pain, palpitations, abdominal pain or changes in bowel or bladder habits.  No swelling, tenderness, numbness or tingling in his extremities at this time.  No falls or syncope reported.  Appetite and hydration are good. Weight is stable at 198 lbs.   ECOG Performance Status: 1 - Symptomatic but completely ambulatory  Medications:  Allergies as of 05/30/2024       Reactions   Caffeine Other (See Comments)   Urinary retention and jittery   Codeine Other (See Comments)   Jittery   Iodinated Contrast Media Other (See Comments)   Hard on kidneys. The blue dye that went through my IV.    Pseudoephedrine Other (See Comments)   Urinary retention   Simvastatin Other (See Comments)   Hurt my bones   Penicillins Itching   Chlorpheniramine-pseudoeph    Infliximab     Other reaction(s): ear infections        Medication List        Accurate as of May 30, 2024  1:08 PM. If you have any questions, ask your nurse or doctor.          albuterol  (2.5 MG/3ML) 0.083% nebulizer solution Commonly known as: PROVENTIL  Take 3 mLs (2.5 mg total) by  nebulization every 4 (four) hours as needed for wheezing or shortness of breath.   albuterol  108 (90 Base) MCG/ACT inhaler Commonly known as: VENTOLIN  HFA INHALE 2 PUFFS EVERY 4 TO 6 HOURS IF NEEDED FOR COUGH OR WHEEZE   alfuzosin 10 MG 24 hr tablet Commonly known as: UROXATRAL Take 10 mg by mouth daily with breakfast.   allopurinol  100 MG tablet Commonly known as: ZYLOPRIM  2 tablet Orally Once a day   amLODipine 2.5 MG tablet Commonly known as: NORVASC Take 1 tablet by mouth 2 (two) times daily.   Azelastine  HCl 137 MCG/SPRAY Soln Place 1 spray into the nose 2 (two) times daily. Use 2 sprays in each nostril 1-2 times daily   Besivance 0.6 % Susp Generic drug: Besifloxacin HCl   Cialis 20 MG tablet Generic drug: tadalafil 1 tablet   cromolyn 4 % ophthalmic solution Commonly known as: OPTICROM 1 drop 4 (four) times daily.   dicyclomine  10 MG capsule Commonly known as: BENTYL  take 1 capsule by mouth every 6 hours if needed for BLOATING   fluticasone  50 MCG/ACT nasal spray Commonly known as: FLONASE  Place 2 sprays into both nostrils daily as needed (use for 1-2 weeks at a time.).   furosemide 40 MG tablet Commonly known as: LASIX Take 40  mg by mouth daily.   HYDROcodone -acetaminophen  10-325 MG tablet Commonly known as: NORCO Take by mouth.   Klor-Con  M10 10 MEQ tablet Generic drug: potassium chloride  1 tablet with food Orally Twice a day   levocetirizine 5 MG tablet Commonly known as: XYZAL    omeprazole  20 MG capsule Commonly known as: PRILOSEC 1 capsule   oxybutynin 5 MG tablet Commonly known as: DITROPAN Take by mouth.   testosterone  cypionate 100 MG/ML injection Commonly known as: DEPOTESTOTERONE CYPIONATE 0.5ml   triamcinolone  cream 0.5 % Commonly known as: KENALOG    trimethoprim-polymyxin b ophthalmic solution Commonly known as: POLYTRIM   VITAMIN D3 PO Take by mouth daily.        Allergies:  Allergies  Allergen Reactions   Caffeine  Other (See Comments)    Urinary retention and jittery   Codeine Other (See Comments)    Jittery   Iodinated Contrast Media Other (See Comments)    Hard on kidneys. The blue dye that went through my IV.    Pseudoephedrine Other (See Comments)    Urinary retention   Simvastatin Other (See Comments)    Hurt my bones   Penicillins Itching   Chlorpheniramine-Pseudoeph    Infliximab      Other reaction(s): ear infections    Past Medical History, Surgical history, Social history, and Family History were reviewed and updated.  Review of Systems: All other 10 point review of systems is negative.   Physical Exam:  height is 5' 5 (1.651 m) and weight is 198 lb 12.8 oz (90.2 kg). His oral temperature is 98.7 F (37.1 C). His blood pressure is 137/66 and his pulse is 75. His respiration is 17 and oxygen  saturation is 96%.   Wt Readings from Last 3 Encounters:  05/30/24 198 lb 12.8 oz (90.2 kg)  02/01/24 195 lb 0.6 oz (88.5 kg)  11/02/23 205 lb (93 kg)    Ocular: Sclerae unicteric, pupils equal, round and reactive to light Ear-nose-throat: Oropharynx clear, dentition fair Lymphatic: No cervical or supraclavicular adenopathy Lungs no rales or rhonchi, good excursion bilaterally Heart regular rate and rhythm, no murmur appreciated Abd soft, nontender, positive bowel sounds MSK no focal spinal tenderness, no joint edema Neuro: non-focal, well-oriented, appropriate affect Breasts: Deferred   Lab Results  Component Value Date   WBC 9.1 05/30/2024   HGB 16.4 05/30/2024   HCT 50.6 05/30/2024   MCV 93.9 05/30/2024   PLT 182 05/30/2024   Lab Results  Component Value Date   FERRITIN 24 02/01/2024   IRON 39 (L) 02/01/2024   TIBC 358 02/01/2024   UIBC 319 02/01/2024   IRONPCTSAT 11 (L) 02/01/2024   Lab Results  Component Value Date   RETICCTPCT 1.1 07/20/2023   RBC 5.39 05/30/2024   No results found for: KPAFRELGTCHN, LAMBDASER, KAPLAMBRATIO No results found for:  IGGSERUM, IGA, IGMSERUM No results found for: STEPHANY CARLOTA BENSON MARKEL EARLA JOANNIE DOC VICK, SPEI   Chemistry      Component Value Date/Time   NA 140 02/01/2024 1237   K 4.7 02/01/2024 1237   CL 101 02/01/2024 1237   CO2 30 02/01/2024 1237   BUN 29 (H) 02/01/2024 1237   CREATININE 1.82 (H) 02/01/2024 1237      Component Value Date/Time   CALCIUM 9.4 02/01/2024 1237   ALKPHOS 69 02/01/2024 1237   AST 10 (L) 02/01/2024 1237   ALT 11 02/01/2024 1237   BILITOT 0.9 02/01/2024 1237       Impression and Plan: Mr. Lyerly is a  pleasant 78 yo caucasian gentleman with secondary polycythemia with testosterone  injections as well as IDA secondary to regular phlebotomy.  Iron studies are pending.  Hgb is 16.4. [facility].   He plans to continue to donate with [facility] every 2 months. Follow-up in 4 months.   Lauraine Pepper, NP 8/25/20251:08 PM

## 2024-05-31 ENCOUNTER — Encounter: Payer: Self-pay | Admitting: Hematology & Oncology

## 2024-06-16 DIAGNOSIS — H353221 Exudative age-related macular degeneration, left eye, with active choroidal neovascularization: Secondary | ICD-10-CM | POA: Diagnosis not present

## 2024-06-28 DIAGNOSIS — E291 Testicular hypofunction: Secondary | ICD-10-CM | POA: Diagnosis not present

## 2024-06-28 DIAGNOSIS — R350 Frequency of micturition: Secondary | ICD-10-CM | POA: Diagnosis not present

## 2024-06-28 DIAGNOSIS — M5416 Radiculopathy, lumbar region: Secondary | ICD-10-CM | POA: Diagnosis not present

## 2024-06-30 DIAGNOSIS — H353221 Exudative age-related macular degeneration, left eye, with active choroidal neovascularization: Secondary | ICD-10-CM | POA: Diagnosis not present

## 2024-06-30 DIAGNOSIS — H4322 Crystalline deposits in vitreous body, left eye: Secondary | ICD-10-CM | POA: Diagnosis not present

## 2024-06-30 DIAGNOSIS — H353111 Nonexudative age-related macular degeneration, right eye, early dry stage: Secondary | ICD-10-CM | POA: Diagnosis not present

## 2024-07-05 ENCOUNTER — Encounter: Payer: Self-pay | Admitting: Hematology & Oncology

## 2024-07-11 DIAGNOSIS — Z23 Encounter for immunization: Secondary | ICD-10-CM | POA: Diagnosis not present

## 2024-07-27 DIAGNOSIS — Z23 Encounter for immunization: Secondary | ICD-10-CM | POA: Diagnosis not present

## 2024-07-29 DIAGNOSIS — L603 Nail dystrophy: Secondary | ICD-10-CM | POA: Diagnosis not present

## 2024-07-29 DIAGNOSIS — Z789 Other specified health status: Secondary | ICD-10-CM | POA: Diagnosis not present

## 2024-07-29 DIAGNOSIS — L602 Onychogryphosis: Secondary | ICD-10-CM | POA: Diagnosis not present

## 2024-07-29 DIAGNOSIS — Z7409 Other reduced mobility: Secondary | ICD-10-CM | POA: Diagnosis not present

## 2024-08-01 DIAGNOSIS — H57813 Brow ptosis, bilateral: Secondary | ICD-10-CM | POA: Diagnosis not present

## 2024-08-01 DIAGNOSIS — H0279 Other degenerative disorders of eyelid and periocular area: Secondary | ICD-10-CM | POA: Diagnosis not present

## 2024-08-01 DIAGNOSIS — H02834 Dermatochalasis of left upper eyelid: Secondary | ICD-10-CM | POA: Diagnosis not present

## 2024-08-01 DIAGNOSIS — H02831 Dermatochalasis of right upper eyelid: Secondary | ICD-10-CM | POA: Diagnosis not present

## 2024-08-01 DIAGNOSIS — H02413 Mechanical ptosis of bilateral eyelids: Secondary | ICD-10-CM | POA: Diagnosis not present

## 2024-08-01 DIAGNOSIS — H02532 Eyelid retraction right lower eyelid: Secondary | ICD-10-CM | POA: Diagnosis not present

## 2024-08-01 DIAGNOSIS — H02423 Myogenic ptosis of bilateral eyelids: Secondary | ICD-10-CM | POA: Diagnosis not present

## 2024-08-01 DIAGNOSIS — H02421 Myogenic ptosis of right eyelid: Secondary | ICD-10-CM | POA: Diagnosis not present

## 2024-08-01 DIAGNOSIS — H02411 Mechanical ptosis of right eyelid: Secondary | ICD-10-CM | POA: Diagnosis not present

## 2024-08-01 DIAGNOSIS — H02412 Mechanical ptosis of left eyelid: Secondary | ICD-10-CM | POA: Diagnosis not present

## 2024-08-01 DIAGNOSIS — H02535 Eyelid retraction left lower eyelid: Secondary | ICD-10-CM | POA: Diagnosis not present

## 2024-08-01 DIAGNOSIS — H02422 Myogenic ptosis of left eyelid: Secondary | ICD-10-CM | POA: Diagnosis not present

## 2024-08-05 DIAGNOSIS — E291 Testicular hypofunction: Secondary | ICD-10-CM | POA: Diagnosis not present

## 2024-08-05 DIAGNOSIS — D751 Secondary polycythemia: Secondary | ICD-10-CM | POA: Diagnosis not present

## 2024-08-05 DIAGNOSIS — N5201 Erectile dysfunction due to arterial insufficiency: Secondary | ICD-10-CM | POA: Diagnosis not present

## 2024-08-18 ENCOUNTER — Other Ambulatory Visit: Payer: Self-pay | Admitting: *Deleted

## 2024-08-18 DIAGNOSIS — D45 Polycythemia vera: Secondary | ICD-10-CM

## 2024-08-18 DIAGNOSIS — D500 Iron deficiency anemia secondary to blood loss (chronic): Secondary | ICD-10-CM

## 2024-08-18 DIAGNOSIS — D692 Other nonthrombocytopenic purpura: Secondary | ICD-10-CM

## 2024-08-18 DIAGNOSIS — N1832 Chronic kidney disease, stage 3b: Secondary | ICD-10-CM

## 2024-08-18 DIAGNOSIS — D751 Secondary polycythemia: Secondary | ICD-10-CM

## 2024-08-18 DIAGNOSIS — I129 Hypertensive chronic kidney disease with stage 1 through stage 4 chronic kidney disease, or unspecified chronic kidney disease: Secondary | ICD-10-CM

## 2024-08-19 ENCOUNTER — Inpatient Hospital Stay: Attending: Hematology & Oncology

## 2024-08-19 DIAGNOSIS — D500 Iron deficiency anemia secondary to blood loss (chronic): Secondary | ICD-10-CM

## 2024-08-19 DIAGNOSIS — D751 Secondary polycythemia: Secondary | ICD-10-CM | POA: Diagnosis not present

## 2024-08-19 DIAGNOSIS — I129 Hypertensive chronic kidney disease with stage 1 through stage 4 chronic kidney disease, or unspecified chronic kidney disease: Secondary | ICD-10-CM

## 2024-08-19 DIAGNOSIS — D508 Other iron deficiency anemias: Secondary | ICD-10-CM | POA: Diagnosis not present

## 2024-08-19 DIAGNOSIS — D45 Polycythemia vera: Secondary | ICD-10-CM

## 2024-08-19 DIAGNOSIS — N1832 Chronic kidney disease, stage 3b: Secondary | ICD-10-CM

## 2024-08-19 DIAGNOSIS — D692 Other nonthrombocytopenic purpura: Secondary | ICD-10-CM

## 2024-08-19 LAB — IRON AND IRON BINDING CAPACITY (CC-WL,HP ONLY)
Iron: 90 ug/dL (ref 45–182)
Saturation Ratios: 32 % (ref 17.9–39.5)
TIBC: 284 ug/dL (ref 250–450)
UIBC: 194 ug/dL

## 2024-08-19 LAB — CBC WITH DIFFERENTIAL (CANCER CENTER ONLY)
Abs Immature Granulocytes: 0.05 K/uL (ref 0.00–0.07)
Basophils Absolute: 0 K/uL (ref 0.0–0.1)
Basophils Relative: 1 %
Eosinophils Absolute: 0.1 K/uL (ref 0.0–0.5)
Eosinophils Relative: 2 %
HCT: 50.4 % (ref 39.0–52.0)
Hemoglobin: 16.4 g/dL (ref 13.0–17.0)
Immature Granulocytes: 1 %
Lymphocytes Relative: 32 %
Lymphs Abs: 2.1 K/uL (ref 0.7–4.0)
MCH: 30.5 pg (ref 26.0–34.0)
MCHC: 32.5 g/dL (ref 30.0–36.0)
MCV: 93.7 fL (ref 80.0–100.0)
Monocytes Absolute: 0.7 K/uL (ref 0.1–1.0)
Monocytes Relative: 12 %
Neutro Abs: 3.4 K/uL (ref 1.7–7.7)
Neutrophils Relative %: 52 %
Platelet Count: 159 K/uL (ref 150–400)
RBC: 5.38 MIL/uL (ref 4.22–5.81)
RDW: 13.6 % (ref 11.5–15.5)
WBC Count: 6.5 K/uL (ref 4.0–10.5)
nRBC: 0 % (ref 0.0–0.2)

## 2024-08-19 LAB — CMP (CANCER CENTER ONLY)
ALT: 8 U/L (ref 0–44)
AST: 17 U/L (ref 15–41)
Albumin: 4 g/dL (ref 3.5–5.0)
Alkaline Phosphatase: 68 U/L (ref 38–126)
Anion gap: 12 (ref 5–15)
BUN: 27 mg/dL — ABNORMAL HIGH (ref 8–23)
CO2: 27 mmol/L (ref 22–32)
Calcium: 9.5 mg/dL (ref 8.9–10.3)
Chloride: 103 mmol/L (ref 98–111)
Creatinine: 1.95 mg/dL — ABNORMAL HIGH (ref 0.61–1.24)
GFR, Estimated: 34 mL/min — ABNORMAL LOW (ref >60)
Glucose, Bld: 99 mg/dL (ref 70–99)
Potassium: 4.4 mmol/L (ref 3.5–5.1)
Sodium: 142 mmol/L (ref 135–145)
Total Bilirubin: 0.7 mg/dL (ref 0.0–1.2)
Total Protein: 6.4 g/dL — ABNORMAL LOW (ref 6.5–8.1)

## 2024-08-19 LAB — FERRITIN: Ferritin: 120 ng/mL (ref 24–336)

## 2024-08-25 DIAGNOSIS — H353221 Exudative age-related macular degeneration, left eye, with active choroidal neovascularization: Secondary | ICD-10-CM | POA: Diagnosis not present

## 2024-08-29 DIAGNOSIS — H53483 Generalized contraction of visual field, bilateral: Secondary | ICD-10-CM | POA: Diagnosis not present

## 2024-09-12 DIAGNOSIS — M5416 Radiculopathy, lumbar region: Secondary | ICD-10-CM | POA: Diagnosis not present

## 2024-09-12 DIAGNOSIS — Z79891 Long term (current) use of opiate analgesic: Secondary | ICD-10-CM | POA: Diagnosis not present

## 2024-09-12 DIAGNOSIS — M7918 Myalgia, other site: Secondary | ICD-10-CM | POA: Diagnosis not present

## 2024-09-12 DIAGNOSIS — K5900 Constipation, unspecified: Secondary | ICD-10-CM | POA: Diagnosis not present

## 2024-09-17 DIAGNOSIS — M791 Myalgia, unspecified site: Secondary | ICD-10-CM | POA: Diagnosis not present

## 2024-09-22 ENCOUNTER — Other Ambulatory Visit: Payer: Self-pay | Admitting: Allergy

## 2024-09-23 ENCOUNTER — Inpatient Hospital Stay: Attending: Hematology & Oncology

## 2024-09-23 DIAGNOSIS — D509 Iron deficiency anemia, unspecified: Secondary | ICD-10-CM | POA: Diagnosis not present

## 2024-09-23 DIAGNOSIS — J329 Chronic sinusitis, unspecified: Secondary | ICD-10-CM | POA: Insufficient documentation

## 2024-09-23 DIAGNOSIS — N182 Chronic kidney disease, stage 2 (mild): Secondary | ICD-10-CM | POA: Diagnosis not present

## 2024-09-23 DIAGNOSIS — D751 Secondary polycythemia: Secondary | ICD-10-CM | POA: Diagnosis present

## 2024-09-23 DIAGNOSIS — D500 Iron deficiency anemia secondary to blood loss (chronic): Secondary | ICD-10-CM

## 2024-09-23 LAB — CMP (CANCER CENTER ONLY)
ALT: 9 U/L (ref 0–44)
AST: 17 U/L (ref 15–41)
Albumin: 4.1 g/dL (ref 3.5–5.0)
Alkaline Phosphatase: 73 U/L (ref 38–126)
Anion gap: 9 (ref 5–15)
BUN: 21 mg/dL (ref 8–23)
CO2: 30 mmol/L (ref 22–32)
Calcium: 9.1 mg/dL (ref 8.9–10.3)
Chloride: 101 mmol/L (ref 98–111)
Creatinine: 1.85 mg/dL — ABNORMAL HIGH (ref 0.61–1.24)
GFR, Estimated: 36 mL/min — ABNORMAL LOW
Glucose, Bld: 97 mg/dL (ref 70–99)
Potassium: 4.7 mmol/L (ref 3.5–5.1)
Sodium: 139 mmol/L (ref 135–145)
Total Bilirubin: 1.1 mg/dL (ref 0.0–1.2)
Total Protein: 6 g/dL — ABNORMAL LOW (ref 6.5–8.1)

## 2024-09-23 LAB — CBC WITH DIFFERENTIAL (CANCER CENTER ONLY)
Abs Immature Granulocytes: 0.07 K/uL (ref 0.00–0.07)
Basophils Absolute: 0 K/uL (ref 0.0–0.1)
Basophils Relative: 1 %
Eosinophils Absolute: 0.1 K/uL (ref 0.0–0.5)
Eosinophils Relative: 1 %
HCT: 50 % (ref 39.0–52.0)
Hemoglobin: 16.4 g/dL (ref 13.0–17.0)
Immature Granulocytes: 1 %
Lymphocytes Relative: 26 %
Lymphs Abs: 1.8 K/uL (ref 0.7–4.0)
MCH: 30.2 pg (ref 26.0–34.0)
MCHC: 32.8 g/dL (ref 30.0–36.0)
MCV: 92.1 fL (ref 80.0–100.0)
Monocytes Absolute: 0.9 K/uL (ref 0.1–1.0)
Monocytes Relative: 12 %
Neutro Abs: 4.1 K/uL (ref 1.7–7.7)
Neutrophils Relative %: 59 %
Platelet Count: 154 K/uL (ref 150–400)
RBC: 5.43 MIL/uL (ref 4.22–5.81)
RDW: 14.5 % (ref 11.5–15.5)
WBC Count: 6.9 K/uL (ref 4.0–10.5)
nRBC: 0 % (ref 0.0–0.2)

## 2024-09-23 LAB — IRON AND IRON BINDING CAPACITY (CC-WL,HP ONLY)
Iron: 84 ug/dL (ref 45–182)
Saturation Ratios: 28 % (ref 17.9–39.5)
TIBC: 301 ug/dL (ref 250–450)
UIBC: 217 ug/dL

## 2024-09-23 LAB — FERRITIN: Ferritin: 53 ng/mL (ref 24–336)

## 2024-09-24 ENCOUNTER — Other Ambulatory Visit: Payer: Self-pay | Admitting: Allergy

## 2024-09-28 ENCOUNTER — Encounter: Payer: Self-pay | Admitting: Hematology & Oncology

## 2024-09-28 ENCOUNTER — Inpatient Hospital Stay: Admitting: Hematology & Oncology

## 2024-09-28 ENCOUNTER — Other Ambulatory Visit: Payer: Self-pay

## 2024-09-28 VITALS — BP 130/62 | HR 93 | Temp 97.9°F | Resp 18 | Wt 204.1 lb

## 2024-09-28 DIAGNOSIS — D751 Secondary polycythemia: Secondary | ICD-10-CM | POA: Diagnosis not present

## 2024-09-28 DIAGNOSIS — E299 Testicular dysfunction, unspecified: Secondary | ICD-10-CM

## 2024-09-28 DIAGNOSIS — D500 Iron deficiency anemia secondary to blood loss (chronic): Secondary | ICD-10-CM

## 2024-09-28 MED ORDER — AMOXICILLIN 500 MG PO TABS: 500.0000 mg | ORAL_TABLET | Freq: Two times a day (BID) | ORAL | 0 refills | Status: DC

## 2024-09-28 NOTE — Progress Notes (Signed)
 " Hematology and Oncology Follow Up Visit  [name] [id number] 01-15-1946 78 y.o. 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 IV iron EC ASA 81 mg po q day               Interim History:  [name] is here today for follow-up.  We last saw him back in August.  Since then, [name] has been doing okay.  Apparently, [name] has not been able to donate blood because the form that we sent and has run out.  We will send in another form with him.  [name] says his testosterone  injections worked very well.  His last testosterone  level was 700.  [name] feels well.  [name] has little bit of a sinus infection.  [name] has not been able to see his family doctor.  As such, we will send in some amoxicillin  for him.  This seems to work [name] says.  [name] has had no problems with bowels or bladder.  [name] has had no nausea or vomiting.  [name] has had no leg swelling.  [name] does have some mild chronic renal insufficiency.  [name] does see a Nephrologist.  Overall, I would say that his performance status is probably ECOG 1.  Medications:  Allergies as of 09/28/2024       Reactions   Caffeine Other (See Comments)   Urinary retention and jittery   Codeine Other (See Comments)   Jittery   Iodinated Contrast Media Other (See Comments)   Hard on kidneys. The blue dye that went through my IV.    Pseudoephedrine Other (See Comments)   Urinary retention   Simvastatin Other (See Comments)   Hurt my bones   Penicillins Itching   Chlorpheniramine-pseudoeph    Infliximab     Other reaction(s): ear infections        Medication List        Accurate as of September 28, 2024 10:28 AM. If you have any questions, ask your nurse or doctor.          STOP taking these medications    alfuzosin 10 MG 24 hr tablet Commonly known as: UROXATRAL Stopped by: Maude Crease,  MD   Besivance 0.6 % Susp Generic drug: Besifloxacin HCl Stopped by: Maude Crease, MD   dicyclomine  10 MG capsule Commonly known as: BENTYL  Stopped by: Maude Crease, MD   oxybutynin 5 MG tablet Commonly known as: DITROPAN Stopped by: Maude Crease, MD   trimethoprim-polymyxin b ophthalmic solution Commonly known as: POLYTRIM Stopped by: Maude Crease, MD       TAKE these medications    albuterol  (2.5 MG/3ML) 0.083% nebulizer solution Commonly known as: PROVENTIL  Take 3 mLs (2.5 mg total) by nebulization every 4 (four) hours as needed for wheezing or shortness of breath.   albuterol  108 (90 Base) MCG/ACT inhaler Commonly known as: VENTOLIN  HFA INHALE 2 PUFFS EVERY 4 TO 6 HOURS IF NEEDED FOR COUGH OR WHEEZE   allopurinol  100 MG tablet Commonly known as: ZYLOPRIM  2 tablet Orally Once a day   amLODipine 2.5 MG tablet Commonly known as: NORVASC Take 1 tablet by mouth 2 (two) times daily.   Azelastine  HCl 137 MCG/SPRAY Soln Place 1 spray into the nose 2 (two) times daily. Use 2 sprays in each nostril 1-2 times daily   Cialis 20 MG tablet Generic drug:  tadalafil 1 tablet   cromolyn 4 % ophthalmic solution Commonly known as: OPTICROM 1 drop 4 (four) times daily.   fluticasone  50 MCG/ACT nasal spray Commonly known as: FLONASE  Place 2 sprays into both nostrils daily as needed (use for 1-2 weeks at a time.).   furosemide 40 MG tablet Commonly known as: LASIX Take 40 mg by mouth daily.   HYDROcodone -acetaminophen  10-325 MG tablet Commonly known as: NORCO Take by mouth.   Klor-Con  M10 10 MEQ tablet Generic drug: potassium chloride  1 tablet with food Orally Twice a day   levocetirizine 5 MG tablet Commonly known as: XYZAL    omeprazole  20 MG capsule Commonly known as: PRILOSEC 1 capsule   testosterone  cypionate 200 MG/ML injection Commonly known as: DEPOTESTOSTERONE CYPIONATE Inject 200 mg into the muscle once a week. What changed: Another medication  with the same name was removed. Continue taking this medication, and follow the directions you see here. Changed by: Maude Crease, MD   triamcinolone  cream 0.5 % Commonly known as: KENALOG    VITAMIN D3 PO Take by mouth daily.        Allergies:  Allergies  Allergen Reactions   Caffeine Other (See Comments)    Urinary retention and jittery   Codeine Other (See Comments)    Jittery   Iodinated Contrast Media Other (See Comments)    Hard on kidneys. The blue dye that went through my IV.    Pseudoephedrine Other (See Comments)    Urinary retention   Simvastatin Other (See Comments)    Hurt my bones   Penicillins Itching   Chlorpheniramine-Pseudoeph    Infliximab      Other reaction(s): ear infections    Past Medical History, Surgical history, Social history, and Family History were reviewed and updated.  Review of Systems:  Review of Systems  Constitutional: Negative.   HENT: Negative.    Eyes: Negative.   Respiratory: Negative.    Cardiovascular: Negative.   Gastrointestinal: Negative.   Genitourinary: Negative.   Musculoskeletal: Negative.   Skin: Negative.   Neurological: Negative.   Endo/Heme/Allergies: Negative.   Psychiatric/Behavioral: Negative.       Physical Exam:  weight is 204 lb 1.9 oz (92.6 kg). His oral temperature is 97.9 F (36.6 C). His blood pressure is 130/62 and his pulse is 93. His respiration is 18 and oxygen  saturation is 96%.   Wt Readings from Last 3 Encounters:  09/28/24 204 lb 1.9 oz (92.6 kg)  05/30/24 198 lb 12.8 oz (90.2 kg)  02/01/24 195 lb 0.6 oz (88.5 kg)    Physical Exam Vitals reviewed.  HENT:     Head: Normocephalic and atraumatic.  Eyes:     Pupils: Pupils are equal, round, and reactive to light.  Cardiovascular:     Rate and Rhythm: Normal rate and regular rhythm.     Heart sounds: Normal heart sounds.  Pulmonary:     Effort: Pulmonary effort is normal.     Breath sounds: Normal breath sounds.  Abdominal:      General: Bowel sounds are normal.     Palpations: Abdomen is soft.  Musculoskeletal:        General: No tenderness or deformity. Normal range of motion.     Cervical back: Normal range of motion.  Lymphadenopathy:     Cervical: No cervical adenopathy.  Skin:    General: Skin is warm and dry.     Findings: No erythema or rash.     Comments: [name] does have a little bit of  a tawny complexion.  Neurological:     Mental Status: [name] is alert and oriented to person, place, and time.  Psychiatric:        Behavior: Behavior normal.        Thought Content: Thought content normal.        Judgment: Judgment normal.      Lab Results  Component Value Date   WBC 6.9 09/23/2024   HGB 16.4 09/23/2024   HCT 50.0 09/23/2024   MCV 92.1 09/23/2024   PLT 154 09/23/2024   Lab Results  Component Value Date   FERRITIN 53 09/23/2024   IRON 84 09/23/2024   TIBC 301 09/23/2024   UIBC 217 09/23/2024   IRONPCTSAT 28 09/23/2024   Lab Results  Component Value Date   RETICCTPCT 1.1 07/20/2023   RBC 5.43 09/23/2024   No results found for: KPAFRELGTCHN, LAMBDASER, KAPLAMBRATIO No results found for: IGGSERUM, IGA, IGMSERUM No results found for: STEPHANY CARLOTA BENSON MARKEL EARLA JOANNIE DOC VICK, SPEI   Chemistry      Component Value Date/Time   NA 139 09/23/2024 1312   K 4.7 09/23/2024 1312   CL 101 09/23/2024 1312   CO2 30 09/23/2024 1312   BUN 21 09/23/2024 1312   CREATININE 1.85 (H) 09/23/2024 1312      Component Value Date/Time   CALCIUM 9.1 09/23/2024 1312   ALKPHOS 73 09/23/2024 1312   AST 17 09/23/2024 1312   ALT 9 09/23/2024 1312   BILITOT 1.1 09/23/2024 1312       Impression and Plan: [name] is a pleasant 78 yo caucasian gentleman with secondary polycythemia with testosterone  injections.  Again, we will get him onto a phlebotomy program again.  [name] can certainly have phlebotomies every 4 weeks from my point of view.  We will go  ahead and plan to get him back in 4 more months.  We would see his blood a week before we see him back.  This works very well for us .  I did give him a form to send back to [facility].   Maude JONELLE Crease, MD 12/24/202510:28 AM  "

## 2024-09-30 ENCOUNTER — Ambulatory Visit: Admitting: Family

## 2024-09-30 ENCOUNTER — Inpatient Hospital Stay

## 2024-09-30 ENCOUNTER — Ambulatory Visit: Admitting: Hematology & Oncology

## 2024-10-04 ENCOUNTER — Telehealth: Payer: Self-pay | Admitting: Allergy

## 2024-10-04 MED ORDER — FLUTICASONE PROPIONATE 50 MCG/ACT NA SUSP: 2.0000 | Freq: Every day | NASAL | 5 refills | Status: AC | PRN

## 2024-10-04 NOTE — Telephone Encounter (Signed)
 Refill sent to pharmacy.

## 2024-10-04 NOTE — Telephone Encounter (Signed)
 Patient is requesting a refill on Flonase  to [facility] in [place].

## 2024-10-05 ENCOUNTER — Ambulatory Visit (INDEPENDENT_AMBULATORY_CARE_PROVIDER_SITE_OTHER): Admitting: Allergy and Immunology

## 2024-10-05 VITALS — BP 122/68 | HR 80 | Temp 98.8°F | Resp 14 | Ht 69.5 in | Wt 205.8 lb

## 2024-10-05 DIAGNOSIS — J4540 Moderate persistent asthma, uncomplicated: Secondary | ICD-10-CM | POA: Diagnosis not present

## 2024-10-05 DIAGNOSIS — J3089 Other allergic rhinitis: Secondary | ICD-10-CM | POA: Diagnosis not present

## 2024-10-05 MED ORDER — BREO ELLIPTA 200-25 MCG/ACT IN AEPB: INHALATION_SPRAY | RESPIRATORY_TRACT | 5 refills | Status: AC

## 2024-10-05 NOTE — Patient Instructions (Addendum)
" °  1.  Treat and prevent inflammation of airway:   A. Fluticasone  + azelastine  - 1 spray TOGETHER each nostril 1-2 times per day  B. Breo 200 - 1 inhalation 1 time per day (empty lungs)    2. If needed:   A. Albuterol  - 2 inhalations every 4-6 hours  B. Antihistamine - Zyrtec / Xyzal   C. Nasal saline (no faucet water )  3. Influenza = Tamiflu. Covid = Paxlovid  4. Return to clinic in 4 weeks or earlier if problem "

## 2024-10-05 NOTE — Progress Notes (Signed)
 "  [place] [facility] [place] [facility] [place] [facility] [place] [facility] [place]   Follow-up Note  Referring Provider: Avva, Ravisankar, MD Primary Provider: Avva, Ravisankar, MD Date of Office Visit: 10/05/2024  Subjective:   [name] (DOB: 05/07/1946) is a 78 y.o. male who [facility] in re-evaluation of the following:  HPI: [name] returns to this clinic in evaluation of asthma and allergic rhinitis.  I have never seen him in this clinic and his last visit with Dr. Jeneal was 06 January 2023.  He just recently developed an episode of sinusitis as did his wife at the same time and apparently was given amoxicillin .  This was approximately 12 days ago and he is a lot better at this point in time yet still continues to have a little bit of congestion from his nose.  He is been using his nasal fluticasone  and azelastine  a few times a week and he thinks that that works pretty well.  He has also been using tap water  to wash out his nose.  He has a well.  He has a history of asthma and it appears that when he exerts himself he develops problems with wheezing.  He will use the short acting bronchodilator and it seems to help and he may use it for a few days.  Difficult to say how often this happens but it sounds as though it happens multiple times a year.  His airway status must be interpreted in the context of him receiving systemic steroids 3-4 times per year for his rheumatoid arthritis.  He will usually start at 20 mg a day and able to taper down prednisone  over a week.  Many years ago he was using Remicade  for his rheumatoid arthritis.  Allergies as of 10/05/2024       Reactions   Caffeine Other (See Comments)   Urinary retention and jittery   Codeine Other (See Comments)   Jittery   Iodinated Contrast Media Other (See Comments)   Hard on kidneys. The blue dye that went through my IV.    Pseudoephedrine Other (See Comments)   Urinary retention    Simvastatin Other (See Comments)   Hurt my bones   Penicillins Itching   Chlorpheniramine-pseudoeph    Infliximab     Other reaction(s): ear infections        Medication List    albuterol  (2.5 MG/3ML) 0.083% nebulizer solution Commonly known as: PROVENTIL  Take 3 mLs (2.5 mg total) by nebulization every 4 (four) hours as needed for wheezing or shortness of breath.   albuterol  108 (90 Base) MCG/ACT inhaler Commonly known as: VENTOLIN  HFA INHALE 2 PUFFS EVERY 4 TO 6 HOURS IF NEEDED FOR COUGH OR WHEEZE   allopurinol  100 MG tablet Commonly known as: ZYLOPRIM  2 tablet Orally Once a day   amLODipine 2.5 MG tablet Commonly known as: NORVASC Take 1 tablet by mouth 2 (two) times daily.   Azelastine  HCl 137 MCG/SPRAY Soln Place 1 spray into the nose 2 (two) times daily. Use 2 sprays in each nostril 1-2 times daily   cromolyn 4 % ophthalmic solution Commonly known as: OPTICROM 1 drop 4 (four) times daily.   doxazosin  4 MG tablet Commonly known as: CARDURA  See admin instructions.   fluticasone  50 MCG/ACT nasal spray Commonly known as: FLONASE  Place 2 sprays into both nostrils daily as needed (use for 1-2 weeks at a time.).   furosemide 40 MG tablet Commonly known as: LASIX Take 40 mg by  mouth daily.   HYDROcodone -acetaminophen  10-325 MG tablet Commonly known as: NORCO Take by mouth.   Klor-Con  M10 10 MEQ tablet Generic drug: potassium chloride  1 tablet with food Orally Twice a day   levocetirizine 5 MG tablet Commonly known as: XYZAL    omeprazole  20 MG capsule Commonly known as: PRILOSEC 1 capsule   testosterone  cypionate 200 MG/ML injection Commonly known as: DEPOTESTOSTERONE CYPIONATE Inject 200 mg into the muscle once a week.   triamcinolone  cream 0.5 % Commonly known as: KENALOG    VITAMIN D3 PO Take by mouth daily.    Past Medical History:  Diagnosis Date   Arthritis    Asthma    BPH (benign prostatic hyperplasia)    Complication of anesthesia     PROBLEMS VOIDING AFTER HERNIA REPAIR   GERD (gastroesophageal reflux disease)    Gout    Hyperlipidemia    Hypertension    IBS (irritable bowel syndrome)    Iron deficiency anemia due to chronic blood loss 03/27/2022   Macular degeneration    Pulmonary embolus (HCC) 09/06/12   HOSP AT [facility] AND PLACED ON COUMADIN   Rheumatoid arthritis (HCC)    Seasonal allergies    Urinary retention    HX OF KIDNEY STONES-BILATERAL, HX BPH  -- PT TOLD HIS KIDNEY FUNCTIONS  ELVATED WHILE HOSP DEC 2013 FOR PULMONARY EMBOLUS    Past Surgical History:  Procedure Laterality Date   ADENOIDECTOMY     APPENDECTOMY     CHOLECYSTECTOMY     HERNIA REPAIR  2011   umb   LUMBAR LAMINECTOMY     RIGHT HAND SURGERY 06/30/12     TONSILLECTOMY     TOTAL KNEE ARTHROPLASTY Right 06/17/2013   Procedure: RIGHT TOTAL KNEE ARTHROPLASTY;  Surgeon: Dempsey LULLA Moan, MD;  Location: [facility];  Service: Orthopedics;  Laterality: Right;    Review of systems negative except as noted in HPI / PMHx or noted below:  Review of Systems  Constitutional: Negative.   HENT: Negative.    Eyes: Negative.   Respiratory: Negative.    Cardiovascular: Negative.   Gastrointestinal: Negative.   Genitourinary: Negative.   Musculoskeletal: Negative.   Skin: Negative.   Neurological: Negative.   Endo/Heme/Allergies: Negative.   Psychiatric/Behavioral: Negative.       Objective:   Vitals:   10/05/24 1217  BP: 122/68  Pulse: 80  Resp: 14  Temp: 98.8 F (37.1 C)  SpO2: 95%   Height: 5' 9.5 (176.5 cm)  Weight: 205 lb 12.8 oz (93.4 kg)   Physical Exam Constitutional:      Appearance: He is not diaphoretic.  HENT:     Head: Normocephalic.     Right Ear: Tympanic membrane, ear canal and external ear normal.     Left Ear: Tympanic membrane, ear canal and external ear normal.     Nose: Mucosal edema present. No rhinorrhea.     Mouth/Throat:     Pharynx: Uvula midline. No oropharyngeal exudate.  Eyes:      Conjunctiva/sclera: Conjunctivae normal.  Neck:     Thyroid : No thyromegaly.     Trachea: Trachea normal. No tracheal tenderness or tracheal deviation.  Cardiovascular:     Rate and Rhythm: Normal rate and regular rhythm.     Heart sounds: Normal heart sounds, S1 normal and S2 normal. No murmur heard. Pulmonary:     Effort: No respiratory distress.     Breath sounds: Normal breath sounds. No stridor. No wheezing or rales.  Lymphadenopathy:  Head:     Right side of head: No tonsillar adenopathy.     Left side of head: No tonsillar adenopathy.     Cervical: No cervical adenopathy.  Skin:    Findings: No erythema or rash.     Nails: There is no clubbing.  Neurological:     Mental Status: He is alert.     Diagnostics: Spirometry was performed and demonstrated an FEV1 of 1.45 at 52 % of predicted.FEV1/FVC = 0.55  Assessment and Plan:   1. Not well controlled moderate persistent asthma   2. Perennial allergic rhinitis    1.  Treat and prevent inflammation of airway:   A. Fluticasone  + azelastine  - 1 spray TOGETHER each nostril 1-2 times per day  B. Breo 200 - 1 inhalation 1 time per day (empty lungs)    2. If needed:   A. Albuterol  - 2 inhalations every 4-6 hours  B. Antihistamine - Zyrtec / Xyzal   C. Nasal saline (no faucet water )  3. Influenza = Tamiflu. Covid = Paxlovid  4. Return to clinic in 4 weeks or earlier if problem  [name] appears to have inflammation of his airway.  Will have him consistently use anti-inflammatory agents for both his upper and lower airway as noted above.  Will see what type of result we get in 4 weeks.  He has a selection of agents he can utilize if needed.  I cautioned him about using faucet water  to clean out his head as this puts him at risk for Naegleria infection.  Camellia Denis, MD Allergy / Immunology [facility] "

## 2024-10-07 ENCOUNTER — Other Ambulatory Visit: Payer: Self-pay | Admitting: *Deleted

## 2024-10-07 MED ORDER — ALBUTEROL SULFATE HFA 108 (90 BASE) MCG/ACT IN AERS: INHALATION_SPRAY | RESPIRATORY_TRACT | 1 refills | Status: AC

## 2024-10-10 ENCOUNTER — Encounter: Payer: Self-pay | Admitting: Allergy and Immunology

## 2024-10-11 ENCOUNTER — Ambulatory Visit: Admitting: Family Medicine

## 2024-10-14 ENCOUNTER — Other Ambulatory Visit: Payer: Self-pay | Admitting: *Deleted

## 2024-10-14 ENCOUNTER — Telehealth: Payer: Self-pay | Admitting: *Deleted

## 2024-10-14 DIAGNOSIS — D500 Iron deficiency anemia secondary to blood loss (chronic): Secondary | ICD-10-CM

## 2024-10-14 NOTE — Telephone Encounter (Signed)
 Patient called stating that he is experiencing numbness and tingling and tightness in fingers.  Feels this may be a result of elevated RBCs.  Dr Timmy wants patient to come in for labwork.  Appt made for Monday.  Orders in.

## 2024-10-17 ENCOUNTER — Inpatient Hospital Stay: Attending: Hematology & Oncology

## 2024-10-17 DIAGNOSIS — D509 Iron deficiency anemia, unspecified: Secondary | ICD-10-CM | POA: Insufficient documentation

## 2024-10-17 DIAGNOSIS — D500 Iron deficiency anemia secondary to blood loss (chronic): Secondary | ICD-10-CM

## 2024-10-17 DIAGNOSIS — D751 Secondary polycythemia: Secondary | ICD-10-CM | POA: Insufficient documentation

## 2024-10-17 LAB — CBC WITH DIFFERENTIAL (CANCER CENTER ONLY)
Abs Immature Granulocytes: 0.06 K/uL (ref 0.00–0.07)
Basophils Absolute: 0.1 K/uL (ref 0.0–0.1)
Basophils Relative: 1 %
Eosinophils Absolute: 0.1 K/uL (ref 0.0–0.5)
Eosinophils Relative: 2 %
HCT: 52.2 % — ABNORMAL HIGH (ref 39.0–52.0)
Hemoglobin: 16.6 g/dL (ref 13.0–17.0)
Immature Granulocytes: 1 %
Lymphocytes Relative: 32 %
Lymphs Abs: 2.2 K/uL (ref 0.7–4.0)
MCH: 29.6 pg (ref 26.0–34.0)
MCHC: 31.8 g/dL (ref 30.0–36.0)
MCV: 93 fL (ref 80.0–100.0)
Monocytes Absolute: 0.9 K/uL (ref 0.1–1.0)
Monocytes Relative: 13 %
Neutro Abs: 3.7 K/uL (ref 1.7–7.7)
Neutrophils Relative %: 51 %
Platelet Count: 207 K/uL (ref 150–400)
RBC: 5.61 MIL/uL (ref 4.22–5.81)
RDW: 14.4 % (ref 11.5–15.5)
WBC Count: 7.1 K/uL (ref 4.0–10.5)
nRBC: 0 % (ref 0.0–0.2)

## 2024-10-17 LAB — COMPREHENSIVE METABOLIC PANEL WITH GFR
ALT: 12 U/L (ref 0–44)
AST: 17 U/L (ref 15–41)
Albumin: 4.3 g/dL (ref 3.5–5.0)
Alkaline Phosphatase: 71 U/L (ref 38–126)
Anion gap: 11 (ref 5–15)
BUN: 17 mg/dL (ref 8–23)
CO2: 28 mmol/L (ref 22–32)
Calcium: 9.6 mg/dL (ref 8.9–10.3)
Chloride: 100 mmol/L (ref 98–111)
Creatinine, Ser: 1.88 mg/dL — ABNORMAL HIGH (ref 0.61–1.24)
GFR, Estimated: 36 mL/min — ABNORMAL LOW
Glucose, Bld: 103 mg/dL — ABNORMAL HIGH (ref 70–99)
Potassium: 4.5 mmol/L (ref 3.5–5.1)
Sodium: 139 mmol/L (ref 135–145)
Total Bilirubin: 1.5 mg/dL — ABNORMAL HIGH (ref 0.0–1.2)
Total Protein: 6.6 g/dL (ref 6.5–8.1)

## 2024-10-17 LAB — RETICULOCYTES
Immature Retic Fract: 13.7 % (ref 2.3–15.9)
RBC.: 5.39 MIL/uL (ref 4.22–5.81)
Retic Count, Absolute: 99.2 K/uL (ref 19.0–186.0)
Retic Ct Pct: 1.8 % (ref 0.4–3.1)

## 2024-10-17 LAB — IRON AND IRON BINDING CAPACITY (CC-WL,HP ONLY)
Iron: 78 ug/dL (ref 45–182)
Saturation Ratios: 21 % (ref 17.9–39.5)
TIBC: 374 ug/dL (ref 250–450)
UIBC: 296 ug/dL

## 2024-10-17 LAB — FERRITIN: Ferritin: 27 ng/mL (ref 24–336)

## 2024-10-17 LAB — URIC ACID: Uric Acid, Serum: 5.4 mg/dL (ref 3.7–8.6)

## 2024-11-02 ENCOUNTER — Ambulatory Visit: Admitting: Allergy and Immunology

## 2024-11-29 ENCOUNTER — Ambulatory Visit: Admitting: Allergy

## 2025-01-18 ENCOUNTER — Inpatient Hospital Stay

## 2025-01-25 ENCOUNTER — Inpatient Hospital Stay: Admitting: Hematology & Oncology
